# Patient Record
Sex: Male | Born: 1960 | Race: Black or African American | Hispanic: No | Marital: Married | Smoking: Former smoker
Health system: Southern US, Community
[De-identification: ages and names within clinical notes are randomized; demographics above are authoritative.]

## PROBLEM LIST (undated history)

## (undated) DIAGNOSIS — F411 Generalized anxiety disorder: Secondary | ICD-10-CM

## (undated) DIAGNOSIS — I10 Essential (primary) hypertension: Secondary | ICD-10-CM

## (undated) DIAGNOSIS — J449 Chronic obstructive pulmonary disease, unspecified: Secondary | ICD-10-CM

---

## 2002-03-22 ENCOUNTER — Inpatient Hospital Stay (HOSPITAL_COMMUNITY): Admission: AC | Admit: 2002-03-22 | Discharge: 2002-03-24 | Payer: Self-pay

## 2002-03-22 ENCOUNTER — Encounter: Payer: Self-pay | Admitting: General Surgery

## 2002-03-23 ENCOUNTER — Encounter: Payer: Self-pay | Admitting: General Surgery

## 2006-03-02 ENCOUNTER — Emergency Department (HOSPITAL_COMMUNITY): Admission: EM | Admit: 2006-03-02 | Discharge: 2006-03-02 | Payer: Self-pay | Admitting: Emergency Medicine

## 2007-02-19 ENCOUNTER — Emergency Department (HOSPITAL_COMMUNITY): Admission: EM | Admit: 2007-02-19 | Discharge: 2007-02-19 | Payer: Self-pay | Admitting: Family Medicine

## 2010-03-02 ENCOUNTER — Emergency Department (HOSPITAL_COMMUNITY): Admission: EM | Admit: 2010-03-02 | Discharge: 2010-03-03 | Payer: Self-pay | Admitting: Emergency Medicine
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
## 2016-12-13 ENCOUNTER — Emergency Department (HOSPITAL_COMMUNITY): Payer: Self-pay

## 2016-12-13 ENCOUNTER — Encounter (HOSPITAL_COMMUNITY): Payer: Self-pay | Admitting: Emergency Medicine

## 2016-12-13 DIAGNOSIS — R079 Chest pain, unspecified: Secondary | ICD-10-CM | POA: Insufficient documentation

## 2016-12-13 DIAGNOSIS — F17200 Nicotine dependence, unspecified, uncomplicated: Secondary | ICD-10-CM | POA: Insufficient documentation

## 2016-12-13 LAB — CBC
HCT: 42.4 % (ref 39.0–52.0)
HEMOGLOBIN: 14.6 g/dL (ref 13.0–17.0)
MCH: 28.4 pg (ref 26.0–34.0)
MCHC: 34.4 g/dL (ref 30.0–36.0)
MCV: 82.5 fL (ref 78.0–100.0)
PLATELETS: 304 10*3/uL (ref 150–400)
RBC: 5.14 MIL/uL (ref 4.22–5.81)
RDW: 13.6 % (ref 11.5–15.5)
WBC: 9.3 10*3/uL (ref 4.0–10.5)

## 2016-12-13 LAB — BASIC METABOLIC PANEL
ANION GAP: 9 (ref 5–15)
BUN: 10 mg/dL (ref 6–20)
CALCIUM: 8.6 mg/dL — AB (ref 8.9–10.3)
CO2: 25 mmol/L (ref 22–32)
CREATININE: 0.96 mg/dL (ref 0.61–1.24)
Chloride: 106 mmol/L (ref 101–111)
Glucose, Bld: 101 mg/dL — ABNORMAL HIGH (ref 65–99)
Potassium: 3.7 mmol/L (ref 3.5–5.1)
SODIUM: 140 mmol/L (ref 135–145)

## 2016-12-13 LAB — I-STAT TROPONIN, ED: TROPONIN I, POC: 0.01 ng/mL (ref 0.00–0.08)

## 2016-12-13 NOTE — ED Triage Notes (Signed)
Patient reports central chest pain with SOB and productive cough , denies emesis or diaphoresis , no fever or chills .

## 2016-12-14 ENCOUNTER — Emergency Department (HOSPITAL_COMMUNITY)
Admission: EM | Admit: 2016-12-14 | Discharge: 2016-12-14 | Disposition: A | Discharge status: Home / Self Care | Payer: Self-pay | Attending: Emergency Medicine | Admitting: Emergency Medicine

## 2016-12-14 DIAGNOSIS — R079 Chest pain, unspecified: Secondary | ICD-10-CM

## 2016-12-14 MED ORDER — HYDROCODONE-ACETAMINOPHEN 5-325 MG PO TABS
1.0000 | ORAL_TABLET | Freq: Once | ORAL | Status: AC
  Administered 2016-12-14: 1 via ORAL
  Filled 2016-12-14: qty 1

## 2016-12-14 MED ORDER — CYCLOBENZAPRINE HCL 10 MG PO TABS: 10.0000 mg | ORAL_TABLET | Freq: Three times a day (TID) | ORAL | 0 refills | Status: DC | PRN

## 2016-12-14 MED ORDER — CYCLOBENZAPRINE HCL 10 MG PO TABS
5.0000 mg | ORAL_TABLET | Freq: Once | ORAL | Status: AC
  Administered 2016-12-14: 5 mg via ORAL
  Filled 2016-12-14: qty 1

## 2016-12-14 MED ORDER — IBUPROFEN 400 MG PO TABS: 400.0000 mg | ORAL_TABLET | Freq: Three times a day (TID) | ORAL | 0 refills | Status: DC | PRN

## 2016-12-14 NOTE — ED Provider Notes (Signed)
[facility]-EMERGENCY DEPT Provider Note   CSN: [id number] Arrival date & time: 12/13/16  2206   By signing my name below, I, Clovis Pu, attest that this documentation has been prepared under the direction and in the presence of Azalia Bilis, MD  Electronically Signed: Clovis Pu, ED Scribe. 12/14/16. 3:10 AM.   History   Chief Complaint Chief Complaint  Patient presents with  . Chest Pain   The history is provided by the patient. No language interpreter was used.   HPI Comments:  Franklin Chavez is a 56 y.o. male who presents to the Emergency Department complaining of sudden onset, daily, moderate chest pain which radiates up to the left shoulder and left neck x 3 weeks. His pain is worse upon palpation and when he raises his left arm. He has taken ibuprofen with no relief. Pt denies any recent injury. No other associated symptoms noted.    History reviewed. No pertinent past medical history.  There are no active problems to display for this patient.   History reviewed. No pertinent surgical history.   Home Medications    Prior to Admission medications   Not on File    Family History No family history on file.  Social History Social History  Substance Use Topics  . Smoking status: Current Every Day Smoker  . Smokeless tobacco: Never Used  . Alcohol use No     Allergies   Patient has no known allergies.   Review of Systems Review of Systems 10 systems reviewed and all are negative for acute change except as noted in the HPI.  Physical Exam Updated Vital Signs BP 141/89 (BP Location: Left Arm)   Pulse 64   Temp 98.4 F (36.9 C) (Oral)   Resp 16   SpO2 94%   Physical Exam  Constitutional: He is oriented to person, place, and time. He appears well-developed and well-nourished.  HENT:  Head: Normocephalic and atraumatic.  Eyes: EOM are normal.  Neck: Normal range of motion.  Cardiovascular: Normal rate, regular rhythm, normal heart sounds and intact  distal pulses.   Pulmonary/Chest: Effort normal and breath sounds normal. No respiratory distress.  Abdominal: Soft. He exhibits no distension. There is no tenderness.  Musculoskeletal: Normal range of motion.  Neurological: He is alert and oriented to person, place, and time.  Skin: Skin is warm and dry.  Psychiatric: He has a normal mood and affect. Judgment normal.  Nursing note and vitals reviewed.  ED Treatments / Results  DIAGNOSTIC STUDIES:  Oxygen Saturation is 94% on RA, normal by my interpretation.    COORDINATION OF CARE:  3:09 AM Discussed treatment plan with pt at bedside and pt agreed to plan.  Labs (all labs ordered are listed, but only abnormal results are displayed) Labs Reviewed  BASIC METABOLIC PANEL - Abnormal; Notable for the following:       Result Value   Glucose, Bld 101 (*)    Calcium 8.6 (*)    All other components within normal limits  CBC  I-STAT TROPOININ, ED    EKG  EKG Interpretation None       Radiology Dg Chest 2 View  Result Date: 12/13/2016 CLINICAL DATA:  Cough and shortness of breath EXAM: CHEST  2 VIEW COMPARISON:  None. FINDINGS: The lungs are well inflated. Cardiomediastinal contours are normal. No pneumothorax or pleural effusion. No focal airspace consolidation or pulmonary edema. IMPRESSION: Clear lungs. Electronically Signed   By: Deatra Robinson M.D.   On: 12/13/2016 22:31  Procedures Procedures (including critical care time)  Medications Ordered in ED Medications - No data to display   Initial Impression / Assessment and Plan / ED Course  I have reviewed the triage vital signs and the nursing notes.  Pertinent labs & imaging results that were available during my care of the patient were reviewed by me and considered in my medical decision making (see chart for details).  Clinical Course     Patient's pain seems more muscular and that is worse with movement of his left arm.  Normal left radial pulse.  Doubt ACS.   Doubt PE.  Well-appearing.  Home with muscle relaxants and anti-inflammatories.  Primary care follow-up.  He understands return to the ER for new or worsening symptoms.  Final Clinical Impressions(s) / ED Diagnoses   Final diagnoses:  Chest pain, unspecified type    New Prescriptions New Prescriptions   CYCLOBENZAPRINE (FLEXERIL) 10 MG TABLET    Take 1 tablet (10 mg total) by mouth 3 (three) times daily as needed for muscle spasms.   IBUPROFEN (ADVIL,MOTRIN) 400 MG TABLET    Take 1 tablet (400 mg total) by mouth every 8 (eight) hours as needed.   I personally performed the services described in this documentation, which was scribed in my presence. The recorded information has been reviewed and is accurate.        Azalia BilisKevin Emillie Chasen, MD 12/14/16 [phone]

## 2018-02-20 IMAGING — DX DG CHEST 2V
2 series · 2 of 2 positions shown · non-contrast
Comparison: None.

CLINICAL DATA: Cough and shortness of breath

EXAM:
CHEST  2 VIEW

[chest pa]
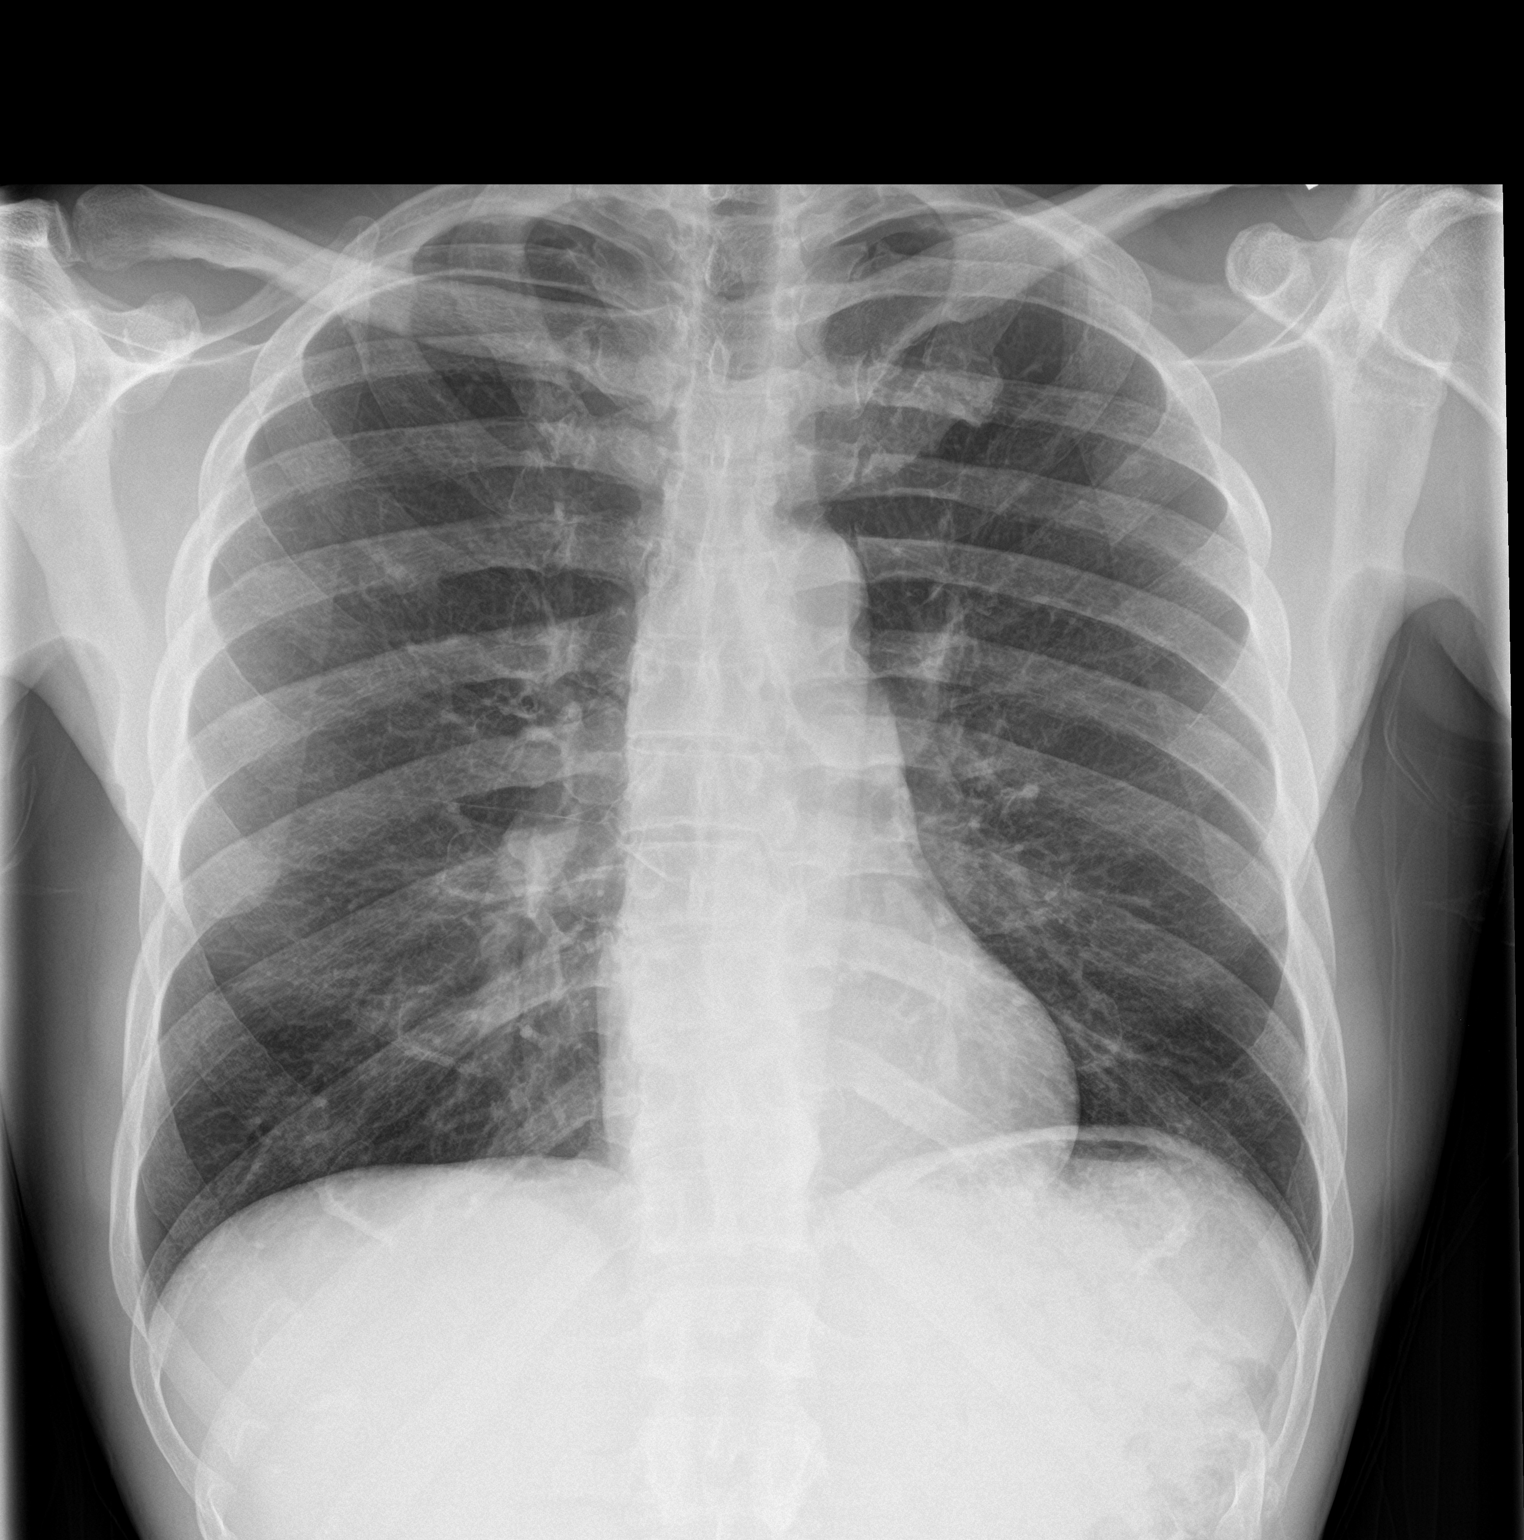

[chest lat]
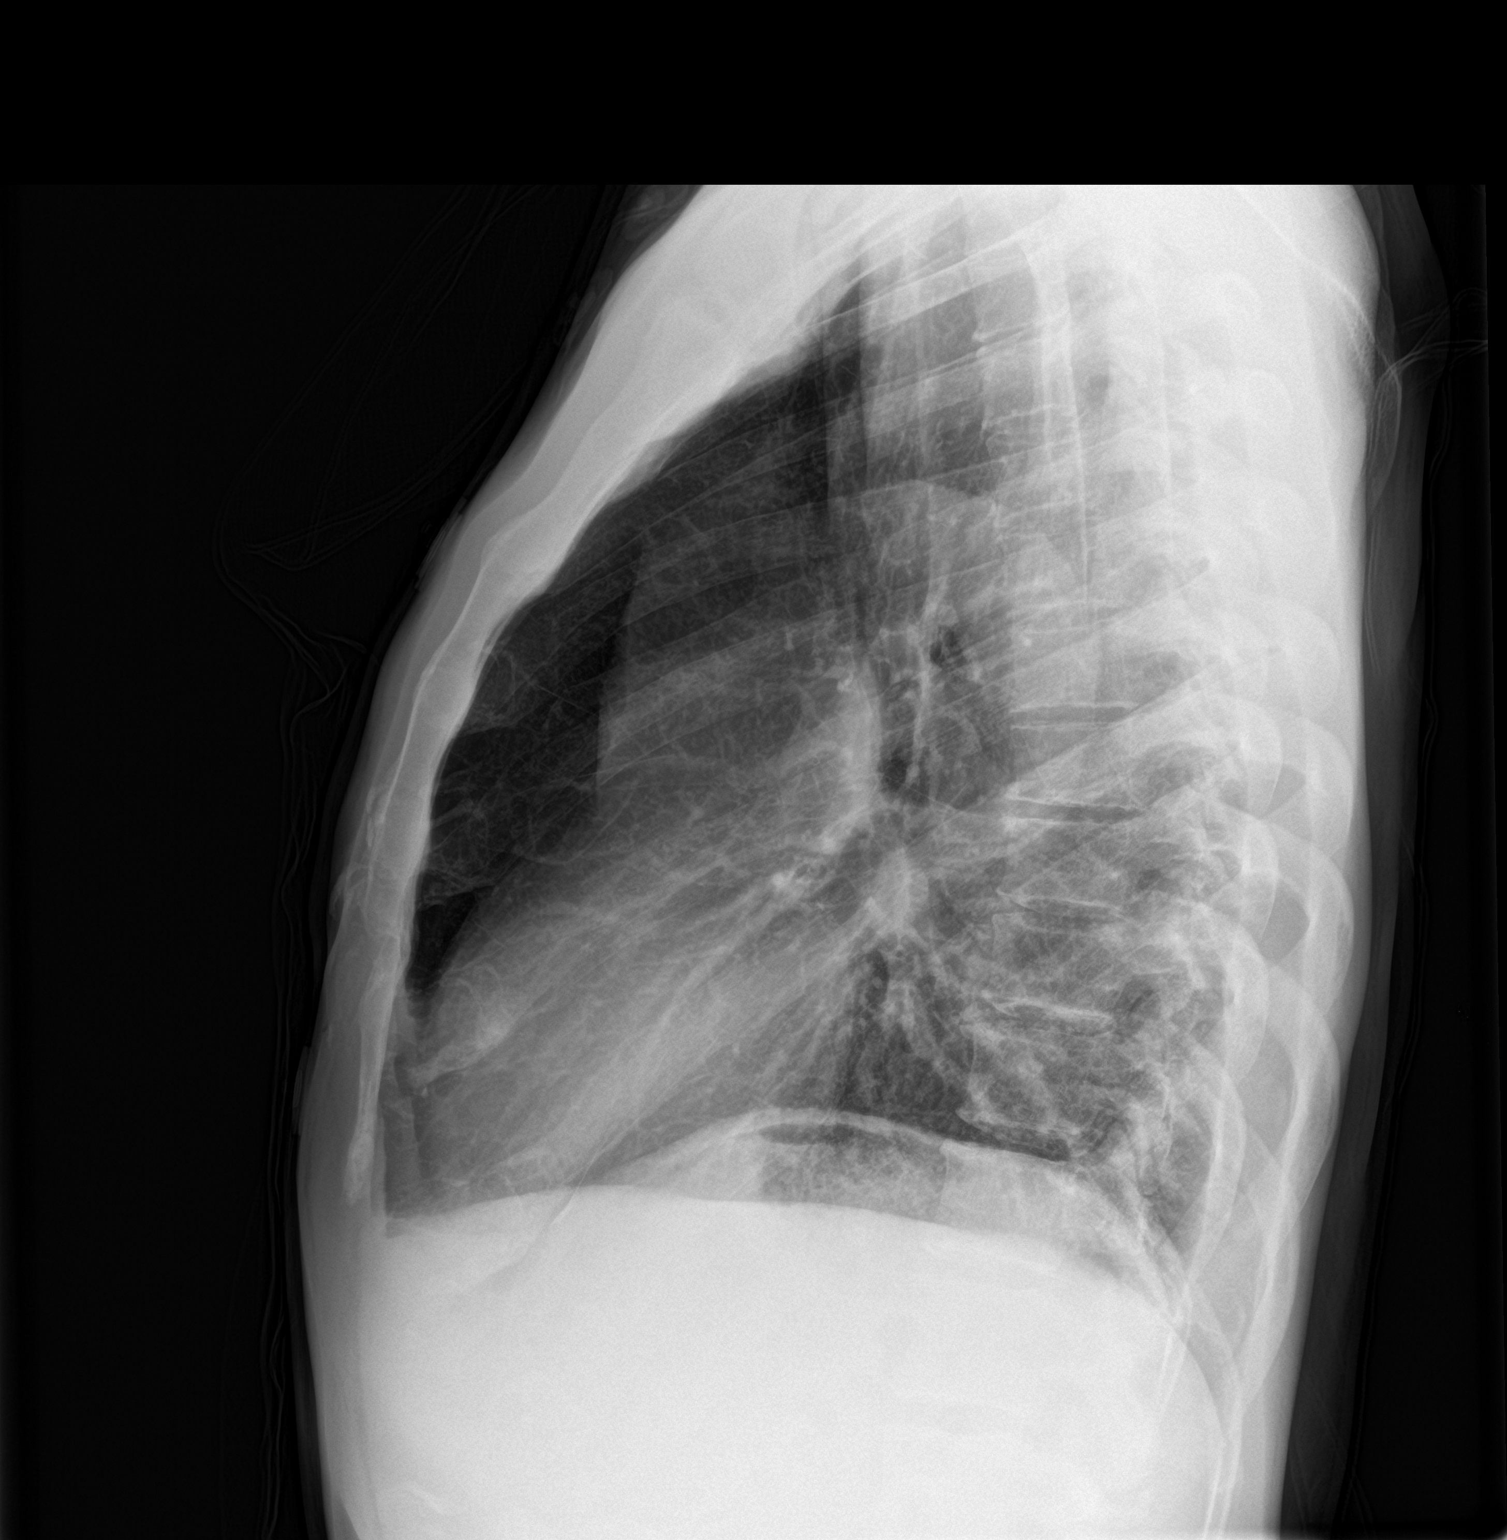

[2 of 2 positions shown; findings below may reference images not displayed]

FINDINGS: The lungs are well inflated. Cardiomediastinal contours are normal.
No pneumothorax or pleural effusion. No focal airspace consolidation
or pulmonary edema.
IMPRESSION: Clear lungs.

## 2018-12-10 ENCOUNTER — Other Ambulatory Visit: Payer: Self-pay

## 2018-12-10 ENCOUNTER — Inpatient Hospital Stay (HOSPITAL_COMMUNITY)
Admission: EM | Admit: 2018-12-10 | Discharge: 2018-12-14 | DRG: 479 | Disposition: A | Discharge status: Home / Self Care | Payer: Self-pay | Attending: Orthopedic Surgery | Admitting: Orthopedic Surgery

## 2018-12-10 ENCOUNTER — Emergency Department (HOSPITAL_COMMUNITY): Payer: Self-pay

## 2018-12-10 DIAGNOSIS — S60453A Superficial foreign body of left middle finger, initial encounter: Secondary | ICD-10-CM

## 2018-12-10 DIAGNOSIS — Z23 Encounter for immunization: Secondary | ICD-10-CM

## 2018-12-10 DIAGNOSIS — K089 Disorder of teeth and supporting structures, unspecified: Secondary | ICD-10-CM

## 2018-12-10 DIAGNOSIS — F17200 Nicotine dependence, unspecified, uncomplicated: Secondary | ICD-10-CM | POA: Diagnosis present

## 2018-12-10 DIAGNOSIS — Z720 Tobacco use: Secondary | ICD-10-CM

## 2018-12-10 DIAGNOSIS — L089 Local infection of the skin and subcutaneous tissue, unspecified: Secondary | ICD-10-CM

## 2018-12-10 DIAGNOSIS — Z716 Tobacco abuse counseling: Secondary | ICD-10-CM

## 2018-12-10 DIAGNOSIS — B9562 Methicillin resistant Staphylococcus aureus infection as the cause of diseases classified elsewhere: Secondary | ICD-10-CM | POA: Diagnosis present

## 2018-12-10 DIAGNOSIS — M868X4 Other osteomyelitis, hand: Principal | ICD-10-CM | POA: Diagnosis present

## 2018-12-10 LAB — CBC WITH DIFFERENTIAL/PLATELET
Abs Immature Granulocytes: 0.04 10*3/uL (ref 0.00–0.07)
Basophils Absolute: 0.1 10*3/uL (ref 0.0–0.1)
Basophils Relative: 0 %
Eosinophils Absolute: 0.6 10*3/uL — ABNORMAL HIGH (ref 0.0–0.5)
Eosinophils Relative: 5 %
HCT: 43.9 % (ref 39.0–52.0)
Hemoglobin: 14.9 g/dL (ref 13.0–17.0)
Immature Granulocytes: 0 %
Lymphocytes Relative: 17 %
Lymphs Abs: 2 10*3/uL (ref 0.7–4.0)
MCH: 28.1 pg (ref 26.0–34.0)
MCHC: 33.9 g/dL (ref 30.0–36.0)
MCV: 82.7 fL (ref 80.0–100.0)
Monocytes Absolute: 0.8 10*3/uL (ref 0.1–1.0)
Monocytes Relative: 7 %
Neutro Abs: 8.5 10*3/uL — ABNORMAL HIGH (ref 1.7–7.7)
Neutrophils Relative %: 71 %
Platelets: 313 10*3/uL (ref 150–400)
RBC: 5.31 MIL/uL (ref 4.22–5.81)
RDW: 14.4 % (ref 11.5–15.5)
WBC: 11.9 10*3/uL — ABNORMAL HIGH (ref 4.0–10.5)
nRBC: 0 % (ref 0.0–0.2)

## 2018-12-10 LAB — C-REACTIVE PROTEIN: CRP: 1.9 mg/dL — ABNORMAL HIGH (ref <1.0)

## 2018-12-10 LAB — BASIC METABOLIC PANEL
Anion gap: 4 — ABNORMAL LOW (ref 5–15)
BUN: 8 mg/dL (ref 6–20)
CO2: 28 mmol/L (ref 22–32)
Calcium: 8.3 mg/dL — ABNORMAL LOW (ref 8.9–10.3)
Chloride: 106 mmol/L (ref 98–111)
Creatinine, Ser: 0.88 mg/dL (ref 0.61–1.24)
GFR calc Af Amer: >60 mL/min (ref >60)
GFR calc non Af Amer: >60 mL/min (ref >60)
Glucose, Bld: 97 mg/dL (ref 70–99)
Potassium: 4.3 mmol/L (ref 3.5–5.1)
Sodium: 138 mmol/L (ref 135–145)

## 2018-12-10 LAB — SEDIMENTATION RATE: Sed Rate: 3 mm/hr (ref 0–16)

## 2018-12-10 MED ORDER — PIPERACILLIN-TAZOBACTAM 3.375 G IVPB
3.3750 g | Freq: Three times a day (TID) | INTRAVENOUS | Status: DC
  Administered 2018-12-11 (×2): 3.375 g via INTRAVENOUS
  Filled 2018-12-10 (×3): qty 50

## 2018-12-10 MED ORDER — VANCOMYCIN HCL 10 G IV SOLR
1500.0000 mg | Freq: Once | INTRAVENOUS | Status: AC
  Administered 2018-12-10: 1500 mg via INTRAVENOUS
  Filled 2018-12-10: qty 1500

## 2018-12-10 MED ORDER — OXYCODONE HCL 5 MG PO TABS
5.0000 mg | ORAL_TABLET | ORAL | Status: DC | PRN
  Administered 2018-12-10 – 2018-12-14 (×22): 10 mg via ORAL
  Filled 2018-12-10 (×22): qty 2

## 2018-12-10 MED ORDER — MORPHINE SULFATE (PF) 2 MG/ML IV SOLN: 1.0000 mg | INTRAVENOUS | Status: DC | PRN

## 2018-12-10 MED ORDER — TETANUS-DIPHTH-ACELL PERTUSSIS 5-2.5-18.5 LF-MCG/0.5 IM SUSP
0.5000 mL | Freq: Once | INTRAMUSCULAR | Status: AC
  Administered 2018-12-10: 0.5 mL via INTRAMUSCULAR
  Filled 2018-12-10: qty 0.5

## 2018-12-10 MED ORDER — METHOCARBAMOL 500 MG PO TABS
500.0000 mg | ORAL_TABLET | Freq: Four times a day (QID) | ORAL | Status: DC | PRN
  Administered 2018-12-11 – 2018-12-13 (×4): 500 mg via ORAL
  Filled 2018-12-10 (×4): qty 1

## 2018-12-10 MED ORDER — METHOCARBAMOL 1000 MG/10ML IJ SOLN
500.0000 mg | Freq: Four times a day (QID) | INTRAVENOUS | Status: DC | PRN
  Filled 2018-12-10: qty 5

## 2018-12-10 MED ORDER — PIPERACILLIN-TAZOBACTAM 3.375 G IVPB 30 MIN
3.3750 g | Freq: Once | INTRAVENOUS | Status: AC
  Administered 2018-12-10: 3.375 g via INTRAVENOUS
  Filled 2018-12-10: qty 50

## 2018-12-10 MED ORDER — VANCOMYCIN HCL 10 G IV SOLR
1500.0000 mg | Freq: Two times a day (BID) | INTRAVENOUS | Status: DC
  Administered 2018-12-11 – 2018-12-14 (×7): 1500 mg via INTRAVENOUS
  Filled 2018-12-10 (×9): qty 1500

## 2018-12-10 MED ORDER — ALPRAZOLAM 0.5 MG PO TABS: 0.5000 mg | ORAL_TABLET | Freq: Four times a day (QID) | ORAL | Status: DC | PRN

## 2018-12-10 MED ORDER — LACTATED RINGERS IV SOLN
INTRAVENOUS | Status: DC
  Administered 2018-12-10 – 2018-12-13 (×2): via INTRAVENOUS

## 2018-12-10 MED ORDER — DOCUSATE SODIUM 100 MG PO CAPS
100.0000 mg | ORAL_CAPSULE | Freq: Two times a day (BID) | ORAL | Status: DC
  Administered 2018-12-10 – 2018-12-11 (×3): 100 mg via ORAL
  Filled 2018-12-10 (×6): qty 1

## 2018-12-10 MED ORDER — HYDROCODONE-ACETAMINOPHEN 5-325 MG PO TABS
1.0000 | ORAL_TABLET | Freq: Once | ORAL | Status: AC
  Administered 2018-12-10: 1 via ORAL
  Filled 2018-12-10: qty 1

## 2018-12-10 MED ORDER — FAMOTIDINE 20 MG PO TABS
20.0000 mg | ORAL_TABLET | Freq: Two times a day (BID) | ORAL | Status: DC | PRN
Start: 2018-12-10 — End: 2018-12-14

## 2018-12-10 MED ORDER — VITAMIN C 500 MG PO TABS
1000.0000 mg | ORAL_TABLET | Freq: Every day | ORAL | Status: DC
  Administered 2018-12-11 – 2018-12-14 (×4): 1000 mg via ORAL
  Filled 2018-12-10 (×4): qty 2

## 2018-12-10 NOTE — ED Notes (Signed)
This RN attempted to call report, will try again in a few.

## 2018-12-10 NOTE — H&P (Signed)
SHAHRIAR BRETTSCHNEIDER is an 58 y.o. male.   Chief Complaint: Left middle finger deep infection HPI: Patient is a 58 year old male who has a greater than 1 week history of a deep infection about the left middle finger.  The patient states that he has tremendous pain and had some type of inoculation or insult occurred to the ulnar border of the DIP region approximately 1 week or greater ago  He denies other injury.  He denies neck back chest or abdominal pain.  X-rays do not show an obvious fracture or bone insult.  The patient has no evidence of erosive change or fracture.  He does have a swollen and very infected looking left middle finger.    No past medical history on file.  No past surgical history on file.  No family history on file. Social History:  reports that he has been smoking. He has never used smokeless tobacco. He reports that he does not drink alcohol or use drugs.  Allergies: No Known Allergies  (Not in a hospital admission)   No results found for this or any previous visit (from the past 48 hour(s)). Dg Finger Middle Left  Result Date: 12/10/2018 CLINICAL DATA:  Left third digit pain and swelling, possible foreign body EXAM: LEFT MIDDLE FINGER 2+V COMPARISON:  None. FINDINGS: Generalized soft tissue swelling in the third digit is noted. No bony erosive changes are seen. No acute fracture or dislocation is noted. By history there is suggestion of a foreign body although no radiopaque density is seen. IMPRESSION: Soft tissue swelling without acute bony abnormality or definitive radiopaque foreign body. Electronically Signed   By: Alcide Clever M.D.   On: 12/10/2018 16:14    Review of Systems  Respiratory: Negative.   Cardiovascular: Negative.   Gastrointestinal: Negative.   Genitourinary: Negative.     Blood pressure (!) 141/94, pulse 71, temperature 98.2 F (36.8 C), temperature source Oral, resp. rate 16, SpO2 94 %. Physical Exam left middle finger deep abscess  swelling necrotic tissue primarily ulnarly.  He has no evidence of instability x-rays are negative.  The patient is alert and oriented in no acute distress. The patient complains of pain in the affected upper extremity.  The patient is noted to have a normal HEENT exam. Lung fields show equal chest expansion and no shortness of breath. Abdomen exam is nontender without distention. Lower extremity examination does not show any fracture dislocation or blood clot symptoms. Pelvis is stable and the neck and back are stable and nontender.  Assessment/Plan Left middle finger deep infection.  Procedure patient was taken to the procedural suite.  He underwent a 15 cc lidocaine without epinephrine block followed by 2 separate scrubs of Hibiclens followed by Betadine scrub and paint.  Once this was complete outlined marks were made and I performed a incision about the left middle finger this was a incision off of the ulnar border about the pulp tissue where there is devitalized and necrotic skin I immediately encountered deep infectious material which was cultured for aerobic and anaerobic culture.  This was irrigation and debridement of a deep abscess.  Following this I then performed evacuation of nonviable tissue.  I then performed partial nail plate removal.  I then debrided the nail bed.  There is a small area of bone encroachment where the periosteum was rather worn.  Although the x-rays did not show an obvious erosive issue the infection was right against the bone and thus I performed a bone biopsy and  cultured this separately as well.  The patient tolerated this nicely.  There were no complications.  We irrigated with greater than a liter of saline and following this packed this open.  He will be treated presumptively on IV antibiotics in the form of vancomycin and Zosyn while we await final cultures.  I will plan for PT hydrotherapy and instruct him on wet-to-dry dressing changes.  I would try to  treat him presumptively for a 6-week course of care on oral antibiotics if appropriate.  All questions have been addressed.  Oletta Cohn III, MD 12/10/2018, 5:17 PM

## 2018-12-10 NOTE — ED Provider Notes (Signed)
[place] EMERGENCY DEPARTMENT Provider Note   CSN: [id number] Arrival date & time: 12/10/18  1403     History   Chief Complaint Chief Complaint  Patient presents with  . Hand Pain    HPI HURBERT DURAN is a 58 y.o. male with no significant past medical history presenting for evaluation of acute onset, progressively worsening left third digit pain and swelling for 1 week.  He states he thinks he may have a splinter in there but is unsure how he may have acquired this and denies any known trauma.  He states that he has tried to "dig it out "with a metal pin unsuccessfully.  Pain is constant, throbbing, worsens with movement and palpation.  Also endorses intermittent numbness and tingling of the digit secondary to swelling.  Denies fevers, vomiting, diaphoresis.  He is unsure if his tetanus is up-to-date.  The history is provided by the patient.    No past medical history on file.  Patient Active Problem List   Diagnosis Date Noted  . Foreign body of left middle finger with infection 12/10/2018    No past surgical history on file.      Home Medications    Prior to Admission medications   Medication Sig Start Date End Date Taking? Authorizing Provider  cyclobenzaprine (FLEXERIL) 10 MG tablet Take 1 tablet (10 mg total) by mouth 3 (three) times daily as needed for muscle spasms. 12/14/16   Jola Schmidt, MD  ibuprofen (ADVIL,MOTRIN) 400 MG tablet Take 1 tablet (400 mg total) by mouth every 8 (eight) hours as needed. 12/14/16   Jola Schmidt, MD    Family History No family history on file.  Social History Social History   Tobacco Use  . Smoking status: Current Every Day Smoker  . Smokeless tobacco: Never Used  Substance Use Topics  . Alcohol use: No  . Drug use: No     Allergies   Patient has no known allergies.   Review of Systems Review of Systems  Constitutional: Negative for chills and fever.  Gastrointestinal: Negative for nausea and  vomiting.  Musculoskeletal: Positive for arthralgias.  Neurological: Positive for numbness. Negative for weakness.     Physical Exam Updated Vital Signs BP (!) 141/94 (BP Location: Right Arm)   Pulse 71   Temp 98.2 F (36.8 C) (Oral)   Resp 16   Ht _0  (1.905 m)   Wt 79.4 kg   SpO2 94%   BMI 21.87 kg/m   Physical Exam Vitals signs and nursing note reviewed.  Constitutional:      General: He is not in acute distress.    Appearance: He is well-developed.  HENT:     Head: Normocephalic and atraumatic.  Eyes:     General:        Right eye: No discharge.        Left eye: No discharge.     Conjunctiva/sclera: Conjunctivae normal.  Neck:     Vascular: No JVD.     Trachea: No tracheal deviation.  Cardiovascular:     Rate and Rhythm: Normal rate.     Pulses: Normal pulses.     Comments: 2+ radial pulses bilaterally Pulmonary:     Effort: Pulmonary effort is normal.  Abdominal:     General: There is no distension.  Musculoskeletal:        General: Swelling and tenderness present.     Comments: See below images.  Diffuse tenderness to palpation of the left third  digit, worse distally.  He has a wound to the distal fingertip.  No involvement of the nail.  He has pain with passive extension of the digit.  No crepitus noted.  5/5 strength of bilateral wrist and digits with flexion and extension against resistance however somewhat limited on examination of the left third digit due to pain.  Skin:    General: Skin is warm and dry.     Capillary Refill: Capillary refill takes less than 2 seconds.     Findings: No erythema.  Neurological:     Mental Status: He is alert.     Comments: Fluent speech, no facial droop, slightly altered sensation to soft touch of the left third digit as compared to the other digits.  Psychiatric:        Behavior: Behavior normal.          ED Treatments / Results  Labs (all labs ordered are listed, but only abnormal results are  displayed) Labs Reviewed  CBC WITH DIFFERENTIAL/PLATELET - Abnormal; Notable for the following components:      Result Value   WBC 11.9 (*)    Neutro Abs 8.5 (*)    Eosinophils Absolute 0.6 (*)    All other components within normal limits  BASIC METABOLIC PANEL - Abnormal; Notable for the following components:   Calcium 8.3 (*)    Anion gap 4 (*)    All other components within normal limits  AEROBIC/ANAEROBIC CULTURE (SURGICAL/DEEP WOUND)  AEROBIC CULTURE (SUPERFICIAL SPECIMEN)  SEDIMENTATION RATE  C-REACTIVE PROTEIN  HIV ANTIBODY (ROUTINE TESTING W REFLEX)    EKG None  Radiology Dg Finger Middle Left  Result Date: 12/10/2018 CLINICAL DATA:  Left third digit pain and swelling, possible foreign body EXAM: LEFT MIDDLE FINGER 2+V COMPARISON:  None. FINDINGS: Generalized soft tissue swelling in the third digit is noted. No bony erosive changes are seen. No acute fracture or dislocation is noted. By history there is suggestion of a foreign body although no radiopaque density is seen. IMPRESSION: Soft tissue swelling without acute bony abnormality or definitive radiopaque foreign body. Electronically Signed   By: Inez Catalina M.D.   On: 12/10/2018 16:14    Procedures Procedures (including critical care time)  Medications Ordered in ED Medications  lactated ringers infusion (has no administration in time range)  oxyCODONE (Oxy IR/ROXICODONE) immediate release tablet 5-10 mg (has no administration in time range)  morphine 2 MG/ML injection 1 mg (has no administration in time range)  ALPRAZolam (XANAX) tablet 0.5 mg (has no administration in time range)  famotidine (PEPCID) tablet 20 mg (has no administration in time range)  vitamin C (ASCORBIC ACID) tablet 1,000 mg (has no administration in time range)  methocarbamol (ROBAXIN) tablet 500 mg (has no administration in time range)    Or  methocarbamol (ROBAXIN) 500 mg in dextrose 5 % 50 mL IVPB (has no administration in time range)   docusate sodium (COLACE) capsule 100 mg (has no administration in time range)  piperacillin-tazobactam (ZOSYN) IVPB 3.375 g (has no administration in time range)  vancomycin (VANCOCIN) 1,500 mg in sodium chloride 0.9 % 500 mL IVPB (has no administration in time range)  Tdap (BOOSTRIX) injection 0.5 mL (0.5 mLs Intramuscular Given 12/10/18 1544)  HYDROcodone-acetaminophen (NORCO/VICODIN) 5-325 MG per tablet 1 tablet (1 tablet Oral Given 12/10/18 1544)     Initial Impression / Assessment and Plan / ED Course  I have reviewed the triage vital signs and the nursing notes.  Pertinent labs & imaging results that  were available during my care of the patient were reviewed by me and considered in my medical decision making (see chart for details).     Patient presenting for evaluation of pain and swelling to the left third digit for 1 week.  He is afebrile, mildly hypertensive.  He is nontoxic in appearance.  Slightly altered sensation to soft touch of the digit.  Radiographs show soft tissue swelling without evidence of acute bony abnormality or radiopaque foreign body.  Differential includes flexor tenosynovitis, feeling, paronychia, osteomyelitis.  Spoke with Hilbert Odor with orthopedics who will follow-up and assess the patient.  Patient seen and evaluate by Dr. Amedeo Plenty who performed bedside I&D.  He has concern for osteomyelitis.  Will obtain lab work and plan for admission for further evaluation. Wound culture obtained.  Lab work significant for leukocytosis, no metabolic derangements. Awaiting ESR/CRP.   Final Clinical Impressions(s) / ED Diagnoses   Final diagnoses:  Finger infection    ED Discharge Orders    None       Debroah Baller 12/10/18 1754    Margette Fast, MD 12/10/18 2047

## 2018-12-10 NOTE — ED Notes (Signed)
Patient transported to X-ray 

## 2018-12-10 NOTE — ED Notes (Signed)
Pt given turkey sandwich and sprite.  

## 2018-12-10 NOTE — ED Triage Notes (Signed)
Pt has pain and swelling in left middle finger appears to have FB in finger beside nail bed.

## 2018-12-10 NOTE — Progress Notes (Signed)
Pharmacy Antibiotic Note  Franklin Chavez is a 58 y.o. male admitted on 12/10/2018 with abscess.  Pharmacy has been consulted for vancomycin/zosyn dosing.  Presenting with L middle finger deep infection. During I&D, infection appeared to be right against bone and bone biopsy/culture was performed. WBC 11.9, sed rate 3, CRP 1.9. Scr 0.88 (CrCl >100 mL/min).  Plan: Vancomycin 1500 mg IV every 12 hours.  Goal trough 15-20 mcg/mL. Zosyn 3.375g IV q8h (4 hour infusion). Monitor clinical pic, cx results, and levels as appropriate  Height: 6\' 3"  (190.5 cm) Weight: 175 lb (79.4 kg) IBW/kg (Calculated) : 84.5  Temp (24hrs), Avg:98.2 F (36.8 C), Min:98.2 F (36.8 C), Max:98.2 F (36.8 C)  Recent Labs  Lab 12/10/18 1703  WBC 11.9*    CrCl cannot be calculated (Patient's most recent lab result is older than the maximum 21 days allowed.).    No Known Allergies  Antimicrobials this admission: Vancomycin 1/7 >>  Zosyn 1/7 >>   Dose adjustments this admission: N/A  Microbiology results: 1/7 Wound Cx: sent  Thank you for allowing pharmacy to be a part of this patient's care.  Sherron Monday, PharmD, BCCCP Clinical Pharmacist  Pager: [phone] Phone: [phone] 12/10/2018 5:36 PM

## 2018-12-11 ENCOUNTER — Encounter (HOSPITAL_COMMUNITY): Payer: Self-pay | Admitting: *Deleted

## 2018-12-11 LAB — HIV ANTIBODY (ROUTINE TESTING W REFLEX): HIV Screen 4th Generation wRfx: NONREACTIVE

## 2018-12-11 NOTE — Progress Notes (Signed)
Physical Therapy Wound Treatment Patient Details  Name: SHERMAR FRIEDLAND MRN: [id number] Date of Birth: 1961/04/22  Today's Date: 12/11/2018 Time: 4970-2637 Time Calculation (min): 50 min  Subjective  Subjective: "That wasn't too bad." Patient and Family Stated Goals: get finger better Prior Treatments: I&D in ED  Pain Score: Pain Score: 7 Premedicated  Wound Assessment  Wound / Incision (Open or Dehisced) 12/11/18 Incision - Open Finger (Comment which one) Left lt middle finger (Active)  Wound Image   12/11/2018 11:00 AM  Dressing Type Gauze (Comment);Compression wrap;Moist to moist 12/11/2018 11:00 AM  Dressing Changed Changed 12/11/2018 11:00 AM  Dressing Status Clean;Dry;Intact 12/11/2018 11:00 AM  Dressing Change Frequency Daily 12/11/2018 11:00 AM  Site / Wound Assessment Red;Pink;Yellow;Painful 12/11/2018 11:00 AM  % Wound base Red or Granulating 50% 12/11/2018 11:00 AM  % Wound base Yellow/Fibrinous Exudate 50% 12/11/2018 11:00 AM  % Wound base Black/Eschar 0% 12/11/2018 11:00 AM  % Wound base Other/Granulation Tissue (Comment) 0% 12/11/2018 11:00 AM  Peri-wound Assessment Edema 12/11/2018 11:00 AM  Wound Length (cm) 1.5 cm 12/11/2018 11:00 AM  Wound Width (cm) 0.5 cm 12/11/2018 11:00 AM  Wound Depth (cm) 0.3 cm 12/11/2018 11:00 AM  Wound Volume (cm^3) 0.22 cm^3 12/11/2018 11:00 AM  Wound Surface Area (cm^2) 0.75 cm^2 12/11/2018 11:00 AM  Margins Unattached edges (unapproximated) 12/11/2018 11:00 AM  Closure None 12/11/2018 11:00 AM  Drainage Amount Moderate 12/11/2018 11:00 AM  Drainage Description Sanguineous 12/11/2018 11:00 AM  Non-staged Wound Description Full thickness 12/11/2018 11:00 AM  Treatment Hydrotherapy (Pulse lavage);Packing (Saline gauze) 12/11/2018 11:00 AM   Hydrotherapy Pulsed lavage therapy - wound location: lt middle finger Pulsed Lavage with Suction (psi): 4 psi Pulsed Lavage with Suction - Normal Saline Used: 500 mL Pulsed Lavage Tip: Tip with splash shield   Wound Assessment and Plan    Wound Therapy - Assess/Plan/Recommendations Wound Therapy - Clinical Statement: Pt presents to hydrotherapy with open wound on lt middle finger after bedside debridement by hand surgery. Hydrotherapy to decr bioburden and facilitate removal of nonviable tissue.  Wound Therapy - Functional Problem List: decr use of lt hand Factors Delaying/Impairing Wound Healing: Infection - systemic/local;Tobacco use Hydrotherapy Plan: Debridement;Dressing change;Patient/family education;Pulsatile lavage with suction Wound Therapy - Frequency: Other (comment)(daily) Wound Therapy - Follow Up Recommendations: Other (comment)(per hand surgeon recommendations) Wound Plan: See above  Wound Therapy Goals- Improve the function of patient's integumentary system by progressing the wound(s) through the phases of wound healing (inflammation - proliferation - remodeling) by: Decrease Necrotic Tissue to: 30 Decrease Necrotic Tissue - Progress: Goal set today Increase Granulation Tissue to: 70 Increase Granulation Tissue - Progress: Goal set today Goals/treatment plan/discharge plan were made with and agreed upon by patient/family: Yes Time For Goal Achievement: 7 days Wound Therapy - Potential for Goals: Fair  Goals will be updated until maximal potential achieved or discharge criteria met.  Discharge criteria: when goals achieved, discharge from hospital, MD decision/surgical intervention, no progress towards goals, refusal/missing three consecutive treatments without notification or medical reason.  GP     Shary Decamp [place] 12/11/2018, 12:19 PM [place] Pager [phone] Office [phone]

## 2018-12-11 NOTE — Progress Notes (Signed)
Pharmacy Antibiotic Note  Franklin Chavez is a 58 y.o. male admitted on 12/10/2018 with abscess.  He was started on vanc/zosyn. His deep culture has come back with staph aureus with sens pending. D/w Dr. Amanda Pea to stop zosyn and cont vanc until sens are back for oral therapy.   Plan: Continue vanc 1.5g IV q12 for now Dc zosyn  Height: 6\' 3"  (190.5 cm) Weight: 175 lb 0.7 oz (79.4 kg) IBW/kg (Calculated) : 84.5  Temp (24hrs), Avg:98.4 F (36.9 C), Min:98 F (36.7 C), Max:98.7 F (37.1 C)  Recent Labs  Lab 12/10/18 1703  WBC 11.9*  CREATININE 0.88    Estimated Creatinine Clearance: 105.3 mL/min (by C-G formula based on SCr of 0.88 mg/dL).    No Known Allergies  Antimicrobials this admission: Vancomycin 1/7 >>  Zosyn 1/7 >> 1/8  Dose adjustments this admission: N/A  Microbiology results: 1/7 Wound Cx: staph aureus  Franklin Chavez, PharmD, BCIDP, AAHIVP, CPP Infectious Disease Pharmacist 12/11/2018 2:05 PM

## 2018-12-11 NOTE — Progress Notes (Signed)
Patient ID: KADEYN TSUNG, male   DOB: [birth year], 58 y.o.   MRN: [id number] Patient is alert and oriented.  Patient had whirlpool today.  There is no complicating features today.  Overall I think the patient is quite stable.    We will tailor his antibiotics to simply vancomycin given the early culture results.  We have discussed with the patient the issues regarding their infection to the extremity. We will continue antibiotics and await culture results. Often times it will take 3-5 days for cultures to become final. During this time we will typically have the patient on intravenous antibiotics until we can find a parenteral route of antibiotic regime specific for the bacteria or organism isolated. We have discussed with the patient the need for daily irrigation and debridement as well as therapy to the area. We have discussed with the patient the necessity of range of motion to the involved joints as discussed today. We have discussed with the patient the unpredictability of infections at times. We'll continue to work towards good pain control and restoration of function. The patient understands the need for meticulous wound care and the necessity of proper followup.  The possible complications of stiffness (loss of motion), resistant infection, possible deep bone infection, possible chronic pain issues, possible need for multiple surgeries and even amputation.  With this in mind the patient understands our goal is to eradicate the infection to quiesence. We will continue to work towards these goals.   The patient is alert and oriented in no acute distress. The patient complains of pain in the affected upper extremity.  The patient is noted to have a normal HEENT exam. Lung fields show equal chest expansion and no shortness of breath. Abdomen exam is nontender without distention. Lower extremity examination does not show any fracture dislocation or blood clot symptoms. Pelvis is stable and the  neck and back are stable and nontender.   Our plan will be whirlpool Thursday and Friday.  Will await the cultures and sensitivities and hopefully home Friday afternoon.  Patient is aware of all issues.  Shimshon Narula MD

## 2018-12-11 NOTE — Care Management Note (Signed)
Case Management Note  Patient Details  Name: Franklin Chavez MRN: [id number] Date of Birth: 1961-01-09  Subjective/Objective:    Pt admitted with lt middle finger infection. He is from home with a roommate.  DME: none Insurance: none PCP: none                Action/Plan: CM met with the patient and he is interested in getting into one of the [employer]. CM was able to get an appt at the [facility]. Information on the AVS. Pt provided information on using [facility] pharmacy for assist with his medications. CM following for further d/c needs.   Expected Discharge Date:                  Expected Discharge Plan:     In-House Referral:     Discharge planning Services  CM Consult, [facility], Medication Assistance  Post Acute Care Choice:    Choice offered to:     DME Arranged:    DME Agency:     HH Arranged:    HH Agency:     Status of Service:  In process, will continue to follow  If discussed at Long Length of Stay Meetings, dates discussed:    Additional Comments:  Pollie Friar, RN 12/11/2018, 12:34 PM

## 2018-12-12 NOTE — Progress Notes (Signed)
Physical Therapy Wound Treatment Patient Details  Name: Franklin Chavez MRN: [id number] Date of Birth: 04/07/1961  Today's Date: 12/12/2018 Time: 4098-1191 Time Calculation (min): 17 min  Subjective  Subjective: Pt reports he thinks swelling is getting better Patient and Family Stated Goals: get finger better Prior Treatments: I&D in ED  Pain Score: Pain Score: 7   Wound Assessment  Wound / Incision (Open or Dehisced) 12/11/18 Incision - Open Finger (Comment which one) Left lt middle finger (Active)  Dressing Type Gauze (Comment);Compression wrap;Moist to moist 12/12/2018 10:57 AM  Dressing Changed Changed 12/12/2018 10:57 AM  Dressing Status Clean;Dry;Intact 12/12/2018 10:57 AM  Dressing Change Frequency Daily 12/12/2018 10:57 AM  Site / Wound Assessment Red;Pink;Yellow;Painful 12/12/2018 10:57 AM  % Wound base Red or Granulating 70% 12/12/2018 10:57 AM  % Wound base Yellow/Fibrinous Exudate 30% 12/12/2018 10:57 AM  % Wound base Black/Eschar 0% 12/12/2018 10:57 AM  % Wound base Other/Granulation Tissue (Comment) 0% 12/12/2018 10:57 AM  Peri-wound Assessment Edema 12/12/2018 10:57 AM  Wound Length (cm) 1.5 cm 12/11/2018 11:00 AM  Wound Width (cm) 0.5 cm 12/11/2018 11:00 AM  Wound Depth (cm) 0.3 cm 12/11/2018 11:00 AM  Wound Volume (cm^3) 0.22 cm^3 12/11/2018 11:00 AM  Wound Surface Area (cm^2) 0.75 cm^2 12/11/2018 11:00 AM  Margins Unattached edges (unapproximated) 12/12/2018 10:57 AM  Closure None 12/12/2018 10:57 AM  Drainage Amount Minimal 12/12/2018 10:57 AM  Drainage Description Sanguineous 12/12/2018 10:57 AM  Non-staged Wound Description Full thickness 12/12/2018 10:57 AM  Treatment Hydrotherapy (Pulse lavage);Packing (Saline gauze) 12/12/2018 10:57 AM   Hydrotherapy Pulsed lavage therapy - wound location: lt middle finger Pulsed Lavage with Suction (psi): 4 psi Pulsed Lavage with Suction - Normal Saline Used: 500 mL Pulsed Lavage Tip: Tip with splash shield   Wound Assessment and Plan  Wound Therapy -  Assess/Plan/Recommendations Wound Therapy - Clinical Statement: Pt continues to tolerate hydrotherapy well. Continue with hydrotherapy to decr bioburden.  Wound Therapy - Functional Problem List: decr use of lt hand Factors Delaying/Impairing Wound Healing: Infection - systemic/local;Tobacco use Hydrotherapy Plan: Debridement;Dressing change;Patient/family education;Pulsatile lavage with suction Wound Therapy - Frequency: Other (comment)(daily) Wound Therapy - Follow Up Recommendations: Other (comment)(per hand surgeon recommendations) Wound Plan: See above  Wound Therapy Goals- Improve the function of patient's integumentary system by progressing the wound(s) through the phases of wound healing (inflammation - proliferation - remodeling) by: Decrease Necrotic Tissue to: 30 Decrease Necrotic Tissue - Progress: Progressing toward goal Increase Granulation Tissue to: 70 Increase Granulation Tissue - Progress: Progressing toward goal  Goals will be updated until maximal potential achieved or discharge criteria met.  Discharge criteria: when goals achieved, discharge from hospital, MD decision/surgical intervention, no progress towards goals, refusal/missing three consecutive treatments without notification or medical reason.  GP     Shary Decamp Maycok 12/12/2018, 10:59 AM [place] Pager [phone] Office [phone]

## 2018-12-12 NOTE — Progress Notes (Signed)
Patient ID: Franklin Chavez, male   DOB: [birth year], 58 y.o.   MRN: [id number] Stable Await final Cx and sensitivities VSS AF Continue hydrotherapy and teach wet to dry dressing changes Patient understands Greer Ee MD

## 2018-12-13 ENCOUNTER — Encounter (HOSPITAL_COMMUNITY): Payer: Self-pay | Admitting: Internal Medicine

## 2018-12-13 DIAGNOSIS — Z720 Tobacco use: Secondary | ICD-10-CM

## 2018-12-13 DIAGNOSIS — F17200 Nicotine dependence, unspecified, uncomplicated: Secondary | ICD-10-CM

## 2018-12-13 DIAGNOSIS — K089 Disorder of teeth and supporting structures, unspecified: Secondary | ICD-10-CM

## 2018-12-13 HISTORY — DX: Nicotine dependence, unspecified, uncomplicated: F17.200

## 2018-12-13 HISTORY — DX: Disorder of teeth and supporting structures, unspecified: K08.9

## 2018-12-13 MED ORDER — MUPIROCIN 2 % EX OINT
1.0000 "application " | TOPICAL_OINTMENT | Freq: Two times a day (BID) | CUTANEOUS | Status: DC
  Administered 2018-12-13: 1 via NASAL
  Filled 2018-12-13: qty 22

## 2018-12-13 MED ORDER — CHLORHEXIDINE GLUCONATE CLOTH 2 % EX PADS: 6.0000 | MEDICATED_PAD | Freq: Every day | CUTANEOUS | Status: DC

## 2018-12-13 NOTE — Progress Notes (Signed)
Physical Therapy Wound Treatment Patient Details  Name: Franklin Chavez MRN: [id number] Date of Birth: 1961/07/06  Today's Date: 12/13/2018 Time: 0900-0930 Time Calculation (min): 30 min  Subjective  Subjective: Pt states, "I think it's getting better." Patient and Family Stated Goals: get finger better Prior Treatments: I&D in ED  Pain Score: Pain Score: 7  Wound Assessment  Wound / Incision (Open or Dehisced) 12/11/18 Incision - Open Finger (Comment which one) Left lt middle finger (Active)  Wound Image   12/11/2018 11:00 AM  Dressing Type Gauze (Comment);Compression wrap;Moist to moist 12/13/2018 11:39 AM  Dressing Changed New 12/13/2018 11:39 AM  Dressing Status Clean;Dry;Intact 12/13/2018 11:39 AM  Dressing Change Frequency Daily 12/13/2018 11:39 AM  Site / Wound Assessment Red;Pink;Yellow;Painful 12/13/2018 11:39 AM  % Wound base Red or Granulating 70% 12/13/2018 11:39 AM  % Wound base Yellow/Fibrinous Exudate 30% 12/13/2018 11:39 AM  % Wound base Black/Eschar 0% 12/13/2018 11:39 AM  % Wound base Other/Granulation Tissue (Comment) 0% 12/13/2018 11:39 AM  Peri-wound Assessment Edema 12/13/2018 11:39 AM  Wound Length (cm) 1.5 cm 12/13/2018 11:39 AM  Wound Width (cm) 0.5 cm 12/13/2018 11:39 AM  Wound Depth (cm) 0.3 cm 12/13/2018 11:39 AM  Wound Volume (cm^3) 0.22 cm^3 12/13/2018 11:39 AM  Wound Surface Area (cm^2) 0.75 cm^2 12/13/2018 11:39 AM  Margins Unattached edges (unapproximated) 12/13/2018 11:39 AM  Closure None 12/13/2018 11:39 AM  Drainage Amount Minimal 12/13/2018 11:39 AM  Drainage Description Sanguineous 12/13/2018 11:39 AM  Non-staged Wound Description Full thickness 12/13/2018 11:39 AM  Treatment Hydrotherapy (Pulse lavage);Packing (Saline gauze) 12/13/2018 11:39 AM   Hydrotherapy Pulsed lavage therapy - wound location: lt middle finger Pulsed Lavage with Suction (psi): 4 psi Pulsed Lavage with Suction - Normal Saline Used: 500 mL Pulsed Lavage Tip: Tip with splash shield    Wound Assessment and Plan  Wound Therapy - Assess/Plan/Recommendations Wound Therapy - Clinical Statement: Pt continues to tolerate hydrotherapy well. Continue with hydrotherapy to decr bioburden.  Wound Therapy - Functional Problem List: decr use of lt hand Factors Delaying/Impairing Wound Healing: Infection - systemic/local;Tobacco use Hydrotherapy Plan: Debridement;Dressing change;Patient/family education;Pulsatile lavage with suction Wound Therapy - Frequency: Other (comment)(daily) Wound Therapy - Follow Up Recommendations: Other (comment)(per hand surgeon recommendations) Wound Plan: See above  Wound Therapy Goals- Improve the function of patient's integumentary system by progressing the wound(s) through the phases of wound healing (inflammation - proliferation - remodeling) by: Decrease Necrotic Tissue to: 30 Decrease Necrotic Tissue - Progress: Progressing toward goal Increase Granulation Tissue to: 70 Increase Granulation Tissue - Progress: Progressing toward goal Goals/treatment plan/discharge plan were made with and agreed upon by patient/family: Yes Time For Goal Achievement: 7 days Wound Therapy - Potential for Goals: Fair  Goals will be updated until maximal potential achieved or discharge criteria met.  Discharge criteria: when goals achieved, discharge from hospital, MD decision/surgical intervention, no progress towards goals, refusal/missing three consecutive treatments without notification or medical reason.  GP     Ellamae Sia, PT, DPT Acute Rehabilitation Services Pager [phone] Office [phone]   Willy Eddy 12/13/2018, 11:49 AM

## 2018-12-13 NOTE — Consult Note (Signed)
[facility]    Date of Admission:  12/10/2018   Total days of antibiotics 4        Day 4 Vancomycin               Reason for Consult: Left Third Digit MRSA Soft tissue infection with periosteum involvement    Referring Provider: Dominica SeverinWilliam Gramig, MD Primary Care Provider: None Listed  Assessment: Franklin Chavez presented with soft tissue infection of left third digit, surgically explored and drained by orthopedic with cultures growing MRSA. Periosteum of his phalanx was noted to be eroded, this was sent for culture and is growing MRSA as well. Patient has undergone daily irrigation and debridement. Organism is sensitive to doxycycline.  Plan:  1. 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 Tobacco Use - Smoking cessation discussed   Principal Problem:   Foreign body of left middle finger with infection Active Problems:   Tobacco use disorder   Poor dentition  Scheduled Meds: . Chlorhexidine Gluconate Cloth  6 each Topical Q0600  . docusate sodium  100 mg Oral BID  . mupirocin ointment  1 application Nasal BID  . vitamin C  1,000 mg Oral Daily   Continuous Infusions: . lactated ringers 75 mL/hr at 12/13/18 0225  . methocarbamol (ROBAXIN) IV    . vancomycin 1,500 mg (12/13/18 0905)   PRN Meds:.ALPRAZolam, famotidine, methocarbamol **OR** methocarbamol (ROBAXIN) IV, morphine injection, oxyCODONE  HPI: Franklin Chavez is a 58 y.o. male with no significant past medical history who presented on 1/7 with 1 week of worsening pain, swelling, and warmth of his left third finger with a wound distally, possibly due to a splinter. Based on initial evaluation concern was for infection and orthopedics was consulted who explored the wound and encountered purulent material as well as eroded periosteum abutting the infection. Both the wound  material and periosteum were sent for culture and both grew MRSA. Patient has been on Vancomycin since that time.  Review of Systems: Review of Systems  Constitutional: Negative for chills and fever.    History reviewed. No pertinent past medical history.  Social History   Tobacco Use  . Smoking status: Current Every Day Smoker  . Smokeless tobacco: Never Used  Substance Use Topics  . Alcohol use: No  . Drug use: No    History reviewed. No pertinent family history. No Known Allergies  OBJECTIVE: Blood pressure (!) 153/97, pulse (!) 48, temperature 98.8 F (37.1 C), temperature source Oral, resp. rate 17, height 6\' 3"  (1.905 m), weight 79.4 kg, SpO2 95 %.  Physical Exam Constitutional:      General: He is not in acute distress.    Appearance: Normal appearance.  Cardiovascular:     Rate and Rhythm: Normal rate and regular rhythm.     Pulses: Normal pulses.     Heart sounds: Normal heart sounds.  Pulmonary:     Effort: Pulmonary effort is normal. No respiratory distress.     Breath sounds: Normal breath sounds.  Abdominal:     General: Bowel sounds are normal. There is no distension.     Palpations: Abdomen is soft.     Tenderness: There is no abdominal tenderness.  Musculoskeletal:        General: No swelling.     Right lower leg: No edema.  Left lower leg: No edema.     Comments: Clean and dry bandage to left third digit  Skin:    General: Skin is warm and dry.  Neurological:     Mental Status: He is alert.    Lab Results Lab Results  Component Value Date   WBC 11.9 (H) 12/10/2018   HGB 14.9 12/10/2018   HCT 43.9 12/10/2018   MCV 82.7 12/10/2018   PLT 313 12/10/2018    Lab Results  Component Value Date   CREATININE 0.88 12/10/2018   BUN 8 12/10/2018   NA 138 12/10/2018   K 4.3 12/10/2018   CL 106 12/10/2018   CO2 28 12/10/2018   No results found for: ALT, AST, GGT, ALKPHOS, BILITOT   Microbiology: Recent Results (from the past 240 hour(s))    Aerobic/Anaerobic Culture (surgical/deep wound)     Status: None (Preliminary result)   Collection Time: 12/10/18  4:57 PM  Result Value Ref Range Status   Specimen Description WOUND LEFT FINGER  Final   Special Requests SWABS  Final   Gram Stain   Final    RARE WBC PRESENT, PREDOMINANTLY PMN ABUNDANT GRAM POSITIVE COCCI Performed at [facility], [address], [place], [place] [address]    Culture   Final    MODERATE METHICILLIN RESISTANT STAPHYLOCOCCUS AUREUS NO ANAEROBES ISOLATED; CULTURE IN PROGRESS FOR 5 DAYS    Report Status PENDING  Incomplete   Organism ID, Bacteria METHICILLIN RESISTANT STAPHYLOCOCCUS AUREUS  Final      Susceptibility   Methicillin resistant staphylococcus aureus - MIC*    CIPROFLOXACIN <=0.5 SENSITIVE Sensitive     ERYTHROMYCIN >=8 RESISTANT Resistant     GENTAMICIN <=0.5 SENSITIVE Sensitive     OXACILLIN >=4 RESISTANT Resistant     TETRACYCLINE <=1 SENSITIVE Sensitive     VANCOMYCIN 1 SENSITIVE Sensitive     TRIMETH/SULFA <=10 SENSITIVE Sensitive     CLINDAMYCIN <=0.25 SENSITIVE Sensitive     RIFAMPIN <=0.5 SENSITIVE Sensitive     Inducible Clindamycin NEGATIVE Sensitive     * MODERATE METHICILLIN RESISTANT STAPHYLOCOCCUS AUREUS  Aerobic/Anaerobic Culture (surgical/deep wound)     Status: None (Preliminary result)   Collection Time: 12/10/18  4:57 PM  Result Value Ref Range Status   Specimen Description TISSUE LEFT FINGER BONE  Final   Special Requests NONE  Final   Gram Stain   Final    NO WBC SEEN RARE GRAM POSITIVE COCCI Performed at [facility], [address], [place], [place] [address]    Culture   Final    MODERATE METHICILLIN RESISTANT STAPHYLOCOCCUS AUREUS NO ANAEROBES ISOLATED; CULTURE IN PROGRESS FOR 5 DAYS    Report Status PENDING  Incomplete   Organism ID, Bacteria METHICILLIN RESISTANT STAPHYLOCOCCUS AUREUS  Final      Susceptibility   Methicillin resistant staphylococcus aureus - MIC*    CIPROFLOXACIN <=0.5  SENSITIVE Sensitive     ERYTHROMYCIN >=8 RESISTANT Resistant     GENTAMICIN <=0.5 SENSITIVE Sensitive     OXACILLIN >=4 RESISTANT Resistant     TETRACYCLINE <=1 SENSITIVE Sensitive     VANCOMYCIN <=0.5 SENSITIVE Sensitive     TRIMETH/SULFA <=10 SENSITIVE Sensitive     CLINDAMYCIN <=0.25 SENSITIVE Sensitive     RIFAMPIN <=0.5 SENSITIVE Sensitive     Inducible Clindamycin NEGATIVE Sensitive     * MODERATE METHICILLIN RESISTANT STAPHYLOCOCCUS AUREUS    Beola Cord, MD IM PGY-2 [phone] Attending: [phone] pager   [phone] cell 12/13/2018,  4:59 PM

## 2018-12-13 NOTE — Plan of Care (Signed)
Pt is progressing toward desired goal 

## 2018-12-13 NOTE — Progress Notes (Signed)
Patient ID: Franklin Chavez, male   DOB: 12-Nov-1961, 58 y.o.   MRN: [id number]  Patient is seen and evaluated.  Patient has had WaterPik and wet-to-dry dressing changes.  I went ahead and perform my own wet-to-dry dressing change today and discussed with him that tomorrow we can hopefully transition him to home but will need twice daily dressing changes.  Culture show staph aureus in both sets of cultures including fluid and bone.  I discussed this with Dr. Orvan Falconer who was nice enough to see the patient.  Hopefully we can try a p.o. antibiotic regime.  We will get Dr. Blair Dolphin thoughts and move forward.  Likely home tomorrow if things work out.  Certainly the wound is better.  It is still sensitive as expected.  It is going to be some days before the patient feels well and he will need very close observation in the weeks ahead.  The patient is alert and oriented in no acute distress. The patient complains of pain in the affected upper extremity.  The patient is noted to have a normal HEENT exam. Lung fields show equal chest expansion and no shortness of breath. Abdomen exam is nontender without distention. Lower extremity examination does not show any fracture dislocation or blood clot symptoms. Pelvis is stable and the neck and back are stable and nontender.  Evania Lyne MD

## 2018-12-14 MED ORDER — DOXYCYCLINE HYCLATE 50 MG PO CAPS: 50.0000 mg | ORAL_CAPSULE | Freq: Two times a day (BID) | ORAL | 1 refills | Status: DC

## 2018-12-14 MED ORDER — HYDROCODONE-ACETAMINOPHEN 5-325 MG PO TABS: 1.0000 | ORAL_TABLET | Freq: Four times a day (QID) | ORAL | 0 refills | Status: DC | PRN

## 2018-12-14 NOTE — Discharge Summary (Signed)
Physician Discharge Summary  Patient ID: Franklin Chavez MRN: [id number] DOB/AGE: [birth year] 58 y.o.  Admit date: 12/10/2018 Discharge date:   Admission Diagnoses: * No surgery found * History reviewed. No pertinent past medical history.  Discharge Diagnoses:  Principal Problem:   Foreign body of left middle finger with infection Active Problems:   Tobacco use disorder   Poor dentition   Surgeries:  on * No surgery found *    Consultants: Treatment Team:  Dominica Severin, MD  Discharged Condition: Improved  Hospital Course: Franklin Chavez is an 58 y.o. male who was admitted 12/10/2018 with a chief complaint of  Chief Complaint  Patient presents with  . Hand Pain  , and found to have a diagnosis of * No surgery found *.  They were brought to the operating room on * No surgery found * and underwent .    They were given perioperative antibiotics:  Anti-infectives (From admission, onward)   Start     Dose/Rate Route Frequency Ordered Stop   12/14/18 0000  doxycycline (VIBRAMYCIN) 50 MG capsule     50 mg Oral 2 times daily 12/14/18 1321     12/11/18 0800  vancomycin (VANCOCIN) 1,500 mg in sodium chloride 0.9 % 500 mL IVPB     1,500 mg 250 mL/hr over 120 Minutes Intravenous Every 12 hours 12/10/18 1912     12/11/18 0300  piperacillin-tazobactam (ZOSYN) IVPB 3.375 g  Status:  Discontinued     3.375 g 12.5 mL/hr over 240 Minutes Intravenous Every 8 hours 12/10/18 1912 12/11/18 1401   12/10/18 1745  piperacillin-tazobactam (ZOSYN) IVPB 3.375 g     3.375 g 100 mL/hr over 30 Minutes Intravenous  Once 12/10/18 1738 12/10/18 2004   12/10/18 1745  vancomycin (VANCOCIN) 1,500 mg in sodium chloride 0.9 % 500 mL IVPB     1,500 mg 250 mL/hr over 120 Minutes Intravenous  Once 12/10/18 1738 12/10/18 2229    .  They were given sequential compression devices, early ambulation, and Other (comment) for DVT prophylaxis.  Recent vital signs:  Patient Vitals for the past 24 hrs:  BP Temp  Temp src Pulse Resp SpO2  12/14/18 1221 (!) 155/85 - - (!) 50 18 99 %  12/14/18 0735 (!) 144/85 98.1 F (36.7 C) Oral (!) 46 20 93 %  12/14/18 0454 (!) 146/95 98.1 F (36.7 C) Oral (!) 43 18 97 %  12/14/18 0005 (!) 154/103 98.1 F (36.7 C) Oral (!) 119 18 95 %  12/13/18 2037 (!) 168/89 98.6 F (37 C) Oral (!) 48 18 96 %  .  Recent laboratory studies: No results found.  Discharge Medications:   Allergies as of 12/14/2018   No Known Allergies     Medication List    STOP taking these medications   cyclobenzaprine 10 MG tablet Commonly known as:  FLEXERIL   ibuprofen 400 MG tablet Commonly known as:  ADVIL,MOTRIN     TAKE these medications   doxycycline 50 MG capsule Commonly known as:  VIBRAMYCIN Take 1 capsule (50 mg total) by mouth 2 (two) times daily.   HYDROcodone-acetaminophen 5-325 MG tablet Commonly known as:  NORCO Take 1 tablet by mouth every 6 (six) hours as needed for moderate pain.       Diagnostic Studies: Dg Finger Middle Left  Result Date: 12/10/2018 CLINICAL DATA:  Left third digit pain and swelling, possible foreign body EXAM: LEFT MIDDLE FINGER 2+V COMPARISON:  None. FINDINGS: Generalized soft tissue swelling in the third  digit is noted. No bony erosive changes are seen. No acute fracture or dislocation is noted. By history there is suggestion of a foreign body although no radiopaque density is seen. IMPRESSION: Soft tissue swelling without acute bony abnormality or definitive radiopaque foreign body. Electronically Signed   By: Alcide CleverMark  Lukens M.D.   On: 12/10/2018 16:14    They benefited maximally from their hospital stay and there were no complications.     Disposition: Discharge disposition: 01-Home or Self Care      Discharge Instructions    Call MD / Call 911   Complete by:  As directed    If you experience chest pain or shortness of breath, CALL 911 and be transported to the hospital emergency room.  If you develope a fever above 101 F, pus  (white drainage) or increased drainage or redness at the wound, or calf pain, call your surgeon's office.   Constipation Prevention   Complete by:  As directed    Drink plenty of fluids.  Prune juice may be helpful.  You may use a stool softener, such as Colace (over the counter) 100 mg twice a day.  Use MiraLax (over the counter) for constipation as needed.   Diet - low sodium heart healthy   Complete by:  As directed    Increase activity slowly as tolerated   Complete by:  As directed      Follow-up Information    [facility] Follow up.   Why:  Please use this location for your pharmacy needs. Contact information: [address] [place] [phone]       Dominica SeverinGramig, Kristyne Woodring, MD Follow up in 14 day(s).   Specialty:  Orthopedic Surgery Why:  Please call to see Dr. Amanda PeaGramig in 14 days Contact information: [address] [place] [id number] [phone]          Status post irrigation and debridement of the left middle finger secondary to infection.  This patient will be treated for osteomyelitis presumptively.  At the time of discharge no signs of infection dystrophy or vascular compromise.  He will begin home dressing changes twice a day and be on doxycycline for 8 weeks.  I will see him in my office in 4 weeks.  Patient has a much improved finger at the conclusion of his hospital stay.  We will need to watch him closely as we are treating this as a deep bone infection.  All wound parameters look much better  Signed: Oletta CohnWilliam M Safia Panzer III   Signed: Dionne AnoWilliam M Yuridiana Formanek III 12/14/2018, 1:24 PM

## 2018-12-14 NOTE — Care Management (Signed)
MATCH letter called in to [facility] where Rxs where e scribed to. Attempt to reach patient, no answer, no VM.

## 2018-12-14 NOTE — Progress Notes (Signed)
Nurse tech will enter room and find p.t. sleeping. When NT wakes p.t. to assess vitals. P.t. will state he needs pain medicine. P.t. unable to provide detailed description of pain.

## 2018-12-14 NOTE — Discharge Instructions (Signed)
Please change her bandage to twice a day with wet-to-dry dressing changes and take the antibiotics for 8 weeks.  Keep bandage clean and dry.  Call for any problems.  No smoking.  Criteria for driving a car: you should be off your pain medicine for 7-8 hours, able to drive one handed(confident), thinking clearly and feeling able in your judgement to drive. Continue elevation as it will decrease swelling.  If instructed by MD move your fingers within the confines of the bandage/splint.  Use ice if instructed by your MD. Call immediately for any sudden loss of feeling in your hand/arm or change in functional abilities of the extremity.We recommend that you to take vitamin C 1000 mg a day to promote healing. We also recommend that if you require  pain medicine that you take a stool softener to prevent constipation as most pain medicines will have constipation side effects. We recommend either Peri-Colace or Senokot and recommend that you also consider adding MiraLAX as well to prevent the constipation affects from pain medicine if you are required to use them. These medicines are over the counter and may be purchased at a local pharmacy. A cup of yogurt and a probiotic can also be helpful during the recovery process as the medicines can disrupt your intestinal environment.

## 2018-12-14 NOTE — Progress Notes (Signed)
Physical Therapy Wound Treatment Patient Details  Name: Franklin Chavez MRN: [id number] Date of Birth: 12/09/1960  Today's Date: 12/14/2018 Time: 0910-0917 Time Calculation (min): 7 min  Subjective  Subjective: Pt hopeful to return home Patient and Family Stated Goals: get finger better Prior Treatments: I&D in ED  Pain Score:  Pt premedicated. Significant pain.  Wound Assessment  Wound / Incision (Open or Dehisced) 12/11/18 Incision - Open Finger (Comment which one) Left lt middle finger (Active)  Dressing Type Gauze (Comment);Moist to moist 12/14/2018  8:42 AM  Dressing Changed Changed 12/14/2018  8:42 AM  Dressing Status Clean;Dry;Intact 12/14/2018  8:42 AM  Dressing Change Frequency Daily 12/14/2018  8:42 AM  Site / Wound Assessment Red;Pink;Yellow;Painful 12/14/2018  8:42 AM  % Wound base Red or Granulating 60% 12/14/2018  8:42 AM  % Wound base Yellow/Fibrinous Exudate 40% 12/14/2018  8:42 AM  % Wound base Black/Eschar 0% 12/14/2018  8:42 AM  % Wound base Other/Granulation Tissue (Comment) 0% 12/14/2018  8:42 AM  Peri-wound Assessment Edema 12/14/2018  8:42 AM  Wound Length (cm) 1.5 cm 12/13/2018 11:39 AM  Wound Width (cm) 0.5 cm 12/13/2018 11:39 AM  Wound Depth (cm) 0.3 cm 12/13/2018 11:39 AM  Wound Volume (cm^3) 0.22 cm^3 12/13/2018 11:39 AM  Wound Surface Area (cm^2) 0.75 cm^2 12/13/2018 11:39 AM  Margins Unattached edges (unapproximated) 12/14/2018  8:42 AM  Closure None 12/14/2018  8:42 AM  Drainage Amount Minimal 12/14/2018  8:42 AM  Drainage Description Serosanguineous 12/14/2018  8:42 AM  Non-staged Wound Description Full thickness 12/14/2018  8:42 AM  Treatment Hydrotherapy (Pulse lavage);Packing (Saline gauze) 12/14/2018  8:42 AM   Hydrotherapy Pulsed lavage therapy - wound location: lt middle finger Pulsed Lavage with Suction (psi): 4 psi(4-8) Pulsed Lavage with Suction - Normal Saline Used: 500 mL Pulsed Lavage Tip: Tip with splash shield   Wound Assessment and Plan  Wound  Therapy - Assess/Plan/Recommendations Wound Therapy - Clinical Statement: Pt continues to tolerate hydrotherapy well. Continue with hydrotherapy to decr bioburden and promote removal of nonviable tissue Wound Therapy - Functional Problem List: decr use of lt hand Factors Delaying/Impairing Wound Healing: Infection - systemic/local;Tobacco use Hydrotherapy Plan: Debridement;Dressing change;Patient/family education;Pulsatile lavage with suction Wound Therapy - Frequency: Other (comment)(daily) Wound Therapy - Follow Up Recommendations: Other (comment)(per hand surgeon recommendations) Wound Plan: See above  Wound Therapy Goals- Improve the function of patient's integumentary system by progressing the wound(s) through the phases of wound healing (inflammation - proliferation - remodeling) by: Decrease Necrotic Tissue to: 30 Decrease Necrotic Tissue - Progress: Progressing toward goal Increase Granulation Tissue to: 70 Increase Granulation Tissue - Progress: Progressing toward goal  Goals will be updated until maximal potential achieved or discharge criteria met.  Discharge criteria: when goals achieved, discharge from hospital, MD decision/surgical intervention, no progress towards goals, refusal/missing three consecutive treatments without notification or medical reason.  GP     Shary Decamp Maycok 12/14/2018, 8:45 AM [place] Pager [phone] Office [phone]

## 2018-12-14 NOTE — Progress Notes (Signed)
P.t. discharged from unit via wheelchair. P.t. received d/c handout and teaching. P.t verbalized understanding. P.t left with supplies (Saline, gauze, tape) to continue dressing changes at home.

## 2018-12-15 LAB — AEROBIC/ANAEROBIC CULTURE W GRAM STAIN (SURGICAL/DEEP WOUND): Gram Stain: NONE SEEN

## 2018-12-15 LAB — AEROBIC/ANAEROBIC CULTURE (SURGICAL/DEEP WOUND)

## 2018-12-19 ENCOUNTER — Ambulatory Visit: Payer: Self-pay | Admitting: Family Medicine

## 2018-12-31 ENCOUNTER — Inpatient Hospital Stay: Payer: Self-pay | Admitting: Internal Medicine

## 2020-01-18 ENCOUNTER — Emergency Department (HOSPITAL_COMMUNITY)
Admission: EM | Admit: 2020-01-18 | Discharge: 2020-01-18 | Disposition: A | Discharge status: Home / Self Care | Payer: Self-pay | Attending: Emergency Medicine | Admitting: Emergency Medicine

## 2020-01-18 ENCOUNTER — Other Ambulatory Visit: Payer: Self-pay

## 2020-01-18 DIAGNOSIS — R040 Epistaxis: Secondary | ICD-10-CM | POA: Insufficient documentation

## 2020-01-18 DIAGNOSIS — I10 Essential (primary) hypertension: Secondary | ICD-10-CM | POA: Insufficient documentation

## 2020-01-18 DIAGNOSIS — F17200 Nicotine dependence, unspecified, uncomplicated: Secondary | ICD-10-CM | POA: Diagnosis not present

## 2020-01-18 NOTE — Discharge Instructions (Signed)
Your nosebleed has resolved.  If it bleeds again, hold steady pressure for 10 minutes.  Your blood pressure is high today, have it recheck by your doctor in the next 2-4 days.

## 2020-01-18 NOTE — ED Provider Notes (Signed)
[facility] DEPT Provider Note   CSN: [id number] Arrival date & time: 01/18/20  1445     History No chief complaint on file.   Franklin Chavez is a 59 y.o. male.  The history is provided by the patient. No language interpreter was used.     59 year old male presenting complaining of nosebleed.  Patient is an inmate at a local jail.  He report throughout the day today he has been sneezing quite a bit and then he noticed a nosebleed.  He was unsure which side of the nostril that the bleeding was from but it lasted for proximately 3 to 4 minutes.  No associated pain no recent injury he did endorse a mild lightheadedness initially.  He was reportedly noted to have high blood pressure and was brought here for further evaluation.  He does not have any history of high blood pressure he is not on any blood thinner medication he has no other complaint.  He denies any blood drainage to the back of his throat.  No past medical history on file.  Patient Active Problem List   Diagnosis Date Noted  . Tobacco use disorder 12/13/2018  . Poor dentition 12/13/2018  . Foreign body of left middle finger with infection 12/10/2018    No past surgical history on file.     No family history on file.  Social History   Tobacco Use  . Smoking status: Current Every Day Smoker  . Smokeless tobacco: Never Used  Substance Use Topics  . Alcohol use: No  . Drug use: No    Home Medications Prior to Admission medications   Medication Sig Start Date End Date Taking? Authorizing Provider  doxycycline (VIBRAMYCIN) 50 MG capsule Take 1 capsule (50 mg total) by mouth 2 (two) times daily. 12/14/18   Roseanne Kaufman, MD  HYDROcodone-acetaminophen (NORCO) 5-325 MG tablet Take 1 tablet by mouth every 6 (six) hours as needed for moderate pain. 12/14/18   Roseanne Kaufman, MD    Allergies    Patient has no known allergies.  Review of Systems   Review of Systems  Constitutional:  Negative for fever.  HENT: Positive for nosebleeds.   Skin: Negative for wound.  Neurological: Positive for light-headedness. Negative for headaches.    Physical Exam Updated Vital Signs There were no vitals taken for this visit.  Physical Exam Vitals and nursing note reviewed.  Constitutional:      General: He is not in acute distress.    Appearance: He is well-developed.  HENT:     Head: Atraumatic.     Nose:     Comments: Both nares are clear no obvious source of bleeding.  Patient does have some blood noted on a rag that he brought with him.  Nose nontender. Eyes:     Conjunctiva/sclera: Conjunctivae normal.  Cardiovascular:     Rate and Rhythm: Normal rate and regular rhythm.     Pulses: Normal pulses.     Heart sounds: Normal heart sounds.  Pulmonary:     Effort: Pulmonary effort is normal.     Breath sounds: Normal breath sounds. No wheezing, rhonchi or rales.  Abdominal:     Palpations: Abdomen is soft.     Tenderness: There is no abdominal tenderness.  Musculoskeletal:     Cervical back: Neck supple.  Skin:    Findings: No rash.  Neurological:     Mental Status: He is alert.  Psychiatric:        Mood  and Affect: Mood normal.     ED Results / Procedures / Treatments   Labs (all labs ordered are listed, but only abnormal results are displayed) Labs Reviewed - No data to display  EKG None  Radiology No results found.  Procedures Procedures (including critical care time)  Medications Ordered in ED Medications - No data to display  ED Course  I have reviewed the triage vital signs and the nursing notes.  Pertinent labs & imaging results that were available during my care of the patient were reviewed by me and considered in my medical decision making (see chart for details).    MDM Rules/Calculators/A&P                      BP (!) 178/102 (BP Location: Right Arm)   Pulse (!) 57   Temp 98 F (36.7 C) (Oral)   Resp 18   Ht 6' (1.829 m)   Wt  74.8 kg   SpO2 98%   BMI 22.38 kg/m   Final Clinical Impression(s) / ED Diagnoses Final diagnoses:  Epistaxis  Hypertension, unspecified type    Rx / DC Orders ED Discharge Orders    None     3:12 PM Patient here with transient nosebleed lasting less than 5 minutes.  No active nosebleed at this time.  3:44 PM Initial BP of 178/102.  Doubt hypertensive emergency.  Pt have been monitored for 1 hr without nosebleed.  Reassurance given.  Recommend having BP recheck in the next few days.  Do not think BP medication is indicated at this time.    Fayrene Helper, PA-C 01/18/20 1554    Linwood Dibbles, MD 01/20/20 1047

## 2020-02-17 IMAGING — DX DG FINGER MIDDLE 2+V*L*
3 series · 3 of 3 positions shown · non-contrast
Comparison: None.

CLINICAL DATA: Left third digit pain and swelling, possible foreign
body

EXAM:
LEFT MIDDLE FINGER 2+V

[x finger pa left]
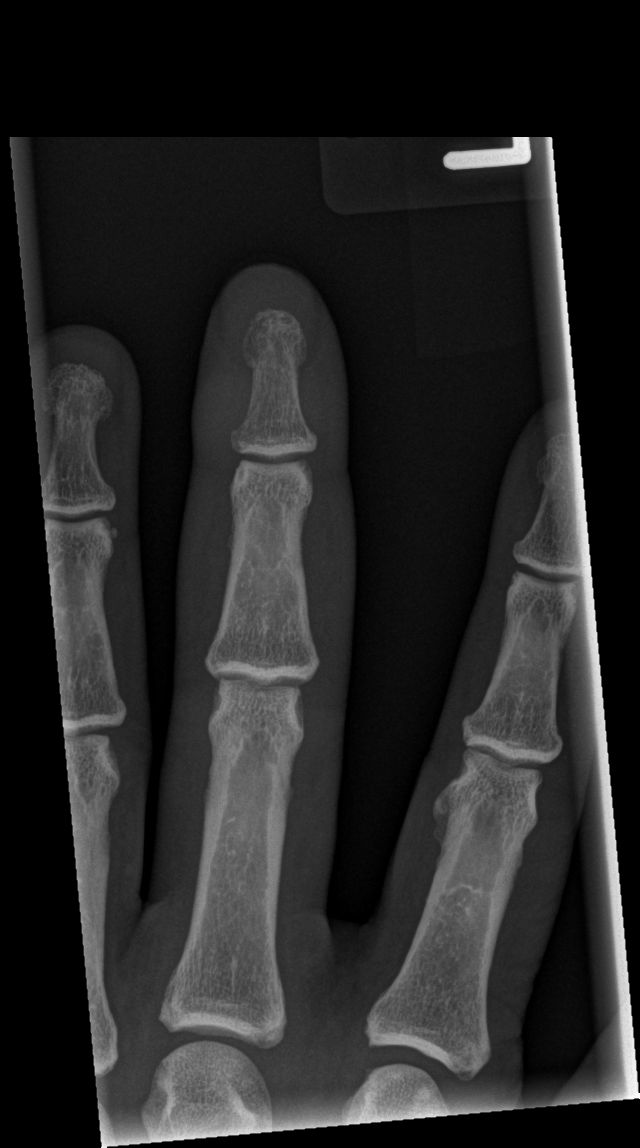

[x finger obl left]
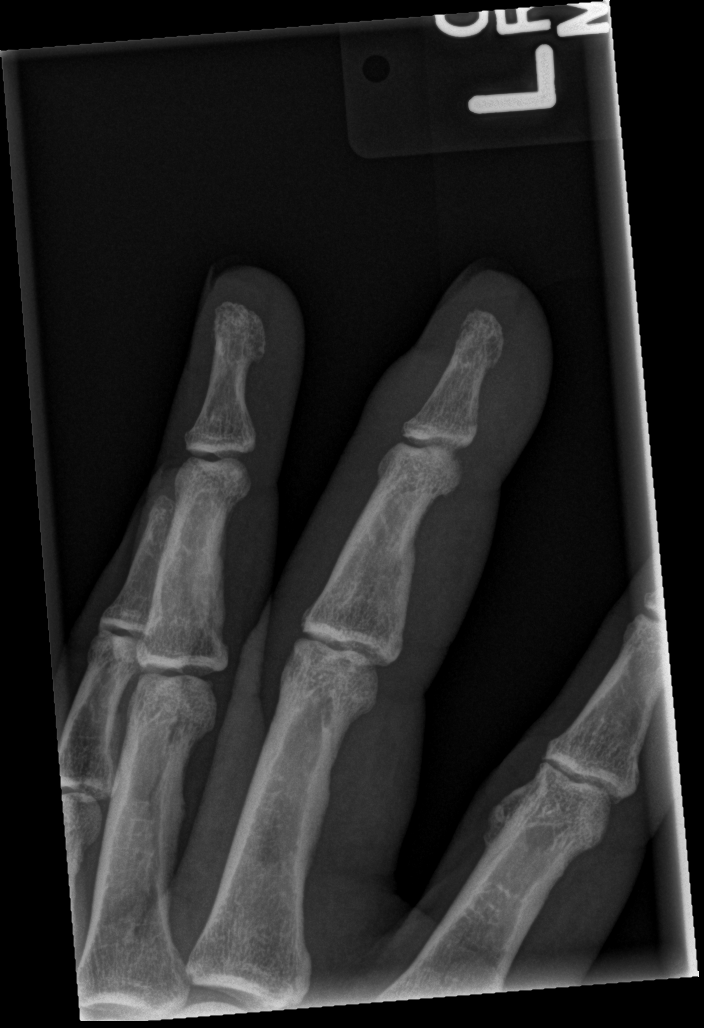

[x finger lat left]
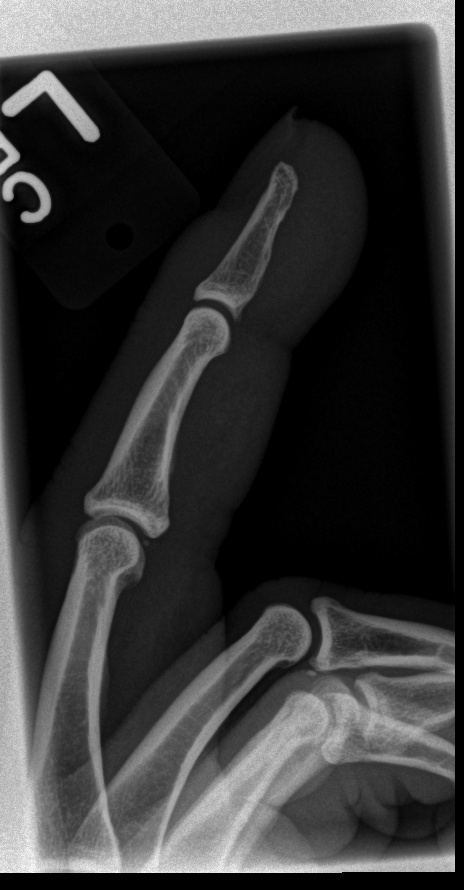

[3 of 3 positions shown; findings below may reference images not displayed]

FINDINGS: Generalized soft tissue swelling in the third digit is noted. No
bony erosive changes are seen. No acute fracture or dislocation is
noted. By history there is suggestion of a foreign body although no
radiopaque density is seen.
IMPRESSION: Soft tissue swelling without acute bony abnormality or definitive
radiopaque foreign body.

## 2022-10-05 ENCOUNTER — Emergency Department (HOSPITAL_COMMUNITY): Payer: Commercial Managed Care - HMO

## 2022-10-05 ENCOUNTER — Other Ambulatory Visit: Payer: Self-pay

## 2022-10-05 ENCOUNTER — Emergency Department (HOSPITAL_COMMUNITY)
Admission: EM | Admit: 2022-10-05 | Discharge: 2022-10-06 | Disposition: A | Discharge status: Home / Self Care | Payer: Commercial Managed Care - HMO | Attending: Emergency Medicine | Admitting: Emergency Medicine

## 2022-10-05 ENCOUNTER — Encounter (HOSPITAL_COMMUNITY): Payer: Self-pay | Admitting: Emergency Medicine

## 2022-10-05 DIAGNOSIS — I10 Essential (primary) hypertension: Secondary | ICD-10-CM | POA: Diagnosis not present

## 2022-10-05 DIAGNOSIS — F17200 Nicotine dependence, unspecified, uncomplicated: Secondary | ICD-10-CM | POA: Diagnosis not present

## 2022-10-05 DIAGNOSIS — R079 Chest pain, unspecified: Secondary | ICD-10-CM | POA: Diagnosis present

## 2022-10-05 NOTE — ED Triage Notes (Signed)
Patient arrived with EMS from home reports central chest pain with SOB this week , denies emesis or diaphoresis , he received ASA 324 mg and 1 NTG sl prior to arrival .

## 2022-10-06 LAB — CBC
HCT: 41.8 % (ref 39.0–52.0)
Hemoglobin: 14 g/dL (ref 13.0–17.0)
MCH: 28.6 pg (ref 26.0–34.0)
MCHC: 33.5 g/dL (ref 30.0–36.0)
MCV: 85.5 fL (ref 80.0–100.0)
Platelets: 276 10*3/uL (ref 150–400)
RBC: 4.89 MIL/uL (ref 4.22–5.81)
RDW: 14.3 % (ref 11.5–15.5)
WBC: 11.6 10*3/uL — ABNORMAL HIGH (ref 4.0–10.5)
nRBC: 0 % (ref 0.0–0.2)

## 2022-10-06 LAB — BASIC METABOLIC PANEL
Anion gap: 10 (ref 5–15)
BUN: 11 mg/dL (ref 6–20)
CO2: 26 mmol/L (ref 22–32)
Calcium: 8.2 mg/dL — ABNORMAL LOW (ref 8.9–10.3)
Chloride: 103 mmol/L (ref 98–111)
Creatinine, Ser: 0.98 mg/dL (ref 0.61–1.24)
GFR, Estimated: >60 mL/min (ref >60)
Glucose, Bld: 103 mg/dL — ABNORMAL HIGH (ref 70–99)
Potassium: 3.9 mmol/L (ref 3.5–5.1)
Sodium: 139 mmol/L (ref 135–145)

## 2022-10-06 LAB — TROPONIN I (HIGH SENSITIVITY)
Troponin I (High Sensitivity): 10 ng/L (ref <18)
Troponin I (High Sensitivity): 10 ng/L (ref <18)

## 2022-10-06 NOTE — ED Provider Notes (Signed)
[facility] EMERGENCY DEPARTMENT Provider Note   CSN: [id number] Arrival date & time: 10/05/22  2230     History  Chief Complaint  Patient presents with   Chest Pain    Franklin Chavez is a 61 y.o. male.  61 yo M with a chief complaint of chest pain.  This been going on for couple days.  Noted to the center of his chest.  Seems to be worse with smoking cigarettes.  Nothing seems to make it better.  Denies radiation of the pain.  Has had a mild cough that is chronic and unchanged.  Denies trauma to the chest.  Denies worsening with movement palpation and twisting.  -He also is describing some cramping to his legs.  This has been ongoing for weeks.  Has not seen a doctor in some time.  Patient denies history of MI, denies hyperlipidemia diabetes.  Denies family history of MI. Hx of HTN, every day smoker.   Patient denies history of PE or DVT denies hemoptysis denies unilateral lower extremity edema denies recent surgery immobilization hospitalization estrogen use or history of cancer.     Chest Pain      Home Medications Prior to Admission medications   Medication Sig Start Date End Date Taking? Authorizing Provider  doxycycline (VIBRAMYCIN) 50 MG capsule Take 1 capsule (50 mg total) by mouth 2 (two) times daily. 12/14/18   Roseanne Kaufman, MD  HYDROcodone-acetaminophen (NORCO) 5-325 MG tablet Take 1 tablet by mouth every 6 (six) hours as needed for moderate pain. 12/14/18   Roseanne Kaufman, MD      Allergies    Patient has no known allergies.    Review of Systems   Review of Systems  Cardiovascular:  Positive for chest pain.    Physical Exam Updated Vital Signs BP (!) 174/99 (BP Location: Right Arm)   Pulse 72   Temp 98.2 F (36.8 C)   Resp 18   SpO2 (!) 89%  Physical Exam Vitals and nursing note reviewed.  Constitutional:      Appearance: He is well-developed.  HENT:     Head: Normocephalic and atraumatic.  Eyes:     Pupils: Pupils are  equal, round, and reactive to light.  Neck:     Vascular: No JVD.  Cardiovascular:     Rate and Rhythm: Normal rate and regular rhythm.     Heart sounds: No murmur heard.    No friction rub. No gallop.  Pulmonary:     Effort: No respiratory distress.     Breath sounds: No wheezing.  Abdominal:     General: There is no distension.     Tenderness: There is no abdominal tenderness. There is no guarding or rebound.  Musculoskeletal:        General: Normal range of motion.     Cervical back: Normal range of motion and neck supple.  Skin:    Coloration: Skin is not pale.     Findings: No rash.  Neurological:     Mental Status: He is alert and oriented to person, place, and time.  Psychiatric:        Behavior: Behavior normal.     ED Results / Procedures / Treatments   Labs (all labs ordered are listed, but only abnormal results are displayed) Labs Reviewed  BASIC METABOLIC PANEL - Abnormal; Notable for the following components:      Result Value   Glucose, Bld 103 (*)    Calcium 8.2 (*)  All other components within normal limits  CBC - Abnormal; Notable for the following components:   WBC 11.6 (*)    All other components within normal limits  TROPONIN I (HIGH SENSITIVITY)  TROPONIN I (HIGH SENSITIVITY)    EKG EKG Interpretation  Date/Time:  Thursday October 05 2022 22:37:10 EDT Ventricular Rate:  73 PR Interval:  142 QRS Duration: 86 QT Interval:  426 QTC Calculation: 469 R Axis:   79 Text Interpretation: Normal sinus rhythm Minimal voltage criteria for LVH, may be normal variant ( Sokolow-Lyon ) Borderline ECG No significant change since last tracing Confirmed by Melene Plan [phone]) on 10/06/2022 3:33:54 AM  Radiology DG Chest 2 View  Result Date: 10/05/2022 CLINICAL DATA:  Coughing and chest pain. EXAM: CHEST - 2 VIEW COMPARISON:  PA Lat 12/13/2016 FINDINGS: The heart size and mediastinal contours are within normal limits. Both lungs are mildly emphysematous but  clear. The visualized skeletal structures are intact, with slight thoracic dextroscoliosis. IMPRESSION: No evidence of acute chest disease.  Emphysema. Electronically Signed   By: Almira Bar M.D.   On: 10/05/2022 23:21    Procedures Procedures  Discussed smoking cessation with patient and was they were offerred resources to help stop.  Total time was 5 min CPT code 40981.     Medications Ordered in ED Medications - No data to display  ED Course/ Medical Decision Making/ A&P                           Medical Decision Making Amount and/or Complexity of Data Reviewed Labs: ordered. Radiology: ordered.   61 yo M with a chief complaints of chest pain.  This has been going on for a few days now.  Atypical in nature.  Not exertional.  Has been able to ride a bike without significant symptoms.  He also has been complaining of some cramping to his legs.  He is a every day smoker.  His EKG is nonischemic is independently interpreted by me.  Chest x-ray independently interpreted by me without pneumothorax or focal infiltrate.  He has 2 troponins that are negative.  No anemia no significant electrolyte abnormality.  I discussed the results with the patient.  Feels symptoms are completely atypical of pulmonary embolism.  We will do a trial of H2 blockers.  PCP follow-up.  4:11 AM:  I have discussed the diagnosis/risks/treatment options with the patient.  Evaluation and diagnostic testing in the emergency department does not suggest an emergent condition requiring admission or immediate intervention beyond what has been performed at this time.  They will follow up with PCP. We also discussed returning to the ED immediately if new or worsening sx occur. We discussed the sx which are most concerning (e.g., sudden worsening pain, fever, inability to tolerate by mouth) that necessitate immediate return. Medications administered to the patient during their visit and any new prescriptions provided to the  patient are listed below.  Medications given during this visit Medications - No data to display   The patient appears reasonably screen and/or stabilized for discharge and I doubt any other medical condition or other [facility] requiring further screening, evaluation, or treatment in the ED at this time prior to discharge.          Final Clinical Impression(s) / ED Diagnoses Final diagnoses:  Nonspecific chest pain    Rx / DC Orders ED Discharge Orders     None  Melene Plan, DO 10/06/22 [phone]

## 2022-10-06 NOTE — Discharge Instructions (Signed)
Try pepcid or tagamet up to twice a day.  Try to avoid things that may make this worse, most commonly these are spicy foods tomato based products fatty foods chocolate and peppermint.  Alcohol and tobacco can also make this worse.  Return to the emergency department for sudden worsening pain fever or inability to eat or drink.  You can try and take magnesium oxide supplements for your cramping.  Please try and follow-up with the family doctor in the office.  Smoking is bad for you you should try to quit if you are able.  There is information in this paperwork to call for help quitting if you would like.

## 2022-10-23 ENCOUNTER — Encounter (HOSPITAL_COMMUNITY): Payer: Self-pay

## 2022-10-23 ENCOUNTER — Inpatient Hospital Stay (HOSPITAL_COMMUNITY)
Admission: EM | Admit: 2022-10-23 | Discharge: 2022-10-26 | DRG: 190 | Disposition: A | Discharge status: Home / Self Care | Payer: Commercial Managed Care - HMO | Attending: Internal Medicine | Admitting: Internal Medicine

## 2022-10-23 ENCOUNTER — Emergency Department (HOSPITAL_COMMUNITY): Payer: Commercial Managed Care - HMO

## 2022-10-23 DIAGNOSIS — Z1152 Encounter for screening for COVID-19: Secondary | ICD-10-CM

## 2022-10-23 DIAGNOSIS — I10 Essential (primary) hypertension: Secondary | ICD-10-CM | POA: Diagnosis present

## 2022-10-23 DIAGNOSIS — J441 Chronic obstructive pulmonary disease with (acute) exacerbation: Principal | ICD-10-CM | POA: Diagnosis present

## 2022-10-23 DIAGNOSIS — E876 Hypokalemia: Secondary | ICD-10-CM | POA: Diagnosis present

## 2022-10-23 DIAGNOSIS — Z716 Tobacco abuse counseling: Secondary | ICD-10-CM

## 2022-10-23 DIAGNOSIS — T380X5A Adverse effect of glucocorticoids and synthetic analogues, initial encounter: Secondary | ICD-10-CM | POA: Diagnosis present

## 2022-10-23 DIAGNOSIS — M1909 Primary osteoarthritis, other specified site: Secondary | ICD-10-CM | POA: Diagnosis present

## 2022-10-23 DIAGNOSIS — Z8249 Family history of ischemic heart disease and other diseases of the circulatory system: Secondary | ICD-10-CM

## 2022-10-23 DIAGNOSIS — X58XXXA Exposure to other specified factors, initial encounter: Secondary | ICD-10-CM | POA: Diagnosis present

## 2022-10-23 DIAGNOSIS — R451 Restlessness and agitation: Secondary | ICD-10-CM | POA: Diagnosis not present

## 2022-10-23 DIAGNOSIS — R739 Hyperglycemia, unspecified: Secondary | ICD-10-CM | POA: Diagnosis present

## 2022-10-23 DIAGNOSIS — Z833 Family history of diabetes mellitus: Secondary | ICD-10-CM

## 2022-10-23 DIAGNOSIS — F17200 Nicotine dependence, unspecified, uncomplicated: Secondary | ICD-10-CM | POA: Diagnosis present

## 2022-10-23 DIAGNOSIS — J9601 Acute respiratory failure with hypoxia: Secondary | ICD-10-CM | POA: Diagnosis present

## 2022-10-23 DIAGNOSIS — M1990 Unspecified osteoarthritis, unspecified site: Secondary | ICD-10-CM | POA: Diagnosis present

## 2022-10-23 DIAGNOSIS — Z79899 Other long term (current) drug therapy: Secondary | ICD-10-CM

## 2022-10-23 DIAGNOSIS — R61 Generalized hyperhidrosis: Secondary | ICD-10-CM | POA: Diagnosis present

## 2022-10-23 DIAGNOSIS — R9431 Abnormal electrocardiogram [ECG] [EKG]: Secondary | ICD-10-CM | POA: Diagnosis present

## 2022-10-23 LAB — CBC WITH DIFFERENTIAL/PLATELET
Abs Immature Granulocytes: 0.03 10*3/uL (ref 0.00–0.07)
Basophils Absolute: 0 10*3/uL (ref 0.0–0.1)
Basophils Relative: 0 %
Eosinophils Absolute: 0.8 10*3/uL — ABNORMAL HIGH (ref 0.0–0.5)
Eosinophils Relative: 7 %
HCT: 43.4 % (ref 39.0–52.0)
Hemoglobin: 14.8 g/dL (ref 13.0–17.0)
Immature Granulocytes: 0 %
Lymphocytes Relative: 12 %
Lymphs Abs: 1.4 10*3/uL (ref 0.7–4.0)
MCH: 29 pg (ref 26.0–34.0)
MCHC: 34.1 g/dL (ref 30.0–36.0)
MCV: 84.9 fL (ref 80.0–100.0)
Monocytes Absolute: 0.7 10*3/uL (ref 0.1–1.0)
Monocytes Relative: 6 %
Neutro Abs: 9.1 10*3/uL — ABNORMAL HIGH (ref 1.7–7.7)
Neutrophils Relative %: 75 %
Platelets: 337 10*3/uL (ref 150–400)
RBC: 5.11 MIL/uL (ref 4.22–5.81)
RDW: 13.8 % (ref 11.5–15.5)
WBC: 12 10*3/uL — ABNORMAL HIGH (ref 4.0–10.5)
nRBC: 0 % (ref 0.0–0.2)

## 2022-10-23 LAB — HEPATIC FUNCTION PANEL
ALT: 29 U/L (ref 0–44)
AST: 37 U/L (ref 15–41)
Albumin: 3.6 g/dL (ref 3.5–5.0)
Alkaline Phosphatase: 42 U/L (ref 38–126)
Bilirubin, Direct: <0.1 mg/dL (ref 0.0–0.2)
Total Bilirubin: 0.4 mg/dL (ref 0.3–1.2)
Total Protein: 7.1 g/dL (ref 6.5–8.1)

## 2022-10-23 LAB — BASIC METABOLIC PANEL
Anion gap: 8 (ref 5–15)
BUN: 14 mg/dL (ref 6–20)
CO2: 27 mmol/L (ref 22–32)
Calcium: 8.4 mg/dL — ABNORMAL LOW (ref 8.9–10.3)
Chloride: 105 mmol/L (ref 98–111)
Creatinine, Ser: 0.98 mg/dL (ref 0.61–1.24)
GFR, Estimated: >60 mL/min (ref >60)
Glucose, Bld: 166 mg/dL — ABNORMAL HIGH (ref 70–99)
Potassium: 3.5 mmol/L (ref 3.5–5.1)
Sodium: 140 mmol/L (ref 135–145)

## 2022-10-23 LAB — BLOOD GAS, VENOUS
Acid-Base Excess: 2.8 mmol/L — ABNORMAL HIGH (ref 0.0–2.0)
Bicarbonate: 30.1 mmol/L — ABNORMAL HIGH (ref 20.0–28.0)
O2 Saturation: 90.3 %
Patient temperature: 36.1
pCO2, Ven: 55 mmHg (ref 44–60)
pH, Ven: 7.34 (ref 7.25–7.43)
pO2, Ven: 56 mmHg — ABNORMAL HIGH (ref 32–45)

## 2022-10-23 LAB — D-DIMER, QUANTITATIVE: D-Dimer, Quant: 0.36 ug/mL-FEU (ref 0.00–0.50)

## 2022-10-23 LAB — RESP PANEL BY RT-PCR (FLU A&B, COVID) ARPGX2
Influenza A by PCR: NEGATIVE
Influenza B by PCR: NEGATIVE
SARS Coronavirus 2 by RT PCR: NEGATIVE

## 2022-10-23 LAB — LIPASE, BLOOD: Lipase: 41 U/L (ref 11–51)

## 2022-10-23 LAB — TROPONIN I (HIGH SENSITIVITY): Troponin I (High Sensitivity): 12 ng/L (ref <18)

## 2022-10-23 MED ORDER — NICOTINE 21 MG/24HR TD PT24: 21.0000 mg | MEDICATED_PATCH | Freq: Every day | TRANSDERMAL | Status: DC | PRN

## 2022-10-23 MED ORDER — LORAZEPAM 2 MG/ML IJ SOLN
0.5000 mg | Freq: Once | INTRAMUSCULAR | Status: AC
  Administered 2022-10-23: 0.5 mg via INTRAVENOUS
  Filled 2022-10-23: qty 1

## 2022-10-23 MED ORDER — ACETAMINOPHEN 650 MG RE SUPP: 650.0000 mg | Freq: Four times a day (QID) | RECTAL | Status: DC | PRN

## 2022-10-23 MED ORDER — IPRATROPIUM BROMIDE 0.02 % IN SOLN
0.5000 mg | Freq: Once | RESPIRATORY_TRACT | Status: AC
  Administered 2022-10-23: 0.5 mg via RESPIRATORY_TRACT
  Filled 2022-10-23: qty 2.5

## 2022-10-23 MED ORDER — POTASSIUM CHLORIDE CRYS ER 20 MEQ PO TBCR
40.0000 meq | EXTENDED_RELEASE_TABLET | Freq: Every day | ORAL | Status: AC
  Administered 2022-10-23: 40 meq via ORAL
  Filled 2022-10-23: qty 2

## 2022-10-23 MED ORDER — MAGNESIUM SULFATE 2 GM/50ML IV SOLN
2.0000 g | Freq: Once | INTRAVENOUS | Status: AC
  Administered 2022-10-23: 2 g via INTRAVENOUS
  Filled 2022-10-23: qty 50

## 2022-10-23 MED ORDER — ACETAMINOPHEN 325 MG PO TABS
650.0000 mg | ORAL_TABLET | Freq: Four times a day (QID) | ORAL | Status: DC | PRN
  Administered 2022-10-23: 650 mg via ORAL
  Filled 2022-10-23: qty 2

## 2022-10-23 MED ORDER — PROCHLORPERAZINE EDISYLATE 10 MG/2ML IJ SOLN: 10.0000 mg | Freq: Four times a day (QID) | INTRAMUSCULAR | Status: DC | PRN

## 2022-10-23 MED ORDER — GUAIFENESIN ER 600 MG PO TB12
600.0000 mg | ORAL_TABLET | Freq: Two times a day (BID) | ORAL | Status: DC
  Administered 2022-10-23 – 2022-10-26 (×7): 600 mg via ORAL
  Filled 2022-10-23 (×7): qty 1

## 2022-10-23 MED ORDER — AMLODIPINE BESYLATE 5 MG PO TABS
2.5000 mg | ORAL_TABLET | Freq: Every day | ORAL | Status: DC
  Administered 2022-10-23: 2.5 mg via ORAL
  Filled 2022-10-23: qty 1

## 2022-10-23 MED ORDER — ALBUTEROL SULFATE (2.5 MG/3ML) 0.083% IN NEBU
2.5000 mg | INHALATION_SOLUTION | RESPIRATORY_TRACT | Status: DC | PRN
  Administered 2022-10-24 (×3): 2.5 mg via RESPIRATORY_TRACT
  Filled 2022-10-23 (×4): qty 3

## 2022-10-23 MED ORDER — ALBUTEROL SULFATE (2.5 MG/3ML) 0.083% IN NEBU
10.0000 mg/h | INHALATION_SOLUTION | RESPIRATORY_TRACT | Status: DC
  Administered 2022-10-23: 10 mg/h via RESPIRATORY_TRACT
  Filled 2022-10-23: qty 12

## 2022-10-23 MED ORDER — IPRATROPIUM-ALBUTEROL 0.5-2.5 (3) MG/3ML IN SOLN
3.0000 mL | Freq: Four times a day (QID) | RESPIRATORY_TRACT | Status: DC
  Administered 2022-10-23 – 2022-10-24 (×4): 3 mL via RESPIRATORY_TRACT
  Filled 2022-10-23 (×3): qty 3

## 2022-10-23 MED ORDER — PREDNISONE 20 MG PO TABS
40.0000 mg | ORAL_TABLET | Freq: Every day | ORAL | Status: DC
  Administered 2022-10-23 – 2022-10-24 (×2): 40 mg via ORAL
  Filled 2022-10-23 (×2): qty 2

## 2022-10-23 MED ORDER — POTASSIUM CHLORIDE CRYS ER 20 MEQ PO TBCR
40.0000 meq | EXTENDED_RELEASE_TABLET | Freq: Once | ORAL | Status: AC
  Administered 2022-10-23: 40 meq via ORAL
  Filled 2022-10-23: qty 2

## 2022-10-23 MED ORDER — ENOXAPARIN SODIUM 40 MG/0.4ML IJ SOSY
40.0000 mg | PREFILLED_SYRINGE | INTRAMUSCULAR | Status: DC
  Administered 2022-10-23 – 2022-10-26 (×4): 40 mg via SUBCUTANEOUS
  Filled 2022-10-23 (×4): qty 0.4

## 2022-10-23 NOTE — ED Provider Notes (Signed)
[facility]-EMERGENCY DEPT Provider Note   CSN: [id number] Arrival date & time: 10/23/22  4650     History  Chief Complaint  Patient presents with   Respiratory Distress    Franklin Chavez is a 61 y.o. male.  The history is provided by the patient, the EMS personnel and medical records.  Franklin Chavez is a 61 y.o. male who presents to the Emergency Department complaining of shortness of breath.  Presents to the emergency department by EMS for evaluation of shortness of breath that started yesterday.  He reports gradual onset shortness of breath without associated cough or pain.  No fever, chest pain, abdominal pain, nausea, vomiting, leg swelling or pain.  He has no known medical problems and takes no medications.  He does not currently see a PCP.  He does use tobacco.  No alcohol or drug use.  No personal history of DVT/PE.    EMS report that he was treated with 1 DuoNeb as well as Solu-Medrol prior to ED arrival. Home Medications Prior to Admission medications   Medication Sig Start Date End Date Taking? Authorizing Provider  doxycycline (VIBRAMYCIN) 50 MG capsule Take 1 capsule (50 mg total) by mouth 2 (two) times daily. 12/14/18   Dominica Severin, MD  HYDROcodone-acetaminophen (NORCO) 5-325 MG tablet Take 1 tablet by mouth every 6 (six) hours as needed for moderate pain. 12/14/18   Dominica Severin, MD      Allergies    Patient has no known allergies.    Review of Systems   Review of Systems  All other systems reviewed and are negative.   Physical Exam Updated Vital Signs BP (!) 194/92   Pulse 81   Temp 98.1 F (36.7 C) (Oral)   Resp 16   SpO2 99%  Physical Exam Vitals and nursing note reviewed.  Constitutional:      Appearance: He is well-developed.  HENT:     Head: Normocephalic and atraumatic.  Cardiovascular:     Rate and Rhythm: Normal rate and regular rhythm.     Heart sounds: No murmur heard. Pulmonary:     Comments: Mildly  increased work of breathing.  There are expiratory wheezes in the right lung fields.  There are occasional wheezes and decreased air movement in the left lung fields Abdominal:     Palpations: Abdomen is soft.     Tenderness: There is no abdominal tenderness. There is no guarding or rebound.  Musculoskeletal:        General: No swelling or tenderness.  Skin:    General: Skin is warm and dry.  Neurological:     Mental Status: He is alert and oriented to person, place, and time.  Psychiatric:        Behavior: Behavior normal.     ED Results / Procedures / Treatments   Labs (all labs ordered are listed, but only abnormal results are displayed) Labs Reviewed  BASIC METABOLIC PANEL - Abnormal; Notable for the following components:      Result Value   Glucose, Bld 166 (*)    Calcium 8.4 (*)    All other components within normal limits  CBC WITH DIFFERENTIAL/PLATELET - Abnormal; Notable for the following components:   WBC 12.0 (*)    Neutro Abs 9.1 (*)    Eosinophils Absolute 0.8 (*)    All other components within normal limits  BLOOD GAS, VENOUS - Abnormal; Notable for the following components:   pO2, Ven 56 (*)  Bicarbonate 30.1 (*)    Acid-Base Excess 2.8 (*)    All other components within normal limits  RESP PANEL BY RT-PCR (FLU A&B, COVID) ARPGX2  D-DIMER, QUANTITATIVE  HEPATIC FUNCTION PANEL  LIPASE, BLOOD  TROPONIN I (HIGH SENSITIVITY)    EKG EKG Interpretation  Date/Time:  Monday October 23 2022 04:41:51 EST Ventricular Rate:  76 PR Interval:  154 QRS Duration: 96 QT Interval:  428 QTC Calculation: 482 R Axis:   100 Text Interpretation: Sinus rhythm Right axis deviation Consider left ventricular hypertrophy Borderline prolonged QT interval Confirmed by Tilden Fossa [phone]) on 10/23/2022 4:43:54 AM  Radiology DG Chest Port 1 View  Result Date: 10/23/2022 CLINICAL DATA:  Shortness of breath. EXAM: PORTABLE CHEST 1 VIEW COMPARISON:  10/05/2022 FINDINGS:  Heart size and mediastinal contours are unremarkable. No pleural effusion or edema identified. No airspace opacities. Visualized osseous structures are unremarkable. IMPRESSION: No active disease. Electronically Signed   By: Signa Kell M.D.   On: 10/23/2022 05:19    Procedures Procedures    Medications Ordered in ED Medications  albuterol (PROVENTIL) (2.5 MG/3ML) 0.083% nebulizer solution (10 mg/hr Nebulization New Bag/Given 10/23/22 0544)  LORazepam (ATIVAN) injection 0.5 mg (has no administration in time range)  magnesium sulfate IVPB 2 g 50 mL (2 g Intravenous New Bag/Given 10/23/22 0555)  ipratropium (ATROVENT) nebulizer solution 0.5 mg (0.5 mg Nebulization Given 10/23/22 0544)    ED Course/ Medical Decision Making/ A&P                           Medical Decision Making Amount and/or Complexity of Data Reviewed Labs: ordered. Radiology: ordered.  Risk Prescription drug management.   Patient with no significant medical history here for evaluation of shortness of breath.  Patient with wheezing and decreased air movement on initial assessment.  Patient did have worsening on reevaluation and required continuous nebulizer treatment.  During his continuous treatment he did start to feel improved but has persistent decreased air movement bilaterally.  He did develop some right upper quadrant abdominal pain as well as cramping in his arms and legs.  On repeat questioning he does report drinking alcohol occasionally and recently using crack cocaine.  We will treat with a dose of Ativan for his cramping and crack cocaine use.  We will add on hepatic panel given his new right upper quadrant pain.  Patient care transferred pending additional labs and reevaluation.    Current clinical picture is not consistent with ACS, PE, dissection, pneumonia, acute CHF.  Chest x-ray is without acute abnormality, images personally reviewed and interpreted, agree with radiologist  interpretation.        Final Clinical Impression(s) / ED Diagnoses Final diagnoses:  None    Rx / DC Orders ED Discharge Orders     None         Tilden Fossa, MD 10/23/22 [phone]

## 2022-10-23 NOTE — Progress Notes (Signed)
Pt is very weak, but removed O2 & TELE box to go to restroom by self. Discussed importance of calling staff again (2nd time since admission). Assisted back to bed, applied oxygen & TELE box.  Informed pt. Bed alarm is set & will alarm without staff assistance. Pt verbalized understanding.

## 2022-10-23 NOTE — H&P (Signed)
History and Physical    Patient: Franklin Chavez [id number]:[id number] DOB: March 20, 1961 DOA: 10/23/2022 DOS: the patient was seen and examined on 10/23/2022 PCP: Patient, No Pcp Per  Patient coming from: Home  Chief Complaint:  Chief Complaint  Patient presents with   Respiratory Distress   HPI: Franklin Chavez is a 61 y.o. male with medical history significant of presentation, tobacco use disorder, COPD who is coming to the emergency department with complaints of and productive cough for the past 2 weeks with of fever, chills or night sweats.  However, since yesterday patient is became more dyspneic and has been having wheezing.  EMS was called home.  He received a DuoNeb and Solu-Medrol 125 mg IVP.  While in the emergency department, he had a desaturation episode where his O2 sat 86%. He denied rhinorrhea, sore throat, wheezing or hemoptysis.  No chest pain, palpitations, diaphoresis, PND, orthopnea or pitting edema of the lower extremities.  No abdominal pain, nausea, emesis, diarrhea, constipation, melena or hematochezia.  No flank pain, dysuria, frequency or hematuria.  No polyuria, polydipsia, polyphagia or blurred vision.   ED course: Initial vital signs were temperature 98.1 F, pulse 72, respiration 26, BP 194/92 mmHg and O2 sat 100% on aerosol mask.  The patient received a continuous albuterol 10+ ipratropium 0.5 mg x 1, lorazepam 0.5 mg IVP and magnesium sulfate 2 g IVPB.  Lab work: Venous blood gas normal pH, PCO2 and O2 sat.  PO2 56 mmHg, bicarbonate 30.1 and acid-based excess 2.8 mmol/L.  CBC showed a white count 12.0, hemoglobin 14.8 g/dL platelets 299.  Normal D-dimer.  Normal troponin, lipase and LFTs.  Coronavirus and influenza PCR were negative.  BMP with a glucose of 166 and calcium 8.4 mg/dL.  The rest of the BMP measurements were normal.  EKG showed borderline QT prolongation.  Imaging: Portable 1 view chest radiograph with no active disease.  Normal heart size and mediastinal  contours.   Review of Systems: As mentioned in the history of present illness. All other systems reviewed and are negative.  Past Medical History:  Diagnosis Date   Poor dentition 12/13/2018   Tobacco use disorder 12/13/2018   History reviewed. No pertinent surgical history. Social History:  reports that he has been smoking. He has never used smokeless tobacco. He reports that he does not drink alcohol and does not use drugs.  No Known Allergies  Family History  Problem Relation Age of Onset   Diabetes Mellitus II Mother    Hypertension Mother     Prior to Admission medications   Medication Sig Start Date End Date Taking? Authorizing Provider  doxycycline (VIBRAMYCIN) 50 MG capsule Take 1 capsule (50 mg total) by mouth 2 (two) times daily. 12/14/18   Dominica Severin, MD  HYDROcodone-acetaminophen (NORCO) 5-325 MG tablet Take 1 tablet by mouth every 6 (six) hours as needed for moderate pain. 12/14/18   Dominica Severin, MD    Physical Exam: Vitals:   10/23/22 0718 10/23/22 0745 10/23/22 0800 10/23/22 0845  BP: (!) 170/92 (!) 141/82 (!) 156/92 (!) 143/79  Pulse: 82 72 74 68  Resp: 18 18 18 17   Temp: 98.4 F (36.9 C)     TempSrc: Oral     SpO2: 91% (!) 86% (!) 86% 92%  Weight: 77.1 kg     Height: 6' (1.829 m)      Physical Exam Vitals and nursing note reviewed.  Constitutional:      Appearance: Normal appearance. He is normal weight.  HENT:     Head: Normocephalic.     Nose: No rhinorrhea.     Mouth/Throat:     Mouth: Mucous membranes are moist.  Eyes:     General: No scleral icterus.    Pupils: Pupils are equal, round, and reactive to light.  Neck:     Vascular: No JVD.  Cardiovascular:     Rate and Rhythm: Normal rate and regular rhythm.     Heart sounds: S1 normal and S2 normal.  Pulmonary:     Effort: No tachypnea, bradypnea or accessory muscle usage.     Breath sounds: Decreased breath sounds and wheezing present. No rhonchi or rales.  Abdominal:      General: Bowel sounds are normal. There is no distension.     Palpations: Abdomen is soft.     Tenderness: There is no abdominal tenderness. There is no guarding.  Musculoskeletal:     Cervical back: Neck supple.     Right lower leg: No edema.     Left lower leg: No edema.  Skin:    General: Skin is warm and dry.  Neurological:     General: No focal deficit present.     Mental Status: He is alert and oriented to person, place, and time.  Psychiatric:        Mood and Affect: Mood normal.        Behavior: Behavior normal.   Data Reviewed:  There are no new results to review at this time.  Assessment and Plan: Principal Problem:   COPD with acute exacerbation (HCC) Observation/telemetry Continue supplemental oxygen. Methylprednisolone 125 mg IVP x1 given by EMS. Magnesium sulfate given earlier. Followed by prednisone 40 mg p.o. daily in a.m. Scheduled and as needed bronchodilators. Follow-up CBC and chemistry in the morning.   Active Problems:   Tobacco use disorder Declined nicotine replacement therapy. Nicotine replacement therapy as needed. Tobacco cessation advised.    Hyperglycemia Recheck fasting glucose.    Hypocalcemia Recheck calcium in AM. Further work-up depending on results.    Hypertension Begin amlodipine 2.5 mg p.o. daily. Monitor blood pressure and heart rate.    Borderline QT prolongation Avoid QT prolonging meds as possible. KCl 40 mEq p.o. twice daily x2. Magnesium sulfate 2 g IVPB given Keep electrolytes optimized. Check EKG in the morning.       Advance Care Planning:   Code Status: Full Code   Consults:   Family Communication:   Severity of Illness: The appropriate patient status for this patient is OBSERVATION. Observation status is judged to be reasonable and necessary in order to provide the required intensity of service to ensure the patient's safety. The patient's presenting symptoms, physical exam findings, and initial  radiographic and laboratory data in the context of their medical condition is felt to place them at decreased risk for further clinical deterioration. Furthermore, it is anticipated that the patient will be medically stable for discharge from the hospital within 2 midnights of admission.   Author: Bobette Mo, MD 10/23/2022 9:36 AM  For on call review [email or web address].   This document was prepared using Dragon voice recognition software and may contain some unintended transcription errors.

## 2022-10-23 NOTE — ED Triage Notes (Signed)
Pt comes from home via [facility] for resp distress, originally absent breath sound on the L, now has wheezing all over. PTA received one duoneb and 125 of solumedrol

## 2022-10-23 NOTE — ED Notes (Signed)
Patient O2 is remaining between 87-90% on room air. Nurse places patient on 2L O2, O2 staying ain lower to mid 90s.

## 2022-10-23 NOTE — ED Provider Notes (Signed)
Patient initially seen by Dr. Madilyn Hook.  Please see her note.  Patient noted to have respiratory difficulty wheezing.  Cardiac enzymes are unremarkable.  Lipase D-dimer and LFTs unremarkable.  Chest x-ray without acute findings.  Patient does have an oxygen requirement.  I will consult the medical service for admission.  Case discussed with Dr Gracelyn Nurse, MD 10/23/22 [phone]

## 2022-10-24 DIAGNOSIS — J441 Chronic obstructive pulmonary disease with (acute) exacerbation: Secondary | ICD-10-CM | POA: Insufficient documentation

## 2022-10-24 DIAGNOSIS — R61 Generalized hyperhidrosis: Secondary | ICD-10-CM | POA: Diagnosis present

## 2022-10-24 DIAGNOSIS — Z716 Tobacco abuse counseling: Secondary | ICD-10-CM | POA: Diagnosis not present

## 2022-10-24 DIAGNOSIS — I10 Essential (primary) hypertension: Secondary | ICD-10-CM | POA: Diagnosis present

## 2022-10-24 DIAGNOSIS — R451 Restlessness and agitation: Secondary | ICD-10-CM | POA: Diagnosis not present

## 2022-10-24 DIAGNOSIS — M1990 Unspecified osteoarthritis, unspecified site: Secondary | ICD-10-CM | POA: Diagnosis not present

## 2022-10-24 DIAGNOSIS — R739 Hyperglycemia, unspecified: Secondary | ICD-10-CM | POA: Diagnosis present

## 2022-10-24 DIAGNOSIS — X58XXXA Exposure to other specified factors, initial encounter: Secondary | ICD-10-CM | POA: Diagnosis present

## 2022-10-24 DIAGNOSIS — F17200 Nicotine dependence, unspecified, uncomplicated: Secondary | ICD-10-CM | POA: Diagnosis present

## 2022-10-24 DIAGNOSIS — R9431 Abnormal electrocardiogram [ECG] [EKG]: Secondary | ICD-10-CM | POA: Diagnosis present

## 2022-10-24 DIAGNOSIS — Z1152 Encounter for screening for COVID-19: Secondary | ICD-10-CM | POA: Diagnosis not present

## 2022-10-24 DIAGNOSIS — I159 Secondary hypertension, unspecified: Secondary | ICD-10-CM | POA: Diagnosis not present

## 2022-10-24 DIAGNOSIS — E876 Hypokalemia: Secondary | ICD-10-CM | POA: Diagnosis present

## 2022-10-24 DIAGNOSIS — Z8249 Family history of ischemic heart disease and other diseases of the circulatory system: Secondary | ICD-10-CM | POA: Diagnosis not present

## 2022-10-24 DIAGNOSIS — M1909 Primary osteoarthritis, other specified site: Secondary | ICD-10-CM | POA: Diagnosis present

## 2022-10-24 DIAGNOSIS — Z79899 Other long term (current) drug therapy: Secondary | ICD-10-CM | POA: Diagnosis not present

## 2022-10-24 DIAGNOSIS — J9601 Acute respiratory failure with hypoxia: Secondary | ICD-10-CM | POA: Diagnosis present

## 2022-10-24 DIAGNOSIS — Z833 Family history of diabetes mellitus: Secondary | ICD-10-CM | POA: Diagnosis not present

## 2022-10-24 DIAGNOSIS — T380X5A Adverse effect of glucocorticoids and synthetic analogues, initial encounter: Secondary | ICD-10-CM | POA: Diagnosis present

## 2022-10-24 LAB — HIV ANTIBODY (ROUTINE TESTING W REFLEX): HIV Screen 4th Generation wRfx: NONREACTIVE

## 2022-10-24 LAB — MRSA NEXT GEN BY PCR, NASAL: MRSA by PCR Next Gen: NOT DETECTED

## 2022-10-24 MED ORDER — DEXTROMETHORPHAN POLISTIREX ER 30 MG/5ML PO SUER
30.0000 mg | Freq: Two times a day (BID) | ORAL | Status: DC
  Administered 2022-10-24 – 2022-10-26 (×5): 30 mg via ORAL
  Filled 2022-10-24 (×5): qty 5

## 2022-10-24 MED ORDER — HYDRALAZINE HCL 25 MG PO TABS
25.0000 mg | ORAL_TABLET | Freq: Three times a day (TID) | ORAL | Status: DC
  Administered 2022-10-24 – 2022-10-25 (×4): 25 mg via ORAL
  Filled 2022-10-24 (×4): qty 1

## 2022-10-24 MED ORDER — LORAZEPAM 0.5 MG PO TABS
0.5000 mg | ORAL_TABLET | Freq: Four times a day (QID) | ORAL | Status: DC | PRN
  Administered 2022-10-24: 0.5 mg via ORAL
  Filled 2022-10-24: qty 1

## 2022-10-24 MED ORDER — DEXTROMETHORPHAN POLISTIREX ER 30 MG/5ML PO SUER: 30.0000 mg | Freq: Two times a day (BID) | ORAL | Status: DC | PRN

## 2022-10-24 MED ORDER — AMLODIPINE BESYLATE 5 MG PO TABS
5.0000 mg | ORAL_TABLET | Freq: Every day | ORAL | Status: DC
  Administered 2022-10-24 – 2022-10-25 (×2): 5 mg via ORAL
  Filled 2022-10-24 (×2): qty 1

## 2022-10-24 MED ORDER — METHYLPREDNISOLONE SODIUM SUCC 125 MG IJ SOLR
80.0000 mg | Freq: Every day | INTRAMUSCULAR | Status: DC
  Administered 2022-10-24 – 2022-10-25 (×2): 80 mg via INTRAVENOUS
  Filled 2022-10-24 (×2): qty 2

## 2022-10-24 MED ORDER — IPRATROPIUM-ALBUTEROL 0.5-2.5 (3) MG/3ML IN SOLN
3.0000 mL | Freq: Four times a day (QID) | RESPIRATORY_TRACT | Status: DC
  Administered 2022-10-24 – 2022-10-25 (×4): 3 mL via RESPIRATORY_TRACT
  Filled 2022-10-24 (×4): qty 3

## 2022-10-24 MED ORDER — ONDANSETRON HCL 4 MG/2ML IJ SOLN: 4.0000 mg | Freq: Four times a day (QID) | INTRAMUSCULAR | Status: DC | PRN

## 2022-10-24 MED ORDER — BENZONATATE 100 MG PO CAPS
100.0000 mg | ORAL_CAPSULE | Freq: Three times a day (TID) | ORAL | Status: DC
  Administered 2022-10-24 – 2022-10-26 (×7): 100 mg via ORAL
  Filled 2022-10-24 (×7): qty 1

## 2022-10-24 NOTE — TOC Initial Note (Signed)
Transition of Care [facility]) - Initial/Assessment Note    Patient Details  Name: Franklin Chavez MRN: [id number] Date of Birth: Oct 23, 1961  Transition of Care [facility]) CM/SW Contact:    Golda Acre, RN Phone Number: 10/24/2022, 9:19 AM  Clinical Narrative:                   Transition of Care [facility]) Screening Note   Patient Details  Name: Franklin Chavez Date of Birth: July 04, 1961   Transition of Care [facility]) CM/SW Contact:    Golda Acre, RN Phone Number: 10/24/2022, 9:19 AM    Transition of Care Department [facility]) has reviewed patient and no TOC needs have been identified at this time. We will continue to monitor patient advancement through interdisciplinary progression rounds. If new patient transition needs arise, please place a TOC consult.   Expected Discharge Plan: Home/Self Care Barriers to Discharge: Continued Medical Work up   Patient Goals and CMS Choice Patient states their goals for this hospitalization and ongoing recovery are:: to return to my home CMS Medicare.gov Compare Post Acute Care list provided to:: Patient    Expected Discharge Plan and Services Expected Discharge Plan: Home/Self Care   Discharge Planning Services: CM Consult   Living arrangements for the past 2 months: Apartment                                      Prior Living Arrangements/Services Living arrangements for the past 2 months: Apartment Lives with:: Spouse Patient language and need for interpreter reviewed:: Yes Do you feel safe going back to the place where you live?: Yes            Criminal Activity/Legal Involvement Pertinent to Current Situation/Hospitalization: No - Comment as needed  Activities of Daily Living Home Assistive Devices/Equipment: None ADL Screening (condition at time of admission) Patient's cognitive ability adequate to safely complete daily activities?: Yes Is the patient deaf or have difficulty hearing?: No Does the patient have  difficulty seeing, even when wearing glasses/contacts?: No Does the patient have difficulty concentrating, remembering, or making decisions?: Yes Patient able to express need for assistance with ADLs?: No Does the patient have difficulty dressing or bathing?: No Independently performs ADLs?: Yes (appropriate for developmental age) Does the patient have difficulty walking or climbing stairs?: Yes Weakness of Legs: None Weakness of Arms/Hands: None  Permission Sought/Granted                  Emotional Assessment   Attitude/Demeanor/Rapport: Engaged Affect (typically observed): Calm Orientation: : Oriented to Self, Oriented to Place, Oriented to  Time, Oriented to Situation Alcohol / Substance Use: Never Used Psych Involvement: No (comment)  Admission diagnosis:  COPD with acute exacerbation (HCC) [J44.1] Patient Active Problem List   Diagnosis Date Noted   COPD with acute exacerbation (HCC) 10/23/2022   Hyperglycemia 10/23/2022   Hypocalcemia 10/23/2022   Hypertension 10/23/2022   Borderline QT prolongation 10/23/2022   Tobacco use disorder 12/13/2018   Poor dentition 12/13/2018   Foreign body of left middle finger with infection 12/10/2018   PCP:  Patient, No Pcp Per Pharmacy:   [id number] [place], [place] - [address] [place] [address] Phone: [phone] Fax: [phone]     Social Determinants of Health (SDOH) Interventions    Readmission Risk Interventions   No data to display

## 2022-10-24 NOTE — Progress Notes (Signed)
[facility] Progress Note Patient: Franklin Chavez JTT:[id number] DOB: 1961/10/05 DOA: 10/23/2022  DOS: the patient was seen and examined on 10/24/2022  Brief hospital course: PMH of HTN, COPD, active smoker presented to hospital with complaints of cough and shortness of breath ongoing for last 2 weeks.  Also had fever and night sweats. Found to have COPD exacerbation. Assessment and Plan: Acute COPD exacerbation. Likely viral infection. We will check RVP. COVID-negative. Continue Solu-Medrol. Continue DuoNebs. Monitor.  Active smoker.  Nicotine replacement. Monitor.  HTN. Blood pressure mildly elevated. Continue Norvasc.  Prolonged QT initially. Currently QT level normalized. Monitor.  Hypokalemia. Replaced.  Steroid-induced hyperglycemia. Monitor for now.  Agitation. RN reports intermittent agitation Likely the setting of respite distress. As needed Ativan.  Subjective: Continues to have cough.  No nausea no vomiting no fever no chills.  No chest pain.  Continues to have shortness of breath.  Physical Exam: General: in moderate distress;  Cardiovascular: S1 and S2 Present, no Murmur Respiratory: Increased respiratory effort, Bilateral Air entry present, no Crackles, bilateral expiratory  wheezes Abdomen: Bowel Sound present, Non tender  Extremities: no edema Neurology: alert and oriented to time, place, and person   Data Reviewed: I have Reviewed nursing notes, Vitals, and Lab results. Since last encounter, pertinent lab results CBC and BMP   . I have ordered test including CBC, BMP, RVP  .   Disposition: Status is: Inpatient Remains inpatient appropriate because: Need IV steroids and close monitoring  enoxaparin (LOVENOX) injection 40 mg Start: 10/23/22 1000   Family Communication: No one at bedside Level of care: Telemetry Continue telemetry for QT interval monitoring Vitals:   10/24/22 0553 10/24/22 0841 10/24/22 1244 10/24/22 1409  BP: (!)  162/102 (!) 168/96 (!) 166/103   Pulse: 64 70 80   Resp: 17  (!) 22   Temp: 98.5 F (36.9 C)  98 F (36.7 C)   TempSrc: Oral  Oral   SpO2: 100% 96% 98% 94%  Weight:      Height:         Author: Lynden Oxford, MD 10/24/2022 5:23 PM  Please look on [email or web address] to find out who is on call.

## 2022-10-25 DIAGNOSIS — E876 Hypokalemia: Secondary | ICD-10-CM

## 2022-10-25 DIAGNOSIS — M1990 Unspecified osteoarthritis, unspecified site: Secondary | ICD-10-CM | POA: Diagnosis not present

## 2022-10-25 DIAGNOSIS — I159 Secondary hypertension, unspecified: Secondary | ICD-10-CM

## 2022-10-25 DIAGNOSIS — J441 Chronic obstructive pulmonary disease with (acute) exacerbation: Secondary | ICD-10-CM | POA: Diagnosis not present

## 2022-10-25 LAB — CBC
HCT: 41.1 % (ref 39.0–52.0)
Hemoglobin: 13.8 g/dL (ref 13.0–17.0)
MCH: 28.6 pg (ref 26.0–34.0)
MCHC: 33.6 g/dL (ref 30.0–36.0)
MCV: 85.1 fL (ref 80.0–100.0)
Platelets: 351 10*3/uL (ref 150–400)
RBC: 4.83 MIL/uL (ref 4.22–5.81)
RDW: 13.7 % (ref 11.5–15.5)
WBC: 15.2 10*3/uL — ABNORMAL HIGH (ref 4.0–10.5)
nRBC: 0 % (ref 0.0–0.2)

## 2022-10-25 LAB — BASIC METABOLIC PANEL
Anion gap: 8 (ref 5–15)
BUN: 16 mg/dL (ref 6–20)
CO2: 29 mmol/L (ref 22–32)
Calcium: 8.9 mg/dL (ref 8.9–10.3)
Chloride: 101 mmol/L (ref 98–111)
Creatinine, Ser: 0.85 mg/dL (ref 0.61–1.24)
GFR, Estimated: >60 mL/min (ref >60)
Glucose, Bld: 115 mg/dL — ABNORMAL HIGH (ref 70–99)
Potassium: 4.4 mmol/L (ref 3.5–5.1)
Sodium: 138 mmol/L (ref 135–145)

## 2022-10-25 MED ORDER — CELECOXIB 200 MG PO CAPS
200.0000 mg | ORAL_CAPSULE | Freq: Every day | ORAL | Status: DC
  Administered 2022-10-25 – 2022-10-26 (×2): 200 mg via ORAL
  Filled 2022-10-25 (×2): qty 1

## 2022-10-25 MED ORDER — MOMETASONE FURO-FORMOTEROL FUM 100-5 MCG/ACT IN AERO
2.0000 | INHALATION_SPRAY | Freq: Two times a day (BID) | RESPIRATORY_TRACT | Status: DC
  Administered 2022-10-25 – 2022-10-26 (×2): 2 via RESPIRATORY_TRACT
  Filled 2022-10-25: qty 8.8

## 2022-10-25 MED ORDER — AMLODIPINE BESYLATE 10 MG PO TABS
10.0000 mg | ORAL_TABLET | Freq: Every day | ORAL | Status: DC
  Administered 2022-10-26: 10 mg via ORAL
  Filled 2022-10-25: qty 1

## 2022-10-25 MED ORDER — PANTOPRAZOLE SODIUM 40 MG PO TBEC
40.0000 mg | DELAYED_RELEASE_TABLET | Freq: Every day | ORAL | Status: DC
  Administered 2022-10-25 – 2022-10-26 (×2): 40 mg via ORAL
  Filled 2022-10-25 (×2): qty 1

## 2022-10-25 MED ORDER — IPRATROPIUM-ALBUTEROL 0.5-2.5 (3) MG/3ML IN SOLN
3.0000 mL | Freq: Four times a day (QID) | RESPIRATORY_TRACT | Status: DC
  Administered 2022-10-25 – 2022-10-26 (×4): 3 mL via RESPIRATORY_TRACT
  Filled 2022-10-25 (×4): qty 3

## 2022-10-25 MED ORDER — PREDNISONE 20 MG PO TABS
40.0000 mg | ORAL_TABLET | Freq: Every day | ORAL | Status: DC
  Administered 2022-10-26: 40 mg via ORAL
  Filled 2022-10-25: qty 2

## 2022-10-25 NOTE — Assessment & Plan Note (Signed)
Electrolytes have been corrected, with serum K today at 4,4, bicarbonate at 29 and serum cr at 0,85 Ca 8,9

## 2022-10-25 NOTE — Assessment & Plan Note (Signed)
Steroid induced hyperglycemia.  His fasting glucose today is 115 mg/dl.

## 2022-10-25 NOTE — Hospital Course (Signed)
Franklin Chavez was admitted to the hospital with the working diagnosis of COPD exacerbation.   61 yo male with the past medical history of COPD, tobacco abuse, who presented with productive cough, fever, chills, and night sweats for 2 weeks, with worsening dyspnea for 24 hrs, EMS was called and patient was transported to the ED, in the emergency room his 02 saturation was 86% on room air, blood pressure 170/92, HR 82, RR 18, lungs with decreased breath sounds bilaterally with positive wheezing, heart with S1 and S2 present and rhythmic, abdomen with no distention, no lower extremity edema.   VBG pH 7,34/ Pc02 55/ P02 56/ bicarbonate 30/  sat 90.3   Na 140, K 3,5 Cl 105, bicarbonate 27 glucose 106 bun 14 cr 0.98 Wbc 12.0 hgb 14,8 plt 337  Sars covid 19 negative Influenza A and B negative  Chest radiograph with significant hyperinflation with no infiltrates  EKG 76 bpm, right axis deviation, normal intervals (qtc 448), sinus rhythm with poor R R wave progression, no significant ST segment changes, negative T wave V1 to V2. Positive LVH.   Patient was placed on bronchodilator therapy and systemic corticosteroids. Supplemental 02 per [place].   11/23 patient with improvement in his symptoms, plan to discharge home with as needed albuterol and follow up as outpatient.

## 2022-10-25 NOTE — Progress Notes (Signed)
  Progress Note   Patient: Franklin Chavez WUX:[id number] DOB: [birth year] DOA: 10/23/2022     1 DOS: the patient was seen and examined on 10/25/2022   Brief hospital course: Mr. Carmer was admitted to the hospital with the working diagnosis of COPD exacerbation.   61 yo male with the past medical history of COPD, tobacco abuse, who presented with productive cough, fever, chills, and night sweats for 2 weeks, with worsening dyspnea for 24 hrs, EMS was called and patient was transported to the ED, in the emergency room his 02 saturation was 86% on room air, blood pressure 170/92, HR 82, RR 18, lungs with decreased breath sounds bilaterally with positive wheezing, heart with S1 and S2 present and rhythmic, abdomen with no distention, no lower extremity edema.   VBG pH 7,34/ Pc02 55/ P02 56/ bicarbonate 30/  sat 90.3   Na 140, K 3,5 Cl 105, bicarbonate 27 glucose 106 bun 14 cr 0.98 Wbc 12.0 hgb 14,8 plt 337  Sars covid 19 negative Influenza A and B negative  Chest radiograph with significant hyperinflation with no infiltrates  EKG 76 bpm, right axis deviation, normal intervals (qtc 448), sinus rhythm with poor R R wave progression, no significant ST segment changes, negative T wave V1 to V2. Positive LVH.   Patient was placed on bronchodilator therapy and systemic corticosteroids. Supplemental 02 per [place].   Assessment and Plan: * COPD with acute exacerbation (HCC) Patient continue to have dyspnea and not back to his baseline  Plan to continue aggressive bronchodilator therapy with douneb. Systemic and inhaled corticosteroids Anti tussive agents and airway clearing techniques with flutter valve and incentive spirometer.  Continue oxymetry monitoring and supplemental 02 per [place] to keep 02 saturation 92% or greater.   Smoking cessation counseling   Hypertension Continue blood pressure control with amlodipine.  Systolic blood pressure has been 140 to 150 mmHg.   Hyperglycemia Steroid  induced hyperglycemia.  His fasting glucose today is 115 mg/dl.   Hypokalemia Electrolytes have been corrected, with serum K today at 4,4, bicarbonate at 29 and serum cr at 0,85 Ca 8,9   Arthritis Left leg and right arm arthritis  Will add celebrex and GI prophylaxis.  Out of bed to chair tid, pt and ot.         Subjective: Patient with improvement in dyspnea but not back to baseline, continue to have cough and arthritis upper and lower extremities   Physical Exam: Vitals:   10/24/22 2100 10/25/22 0221 10/25/22 0825 10/25/22 1229  BP: (!) 153/91   (!) 147/99  Pulse: 78   73  Resp: 18   20  Temp: 99.1 F (37.3 C)   98.5 F (36.9 C)  TempSrc: Oral   Oral  SpO2: 100% 97% 98% 93%  Weight:      Height:       Neurology awake and alert ENT with mild pallor Cardiovascular with S1 and S2 present and rhythmic with no gallops or murmurs Respiratory with prolonged expiratory phase with no wheezing, rales or rhonchi Abdomen with no distention  No lower extremity edema No joint edema or apparent inflammation  Data Reviewed:    Family Communication: no family at the bedside   Disposition: Status is: Inpatient Remains inpatient appropriate because: COPD   Planned Discharge Destination: Home     Author: Coralie Keens, MD 10/25/2022 1:49 PM  For on call review [email or web address].

## 2022-10-25 NOTE — Assessment & Plan Note (Signed)
Left leg and right arm arthritis  Will add celebrex and GI prophylaxis.  Out of bed to chair tid, pt and ot.

## 2022-10-25 NOTE — Progress Notes (Signed)
Mobility Specialist - Progress Note  During mobility: 96% SpO2 Post-mobility: 161/79(103) mmHg BP, 98% SPO2   10/25/22 1325  Oxygen Therapy  O2 Device Nasal Cannula  O2 Flow Rate (L/min) 2 L/min  Mobility  Activity Ambulated independently in hallway  Level of Assistance Independent after set-up  Assistive Device None  Distance Ambulated (ft) 50 ft  Activity Response Tolerated fair  Mobility Referral Yes  $Mobility charge 1 Mobility   Pt was found sitting on bench in room and agreeable to ambulate. After a couple of steps in hallway became unsteady and was swaying from side to side. Pt was asked if they felt dizzy or lightheaded and stated feeling lightheaded. Pt was advised to sit and after sitting for about 3 min was able to return to bed but remained unsteady. When returning to bed stated feeling better and at EOS was left with necessities in reach.   Billey Chang Mobility Specialist

## 2022-10-25 NOTE — Assessment & Plan Note (Addendum)
Patient will continue blood pressure control with amlodipine, will need close follow up as outpatient.

## 2022-10-25 NOTE — Assessment & Plan Note (Addendum)
Patient has placed on systemic and inhaled corticosteroids Aggressive bronchodilator therapy and supplemental 02 per [place].  Anti tussive agents and airway clearing techniques with flutter valve and incentive spirometer.   Acute hypoxemic respiratory failure  His symptoms have improved, he has been ambulating with no dyspnea. His 02 saturation is 99% on room air.   Smoking cessation counseling

## 2022-10-26 ENCOUNTER — Other Ambulatory Visit: Payer: Self-pay

## 2022-10-26 DIAGNOSIS — E876 Hypokalemia: Secondary | ICD-10-CM | POA: Diagnosis not present

## 2022-10-26 DIAGNOSIS — M1990 Unspecified osteoarthritis, unspecified site: Secondary | ICD-10-CM | POA: Diagnosis not present

## 2022-10-26 DIAGNOSIS — J441 Chronic obstructive pulmonary disease with (acute) exacerbation: Secondary | ICD-10-CM | POA: Diagnosis not present

## 2022-10-26 DIAGNOSIS — I159 Secondary hypertension, unspecified: Secondary | ICD-10-CM | POA: Diagnosis not present

## 2022-10-26 MED ORDER — SIMETHICONE 80 MG PO CHEW
80.0000 mg | CHEWABLE_TABLET | Freq: Four times a day (QID) | ORAL | Status: AC | PRN
  Administered 2022-10-26: 80 mg via ORAL

## 2022-10-26 MED ORDER — IPRATROPIUM-ALBUTEROL 0.5-2.5 (3) MG/3ML IN SOLN
3.0000 mL | Freq: Three times a day (TID) | RESPIRATORY_TRACT | Status: DC
  Administered 2022-10-26: 3 mL via RESPIRATORY_TRACT
  Filled 2022-10-26: qty 3

## 2022-10-26 MED ORDER — ALBUTEROL SULFATE HFA 108 (90 BASE) MCG/ACT IN AERS: 1.0000 | INHALATION_SPRAY | Freq: Four times a day (QID) | RESPIRATORY_TRACT | Status: DC | PRN

## 2022-10-26 MED ORDER — AMLODIPINE BESYLATE 10 MG PO TABS: 10.0000 mg | ORAL_TABLET | Freq: Every day | ORAL | 0 refills | Status: DC

## 2022-10-26 MED ORDER — SIMETHICONE 80 MG PO CHEW
80.0000 mg | CHEWABLE_TABLET | Freq: Once | ORAL | Status: DC
  Filled 2022-10-26: qty 1

## 2022-10-26 MED ORDER — ALBUTEROL SULFATE HFA 108 (90 BASE) MCG/ACT IN AERS: 1.0000 | INHALATION_SPRAY | Freq: Four times a day (QID) | RESPIRATORY_TRACT | 0 refills | Status: DC | PRN

## 2022-10-26 MED ORDER — ALBUTEROL SULFATE (2.5 MG/3ML) 0.083% IN NEBU: 2.5000 mg | INHALATION_SOLUTION | Freq: Four times a day (QID) | RESPIRATORY_TRACT | Status: DC | PRN

## 2022-10-26 NOTE — Evaluation (Signed)
Occupational Therapy Evaluation/Discharge Patient Details Name: Franklin Chavez MRN: [id number] DOB: 05-21-1961 Today's Date: 10/26/2022   History of Present Illness Pt is a 61 y/o male admitted for acute COPD exacerbation. PMH: tobacco use, COPD.   Clinical Impression   PTA, pt currently with housing insecurity and independent in all daily tasks without need for AD. Pt presents now feeling much better today than initial presentation. Pt able to demo long distance hallway mobility x 2 with and without O2. Pt maintained SpO2 at 95% on RA with activity. Pt also independent with ADLs. Provided energy conservation education and handout with pt verbalizing understanding. No further skilled therapy needs at this time. Pt would benefit from mobility specialist's service while admitted to maximize endurance and activity.       Recommendations for follow up therapy are one component of a multi-disciplinary discharge planning process, led by the attending physician.  Recommendations may be updated based on patient status, additional functional criteria and insurance authorization.   Follow Up Recommendations  No OT follow up     Assistance Recommended at Discharge None  Patient can return home with the following      Functional Status Assessment  Patient has not had a recent decline in their functional status  Equipment Recommendations  None recommended by OT    Recommendations for Other Services       Precautions / Restrictions Precautions Precautions: None Restrictions Weight Bearing Restrictions: No      Mobility Bed Mobility Overal bed mobility: Independent                  Transfers Overall transfer level: Independent Equipment used: None                      Balance Overall balance assessment: No apparent balance deficits (not formally assessed)                                         ADL either performed or assessed with clinical  judgement   ADL Overall ADL's : Independent                                       General ADL Comments: able to manage LB dressing, has been going to bathroom without assist. able to mobilize in hallway Independently with O2 and then on RA without desats. Provided energy conservation handout and education for symptom self monitoring. Pt requesting assist for housing and disability resources - SW aware.     Vision Baseline Vision/History: 0 No visual deficits Ability to See in Adequate Light: 0 Adequate Patient Visual Report: No change from baseline Vision Assessment?: No apparent visual deficits     Perception     Praxis      Pertinent Vitals/Pain Pain Assessment Pain Assessment: No/denies pain     Hand Dominance Right   Extremity/Trunk Assessment Upper Extremity Assessment Upper Extremity Assessment: Overall WFL for tasks assessed   Lower Extremity Assessment Lower Extremity Assessment: Overall WFL for tasks assessed   Cervical / Trunk Assessment Cervical / Trunk Assessment: Normal   Communication Communication Communication: No difficulties   Cognition Arousal/Alertness: Awake/alert Behavior During Therapy: WFL for tasks assessed/performed Overall Cognitive Status: Within Functional Limits for tasks assessed  General Comments       Exercises     Shoulder Instructions      Home Living Family/patient expects to be discharged to:: Shelter/Homeless                                 Additional Comments: reports being homeless for past 3 months, previously was staying with sister but reports he cannot go back there.      Prior Functioning/Environment Prior Level of Function : Independent/Modified Independent             Mobility Comments: used to work in home improvement, still works a little per pt          OT Problem List:        OT Treatment/Interventions:       OT Goals(Current goals can be found in the care plan section) Acute Rehab OT Goals Patient Stated Goal: find somewhere to stay OT Goal Formulation: All assessment and education complete, DC therapy  OT Frequency:      Co-evaluation              AM-PAC OT "6 Clicks" Daily Activity     Outcome Measure Help from another person eating meals?: None Help from another person taking care of personal grooming?: None Help from another person toileting, which includes using toliet, bedpan, or urinal?: None Help from another person bathing (including washing, rinsing, drying)?: None Help from another person to put on and taking off regular upper body clothing?: None Help from another person to put on and taking off regular lower body clothing?: None 6 Click Score: 24   End of Session Equipment Utilized During Treatment: Oxygen Nurse Communication: Mobility status  Activity Tolerance: Patient tolerated treatment well Patient left: in bed;with call bell/phone within reach  OT Visit Diagnosis: Other (comment) (decreased cardiopulmonary tolerance)                Time: 9937-1696 OT Time Calculation (min): 18 min Charges:  OT General Charges $OT Visit: 1 Visit OT Evaluation $OT Eval Low Complexity: 1 Low  Bradd Canary, OTR/L Acute Rehab Services Office: [phone]   Lorre Munroe 10/26/2022, 10:13 AM

## 2022-10-26 NOTE — Discharge Summary (Addendum)
Physician Discharge Summary   Patient: Franklin Chavez MRN: [id number] DOB: 09/25/1961  Admit date:     10/23/2022  Discharge date: 10/26/22  Discharge Physician: York Ram Jonne Rote   PCP: Patient, No Pcp Per   Recommendations at discharge:    Patient will continue bronchodilator therapy with as needed albuterol. Smoking cessation counseling Started on amlodipine for blood pressure control.   Discharge Diagnoses: Principal Problem:   COPD with acute exacerbation (HCC) Active Problems:   Hypertension   Hyperglycemia   Hypokalemia   Arthritis  Resolved Problems:   Borderline QT prolongation  Hospital Course: Franklin Chavez was admitted to the hospital with the working diagnosis of COPD exacerbation.   61 yo male with the past medical history of COPD, tobacco abuse, who presented with productive cough, fever, chills, and night sweats for 2 weeks, with worsening dyspnea for 24 hrs, EMS was called and patient was transported to the ED, in the emergency room his 02 saturation was 86% on room air, blood pressure 170/92, HR 82, RR 18, lungs with decreased breath sounds bilaterally with positive wheezing, heart with S1 and S2 present and rhythmic, abdomen with no distention, no lower extremity edema.   VBG pH 7,34/ Pc02 55/ P02 56/ bicarbonate 30/  sat 90.3   Na 140, K 3,5 Cl 105, bicarbonate 27 glucose 106 bun 14 cr 0.98 Wbc 12.0 hgb 14,8 plt 337  Sars covid 19 negative Influenza A and B negative  Chest radiograph with significant hyperinflation with no infiltrates  EKG 76 bpm, right axis deviation, normal intervals (qtc 448), sinus rhythm with poor R R wave progression, no significant ST segment changes, negative T wave V1 to V2. Positive LVH.   Patient was placed on bronchodilator therapy and systemic corticosteroids. Supplemental 02 per [place].   Assessment and Plan: * COPD with acute exacerbation (HCC) Patient has placed on systemic and inhaled corticosteroids Aggressive  bronchodilator therapy and supplemental 02 per [place].  Anti tussive agents and airway clearing techniques with flutter valve and incentive spirometer.   Acute hypoxemic respiratory failure  His symptoms have improved, he has been ambulating with no dyspnea. His 02 saturation is 99% on room air.   Smoking cessation counseling   Hypertension Patient will continue blood pressure control with amlodipine, will need close follow up as outpatient.   Hyperglycemia Steroid induced hyperglycemia.  His fasting glucose today is 115 mg/dl.   Hypokalemia Electrolytes have been corrected, with serum K today at 4,4, bicarbonate at 29 and serum cr at 0,85 Ca 8,9   Arthritis Left leg and right arm arthritis  Symptoms improved with short course of celebrex.          Consultants: none  Procedures performed: none   Disposition: Home Diet recommendation:  Cardiac diet DISCHARGE MEDICATION: Allergies as of 10/26/2022   No Known Allergies      Medication List     TAKE these medications    albuterol 108 (90 Base) MCG/ACT inhaler Commonly known as: VENTOLIN HFA Inhale 1-2 puffs into the lungs every 6 (six) hours as needed for wheezing or shortness of breath.   amLODipine 10 MG tablet Commonly known as: NORVASC Take 1 tablet (10 mg total) by mouth daily. Start taking on: October 27, 2022        Discharge Exam: Ceasar Mons Weights   10/23/22 0718  Weight: 77.1 kg   BP 133/77 (BP Location: Right Arm)   Pulse 63   Temp 98 F (36.7 C) (Oral)   Resp 18  Ht 6' (1.829 m)   Wt 77.1 kg   SpO2 99%   BMI 23.06 kg/m   Patient with improvement in his dyspnea, this am he walked out of the hospital and after a few hours he returned.   Neurology awake and alert ENT with mild pallor Cardiovascular with S1 and S2 present and rhythmic Respiratory with no rales of wheezing Abdomen with no distention  No lower extremity edema   Condition at discharge: stable  The results of  significant diagnostics from this hospitalization (including imaging, microbiology, ancillary and laboratory) are listed below for reference.   Imaging Studies: DG Chest Port 1 View  Result Date: 10/23/2022 CLINICAL DATA:  Shortness of breath. EXAM: PORTABLE CHEST 1 VIEW COMPARISON:  10/05/2022 FINDINGS: Heart size and mediastinal contours are unremarkable. No pleural effusion or edema identified. No airspace opacities. Visualized osseous structures are unremarkable. IMPRESSION: No active disease. Electronically Signed   By: Signa Kell M.D.   On: 10/23/2022 05:19   DG Chest 2 View  Result Date: 10/05/2022 CLINICAL DATA:  Coughing and chest pain. EXAM: CHEST - 2 VIEW COMPARISON:  PA Lat 12/13/2016 FINDINGS: The heart size and mediastinal contours are within normal limits. Both lungs are mildly emphysematous but clear. The visualized skeletal structures are intact, with slight thoracic dextroscoliosis. IMPRESSION: No evidence of acute chest disease.  Emphysema. Electronically Signed   By: Almira Bar M.D.   On: 10/05/2022 23:21    Microbiology: Results for orders placed or performed during the hospital encounter of 10/23/22  Resp Panel by RT-PCR (Flu A&B, Covid) Anterior Nasal Swab     Status: None   Collection Time: 10/23/22  4:42 AM   Specimen: Anterior Nasal Swab  Result Value Ref Range Status   SARS Coronavirus 2 by RT PCR NEGATIVE NEGATIVE Final    Comment: (NOTE) SARS-CoV-2 target nucleic acids are NOT DETECTED.  The SARS-CoV-2 RNA is generally detectable in upper respiratory specimens during the acute phase of infection. The lowest concentration of SARS-CoV-2 viral copies this assay can detect is 138 copies/mL. A negative result does not preclude SARS-Cov-2 infection and should not be used as the sole basis for treatment or other patient management decisions. A negative result may occur with  improper specimen collection/handling, submission of specimen other than  nasopharyngeal swab, presence of viral mutation(s) within the areas targeted by this assay, and inadequate number of viral copies(<138 copies/mL). A negative result must be combined with clinical observations, patient history, and epidemiological information. The expected result is Negative.  Fact Sheet for Patients:  BloggerCourse.com  Fact Sheet for Healthcare Providers:  SeriousBroker.it  This test is no t yet approved or cleared by the [place] FDA and  has been authorized for detection and/or diagnosis of SARS-CoV-2 by FDA under an Emergency Use Authorization (EUA). This EUA will remain  in effect (meaning this test can be used) for the duration of the COVID-19 declaration under Section 564(b)(1) of the Act, 21 U.S.C.section 360bbb-3(b)(1), unless the authorization is terminated  or revoked sooner.       Influenza A by PCR NEGATIVE NEGATIVE Final   Influenza B by PCR NEGATIVE NEGATIVE Final    Comment: (NOTE) The Xpert Xpress SARS-CoV-2/FLU/RSV plus assay is intended as an aid in the diagnosis of influenza from Nasopharyngeal swab specimens and should not be used as a sole basis for treatment. Nasal washings and aspirates are unacceptable for Xpert Xpress SARS-CoV-2/FLU/RSV testing.  Fact Sheet for Patients: BloggerCourse.com  Fact Sheet for Healthcare Providers:  SeriousBroker.it  This test is not yet approved or cleared by the [place] and has been authorized for detection and/or diagnosis of SARS-CoV-2 by FDA under an Emergency Use Authorization (EUA). This EUA will remain in effect (meaning this test can be used) for the duration of the COVID-19 declaration under Section 564(b)(1) of the Act, 21 U.S.C. section 360bbb-3(b)(1), unless the authorization is terminated or revoked.  Performed at [facility], [address], [place], [place] [address]   MRSA Next Gen by PCR, Nasal     Status: None   Collection Time: 10/24/22  3:15 AM   Specimen: Nasal Mucosa; Nasal Swab  Result Value Ref Range Status   MRSA by PCR Next Gen NOT DETECTED NOT DETECTED Final    Comment: (NOTE) The GeneXpert MRSA Assay (FDA approved for NASAL specimens only), is one component of a comprehensive MRSA colonization surveillance program. It is not intended to diagnose MRSA infection nor to guide or monitor treatment for MRSA infections. Test performance is not FDA approved in patients less than 61 years old. Performed at [facility], [address] [place], [place] [address]     Labs: CBC: Recent Labs  Lab 10/23/22 0442 10/25/22 0401  WBC 12.0* 15.2*  NEUTROABS 9.1*  --   HGB 14.8 13.8  HCT 43.4 41.1  MCV 84.9 85.1  PLT 337 351   Basic Metabolic Panel: Recent Labs  Lab 10/23/22 0442 10/25/22 0401  NA 140 138  K 3.5 4.4  CL 105 101  CO2 27 29  GLUCOSE 166* 115*  BUN 14 16  CREATININE 0.98 0.85  CALCIUM 8.4* 8.9   Liver Function Tests: Recent Labs  Lab 10/23/22 0442  AST 37  ALT 29  ALKPHOS 42  BILITOT 0.4  PROT 7.1  ALBUMIN 3.6   CBG: No results for input(s): "GLUCAP" in the last 168 hours.  Discharge time spent: greater than 30 minutes.  Signed: Coralie Keens, MD [facility] 10/26/2022

## 2022-10-26 NOTE — Progress Notes (Signed)
CSW went to speak to the patient however he was sleep. CSW has also added resources to this patient chart. CSW gave his nurse tech a part pass and bus pass. TOC will continue to follow DC needs.

## 2022-10-28 ENCOUNTER — Other Ambulatory Visit: Payer: Self-pay

## 2022-10-28 ENCOUNTER — Emergency Department (HOSPITAL_COMMUNITY)
Admission: EM | Admit: 2022-10-28 | Discharge: 2022-10-29 | Disposition: A | Discharge status: Home / Self Care | Payer: Commercial Managed Care - HMO | Source: Home / Self Care | Attending: Emergency Medicine | Admitting: Emergency Medicine

## 2022-10-28 ENCOUNTER — Encounter (HOSPITAL_COMMUNITY): Payer: Self-pay

## 2022-10-28 ENCOUNTER — Emergency Department (HOSPITAL_COMMUNITY): Payer: Commercial Managed Care - HMO

## 2022-10-28 DIAGNOSIS — Z79899 Other long term (current) drug therapy: Secondary | ICD-10-CM | POA: Insufficient documentation

## 2022-10-28 DIAGNOSIS — J441 Chronic obstructive pulmonary disease with (acute) exacerbation: Secondary | ICD-10-CM | POA: Insufficient documentation

## 2022-10-28 DIAGNOSIS — F17200 Nicotine dependence, unspecified, uncomplicated: Secondary | ICD-10-CM | POA: Insufficient documentation

## 2022-10-28 DIAGNOSIS — Z20822 Contact with and (suspected) exposure to covid-19: Secondary | ICD-10-CM | POA: Insufficient documentation

## 2022-10-28 DIAGNOSIS — J439 Emphysema, unspecified: Secondary | ICD-10-CM | POA: Diagnosis not present

## 2022-10-28 DIAGNOSIS — I10 Essential (primary) hypertension: Secondary | ICD-10-CM | POA: Insufficient documentation

## 2022-10-28 LAB — CBC WITH DIFFERENTIAL/PLATELET
Abs Immature Granulocytes: 0.07 10*3/uL (ref 0.00–0.07)
Basophils Absolute: 0.1 10*3/uL (ref 0.0–0.1)
Basophils Relative: 0 %
Eosinophils Absolute: 1.6 10*3/uL — ABNORMAL HIGH (ref 0.0–0.5)
Eosinophils Relative: 10 %
HCT: 42.3 % (ref 39.0–52.0)
Hemoglobin: 14.4 g/dL (ref 13.0–17.0)
Immature Granulocytes: 1 %
Lymphocytes Relative: 11 %
Lymphs Abs: 1.7 10*3/uL (ref 0.7–4.0)
MCH: 28.6 pg (ref 26.0–34.0)
MCHC: 34 g/dL (ref 30.0–36.0)
MCV: 83.9 fL (ref 80.0–100.0)
Monocytes Absolute: 0.9 10*3/uL (ref 0.1–1.0)
Monocytes Relative: 6 %
Neutro Abs: 11.1 10*3/uL — ABNORMAL HIGH (ref 1.7–7.7)
Neutrophils Relative %: 72 %
Platelets: 311 10*3/uL (ref 150–400)
RBC: 5.04 MIL/uL (ref 4.22–5.81)
RDW: 13.6 % (ref 11.5–15.5)
WBC: 15.4 10*3/uL — ABNORMAL HIGH (ref 4.0–10.5)
nRBC: 0 % (ref 0.0–0.2)

## 2022-10-28 LAB — BLOOD GAS, VENOUS
Acid-Base Excess: 9 mmol/L — ABNORMAL HIGH (ref 0.0–2.0)
Bicarbonate: 35.2 mmol/L — ABNORMAL HIGH (ref 20.0–28.0)
O2 Saturation: 89.1 %
Patient temperature: 37
pCO2, Ven: 53 mmHg (ref 44–60)
pH, Ven: 7.43 (ref 7.25–7.43)
pO2, Ven: 61 mmHg — ABNORMAL HIGH (ref 32–45)

## 2022-10-28 LAB — COMPREHENSIVE METABOLIC PANEL
ALT: 40 U/L (ref 0–44)
AST: 25 U/L (ref 15–41)
Albumin: 3.4 g/dL — ABNORMAL LOW (ref 3.5–5.0)
Alkaline Phosphatase: 40 U/L (ref 38–126)
Anion gap: 7 (ref 5–15)
BUN: 18 mg/dL (ref 6–20)
CO2: 30 mmol/L (ref 22–32)
Calcium: 8.4 mg/dL — ABNORMAL LOW (ref 8.9–10.3)
Chloride: 101 mmol/L (ref 98–111)
Creatinine, Ser: 0.88 mg/dL (ref 0.61–1.24)
GFR, Estimated: >60 mL/min (ref >60)
Glucose, Bld: 112 mg/dL — ABNORMAL HIGH (ref 70–99)
Potassium: 3.8 mmol/L (ref 3.5–5.1)
Sodium: 138 mmol/L (ref 135–145)
Total Bilirubin: 0.5 mg/dL (ref 0.3–1.2)
Total Protein: 6.3 g/dL — ABNORMAL LOW (ref 6.5–8.1)

## 2022-10-28 LAB — RESP PANEL BY RT-PCR (FLU A&B, COVID) ARPGX2
Influenza A by PCR: NEGATIVE
Influenza B by PCR: NEGATIVE
SARS Coronavirus 2 by RT PCR: NEGATIVE

## 2022-10-28 MED ORDER — SODIUM CHLORIDE 0.9 % IV BOLUS
1000.0000 mL | Freq: Once | INTRAVENOUS | Status: AC
  Administered 2022-10-28: 1000 mL via INTRAVENOUS

## 2022-10-28 MED ORDER — SODIUM CHLORIDE 0.9 % IV SOLN
500.0000 mg | Freq: Once | INTRAVENOUS | Status: AC
  Administered 2022-10-29: 500 mg via INTRAVENOUS
  Filled 2022-10-28: qty 5

## 2022-10-28 MED ORDER — IPRATROPIUM BROMIDE 0.02 % IN SOLN
0.5000 mg | Freq: Once | RESPIRATORY_TRACT | Status: AC
  Administered 2022-10-28: 0.5 mg via RESPIRATORY_TRACT
  Filled 2022-10-28: qty 2.5

## 2022-10-28 MED ORDER — IOHEXOL 350 MG/ML SOLN
75.0000 mL | Freq: Once | INTRAVENOUS | Status: AC | PRN
  Administered 2022-10-28: 75 mL via INTRAVENOUS

## 2022-10-28 MED ORDER — MAGNESIUM SULFATE 2 GM/50ML IV SOLN
2.0000 g | Freq: Once | INTRAVENOUS | Status: AC
  Administered 2022-10-28: 2 g via INTRAVENOUS
  Filled 2022-10-28: qty 50

## 2022-10-28 MED ORDER — ALBUTEROL SULFATE (2.5 MG/3ML) 0.083% IN NEBU
10.0000 mg/h | INHALATION_SOLUTION | Freq: Once | RESPIRATORY_TRACT | Status: AC
  Administered 2022-10-28: 10 mg/h via RESPIRATORY_TRACT
  Filled 2022-10-28: qty 12

## 2022-10-28 MED ORDER — SODIUM CHLORIDE 0.9 % IV SOLN
1.0000 g | Freq: Once | INTRAVENOUS | Status: AC
  Administered 2022-10-28: 1 g via INTRAVENOUS
  Filled 2022-10-28: qty 10

## 2022-10-28 NOTE — ED Notes (Signed)
Resp therapist attempted to give patient is breathing treatment he states he wants to eat his food and use the bathroom before she starts it.

## 2022-10-28 NOTE — ED Provider Notes (Signed)
[facility] Provider Note  CSN: [id number] Arrival date & time: 10/28/22 2115  Chief Complaint(s) Shortness of Breath  HPI Franklin Chavez is a 61 y.o. male recently diagnosed with COPD, recently admitted for COPD exacerbation presenting to the emergency department for shortness of breath.  Reports he was discharged 2 days ago, since then has had progressive shortness of breath.  He reports that he took the inhaler he was given on discharge but was not able to afford the inhaler that was prescribed for him.  He reports that he has been using that without improvement.  Denies any fevers or chills.  Reports cough with green sputum.  Reports chest pain with coughing.  Symptoms are moderate.  His symptoms were worsening so he called EMS.  EMS gave patient a DuoNeb, 125 mg Solu-Medrol.  Per EMS he was borderline hypoxic on arrival and improved but patient reports he feels no different.  Past Medical History Past Medical History:  Diagnosis Date   Poor dentition 12/13/2018   Tobacco use disorder 12/13/2018   Patient Active Problem List   Diagnosis Date Noted   Arthritis 10/25/2022   COPD exacerbation (HCC) 10/24/2022   COPD with acute exacerbation (HCC) 10/23/2022   Hyperglycemia 10/23/2022   Hypokalemia 10/23/2022   Hypertension 10/23/2022   Tobacco use disorder 12/13/2018   Poor dentition 12/13/2018   Foreign body of left middle finger with infection 12/10/2018   Home Medication(s) Prior to Admission medications   Medication Sig Start Date End Date Taking? Authorizing Provider  albuterol (VENTOLIN HFA) 108 (90 Base) MCG/ACT inhaler Inhale 1-2 puffs into the lungs every 6 (six) hours as needed for wheezing or shortness of breath. 10/26/22   Arrien, York RamMauricio Daniel, MD  amLODipine (NORVASC) 10 MG tablet Take 1 tablet (10 mg total) by mouth daily. 10/27/22 11/26/22  Arrien, York RamMauricio Daniel, MD                                                                                                                                     Past Surgical History History reviewed. No pertinent surgical history. Family History Family History  Problem Relation Age of Onset   Diabetes Mellitus II Mother    Hypertension Mother     Social History Social History   Tobacco Use   Smoking status: Every Day   Smokeless tobacco: Never  Substance Use Topics   Alcohol use: No   Drug use: No   Allergies Patient has no known allergies.  Review of Systems Review of Systems  All other systems reviewed and are negative.   Physical Exam Vital Signs  I have reviewed the triage vital signs BP (!) 152/76   Pulse 75   Temp 98 F (36.7 C) (Oral)   Resp 14   Ht 6' (1.829 m)   Wt 77 kg   SpO2 99%   BMI 23.02 kg/m  Physical Exam Vitals and  nursing note reviewed.  Constitutional:      General: He is not in acute distress.    Appearance: Normal appearance.  HENT:     Mouth/Throat:     Mouth: Mucous membranes are moist.  Eyes:     Conjunctiva/sclera: Conjunctivae normal.  Cardiovascular:     Rate and Rhythm: Normal rate and regular rhythm.  Pulmonary:     Comments: Mild respiratory distress with tachypnea, increased work of breathing, diffuse wheezing, accessory muscle use Abdominal:     General: Abdomen is flat.     Palpations: Abdomen is soft.     Tenderness: There is no abdominal tenderness.  Musculoskeletal:     Right lower leg: No edema.     Left lower leg: No edema.  Skin:    General: Skin is warm and dry.     Capillary Refill: Capillary refill takes less than 2 seconds.  Neurological:     Mental Status: He is alert and oriented to person, place, and time. Mental status is at baseline.  Psychiatric:        Mood and Affect: Mood normal.        Behavior: Behavior normal.     ED Results and Treatments Labs (all labs ordered are listed, but only abnormal results are displayed) Labs Reviewed  COMPREHENSIVE METABOLIC PANEL - Abnormal;  Notable for the following components:      Result Value   Glucose, Bld 112 (*)    Calcium 8.4 (*)    Total Protein 6.3 (*)    Albumin 3.4 (*)    All other components within normal limits  CBC WITH DIFFERENTIAL/PLATELET - Abnormal; Notable for the following components:   WBC 15.4 (*)    Neutro Abs 11.1 (*)    Eosinophils Absolute 1.6 (*)    All other components within normal limits  BLOOD GAS, VENOUS - Abnormal; Notable for the following components:   pO2, Ven 61 (*)    Bicarbonate 35.2 (*)    Acid-Base Excess 9.0 (*)    All other components within normal limits  RESP PANEL BY RT-PCR (FLU A&B, COVID) ARPGX2                                                                                                                          Radiology DG Chest Port 1 View  Result Date: 10/28/2022 CLINICAL DATA:  Shortness of breath for 2 days, initial encounter EXAM: PORTABLE CHEST 1 VIEW COMPARISON:  10/23/2022 FINDINGS: Cardiac shadow is within normal limits. The lungs are hyperinflated. No focal infiltrate or effusion is seen. Extrinsic artifact is noted over the upper chest. No bony abnormality is noted. IMPRESSION: No active disease. Electronically Signed   By: Alcide Clever M.D.   On: 10/28/2022 22:18    Pertinent labs & imaging results that were available during my care of the patient were reviewed by me and considered in my medical decision making (see MDM for details).  Medications Ordered in ED Medications  magnesium sulfate IVPB 2 g 50 mL (2 g Intravenous New Bag/Given 10/28/22 2219)  albuterol (PROVENTIL) (2.5 MG/3ML) 0.083% nebulizer solution (has no administration in time range)  cefTRIAXone (ROCEPHIN) 1 g in sodium chloride 0.9 % 100 mL IVPB (has no administration in time range)  azithromycin (ZITHROMAX) 500 mg in sodium chloride 0.9 % 250 mL IVPB (has no administration in time range)  sodium chloride 0.9 % bolus 1,000 mL (1,000 mLs Intravenous New Bag/Given 10/28/22 2218)   ipratropium (ATROVENT) nebulizer solution 0.5 mg (0.5 mg Nebulization Given 10/28/22 2218)                                                                                                                                     Procedures .1-3 Lead EKG Interpretation  Performed by: Lonell Grandchild, MD Authorized by: Lonell Grandchild, MD     Interpretation: normal     ECG rate:  80   ECG rate assessment: normal     Rhythm: sinus rhythm     Ectopy: none     Conduction: normal     (including critical care time)  Medical Decision Making / ED Course   MDM:  61 yo Male presenting to the emergency department shortness of breath.  Suspect ongoing COPD exacerbation.  Chest x-ray without focal infiltrate.  Will treat with antibiotics given COPD exacerbation with increased sputum production.  Will give 1 hour continuous albuterol neb.  Will treat with magnesium.  Patient already received steroids.  Patient was recently hospitalized, endorses having some chest pain, and is tachypneic, lower concern for PE but will obtain CTA chest which would also evaluate for occult pneumonia.  Patient does not appear volume overloaded or have signs of CHF.  Chest x-ray without evidence of pneumothorax.  Will reassess.  Patient may require rehospitalization.  Clinical Course as of 10/28/22 2300  Sat Oct 28, 2022  2257 Signed out to oncoming provider pending CTA chest and re-evaluation, decision regarding hospitalization. [WS]    Clinical Course User Index [WS] Lonell Grandchild, MD     Additional history obtained: -Additional history obtained from ems -External records from outside source obtained and reviewed including: Chart review including previous notes, labs, imaging, consultation notes including D/C summary from hospitalization 2 days ago   Lab Tests: -I ordered, reviewed, and interpreted labs.   The pertinent results include:   Labs Reviewed  COMPREHENSIVE METABOLIC PANEL - Abnormal;  Notable for the following components:      Result Value   Glucose, Bld 112 (*)    Calcium 8.4 (*)    Total Protein 6.3 (*)    Albumin 3.4 (*)    All other components within normal limits  CBC WITH DIFFERENTIAL/PLATELET - Abnormal; Notable for the following components:   WBC 15.4 (*)    Neutro Abs 11.1 (*)    Eosinophils Absolute 1.6 (*)    All other components within normal  limits  BLOOD GAS, VENOUS - Abnormal; Notable for the following components:   pO2, Ven 61 (*)    Bicarbonate 35.2 (*)    Acid-Base Excess 9.0 (*)    All other components within normal limits  RESP PANEL BY RT-PCR (FLU A&B, COVID) ARPGX2    Notable for leukocytosis with neutrophilic predominance, elevated HCO2  EKG   EKG Interpretation  Date/Time:  Saturday October 28 2022 21:25:09 EST Ventricular Rate:  78 PR Interval:    QRS Duration: 106 QT Interval:  415 QTC Calculation: 473 R Axis:   87 Text Interpretation: Normal sinus rhythm Borderline right axis deviation Left ventricular hypertrophy No significant change since last tracing Reconfirmed by Alvino Blood ([id number]) on 10/28/2022 10:59:27 PM         Imaging Studies ordered: I ordered imaging studies including CXR On my interpretation imaging demonstrates no focal infiltrate I independently visualized and interpreted imaging. I agree with the radiologist interpretation   Medicines ordered and prescription drug management: Meds ordered this encounter  Medications   sodium chloride 0.9 % bolus 1,000 mL   magnesium sulfate IVPB 2 g 50 mL   ipratropium (ATROVENT) nebulizer solution 0.5 mg   albuterol (PROVENTIL) (2.5 MG/3ML) 0.083% nebulizer solution   cefTRIAXone (ROCEPHIN) 1 g in sodium chloride 0.9 % 100 mL IVPB    Order Specific Question:   Antibiotic Indication:    Answer:   Other Indication (list below)    Order Specific Question:   Other Indication:    Answer:   COPD exacerbation   azithromycin (ZITHROMAX) 500 mg in sodium chloride  0.9 % 250 mL IVPB    -I have reviewed the patients home medicines and have made adjustments as needed   Cardiac Monitoring: The patient was maintained on a cardiac monitor.  I personally viewed and interpreted the cardiac monitored which showed an underlying rhythm of: NSR  Social Determinants of Health:  Diagnosis or treatment significantly limited by social determinants of health: current smoker   Reevaluation: After the interventions noted above, I reevaluated the patient and found that they have improved  Co morbidities that complicate the patient evaluation  Past Medical History:  Diagnosis Date   Poor dentition 12/13/2018   Tobacco use disorder 12/13/2018      Dispostion: Disposition decision including need for hospitalization was considered, and patient disposition pending at time of sign out.     Final Clinical Impression(s) / ED Diagnoses Final diagnoses:  COPD exacerbation (HCC)     This chart was dictated using voice recognition software.  Despite best efforts to proofread,  errors can occur which can change the documentation meaning.    Lonell Grandchild, MD 10/28/22 2300

## 2022-10-28 NOTE — ED Triage Notes (Signed)
Patient has been having SOB for two days, has been using at home inhaler he was given from the hospital a couple of days ago without any relief. Patient was prescribe albuterol and prednisone at discharge but he was unable to fill the medications due to financial strain. Per EMS the patient has mild wheezing in the upper lobes, was also given duoneb, with 5 of albuterol, and 1.5 of solumedrol.

## 2022-10-29 LAB — TROPONIN I (HIGH SENSITIVITY): Troponin I (High Sensitivity): 10 ng/L (ref <18)

## 2022-10-29 MED ORDER — ALBUTEROL SULFATE HFA 108 (90 BASE) MCG/ACT IN AERS: 1.0000 | INHALATION_SPRAY | Freq: Four times a day (QID) | RESPIRATORY_TRACT | 0 refills | Status: DC | PRN

## 2022-10-29 MED ORDER — PREDNISONE 20 MG PO TABS: ORAL_TABLET | ORAL | 0 refills | Status: DC

## 2022-10-30 ENCOUNTER — Other Ambulatory Visit: Payer: Self-pay

## 2022-10-30 ENCOUNTER — Inpatient Hospital Stay (HOSPITAL_COMMUNITY)
Admission: EM | Admit: 2022-10-30 | Discharge: 2022-11-07 | DRG: 190 | Disposition: A | Discharge status: Home / Self Care | Payer: Commercial Managed Care - HMO | Attending: Internal Medicine | Admitting: Internal Medicine

## 2022-10-30 ENCOUNTER — Encounter (HOSPITAL_COMMUNITY): Payer: Self-pay

## 2022-10-30 ENCOUNTER — Emergency Department (HOSPITAL_COMMUNITY): Payer: Commercial Managed Care - HMO

## 2022-10-30 DIAGNOSIS — F17200 Nicotine dependence, unspecified, uncomplicated: Secondary | ICD-10-CM | POA: Diagnosis not present

## 2022-10-30 DIAGNOSIS — F1290 Cannabis use, unspecified, uncomplicated: Secondary | ICD-10-CM | POA: Diagnosis present

## 2022-10-30 DIAGNOSIS — Z560 Unemployment, unspecified: Secondary | ICD-10-CM

## 2022-10-30 DIAGNOSIS — F064 Anxiety disorder due to known physiological condition: Secondary | ICD-10-CM | POA: Diagnosis present

## 2022-10-30 DIAGNOSIS — J441 Chronic obstructive pulmonary disease with (acute) exacerbation: Principal | ICD-10-CM

## 2022-10-30 DIAGNOSIS — Z833 Family history of diabetes mellitus: Secondary | ICD-10-CM

## 2022-10-30 DIAGNOSIS — I10 Essential (primary) hypertension: Secondary | ICD-10-CM | POA: Diagnosis not present

## 2022-10-30 DIAGNOSIS — Z5900 Homelessness unspecified: Secondary | ICD-10-CM

## 2022-10-30 DIAGNOSIS — Z91148 Patient's other noncompliance with medication regimen for other reason: Secondary | ICD-10-CM

## 2022-10-30 DIAGNOSIS — Z6823 Body mass index (BMI) 23.0-23.9, adult: Secondary | ICD-10-CM

## 2022-10-30 DIAGNOSIS — J101 Influenza due to other identified influenza virus with other respiratory manifestations: Secondary | ICD-10-CM | POA: Diagnosis present

## 2022-10-30 DIAGNOSIS — F17210 Nicotine dependence, cigarettes, uncomplicated: Secondary | ICD-10-CM | POA: Diagnosis present

## 2022-10-30 DIAGNOSIS — Z720 Tobacco use: Secondary | ICD-10-CM | POA: Diagnosis present

## 2022-10-30 DIAGNOSIS — Z8249 Family history of ischemic heart disease and other diseases of the circulatory system: Secondary | ICD-10-CM

## 2022-10-30 DIAGNOSIS — E440 Moderate protein-calorie malnutrition: Secondary | ICD-10-CM | POA: Diagnosis present

## 2022-10-30 DIAGNOSIS — J439 Emphysema, unspecified: Principal | ICD-10-CM | POA: Diagnosis present

## 2022-10-30 DIAGNOSIS — Z79899 Other long term (current) drug therapy: Secondary | ICD-10-CM

## 2022-10-30 DIAGNOSIS — Z1152 Encounter for screening for COVID-19: Secondary | ICD-10-CM

## 2022-10-30 DIAGNOSIS — J9601 Acute respiratory failure with hypoxia: Secondary | ICD-10-CM | POA: Diagnosis present

## 2022-10-30 LAB — CBC
HCT: 46 % (ref 39.0–52.0)
Hemoglobin: 15.1 g/dL (ref 13.0–17.0)
MCH: 28.3 pg (ref 26.0–34.0)
MCHC: 32.8 g/dL (ref 30.0–36.0)
MCV: 86.1 fL (ref 80.0–100.0)
Platelets: 304 10*3/uL (ref 150–400)
RBC: 5.34 MIL/uL (ref 4.22–5.81)
RDW: 13.7 % (ref 11.5–15.5)
WBC: 14.5 10*3/uL — ABNORMAL HIGH (ref 4.0–10.5)
nRBC: 0 % (ref 0.0–0.2)

## 2022-10-30 LAB — BASIC METABOLIC PANEL
Anion gap: 6 (ref 5–15)
BUN: 19 mg/dL (ref 6–20)
CO2: 32 mmol/L (ref 22–32)
Calcium: 8.5 mg/dL — ABNORMAL LOW (ref 8.9–10.3)
Chloride: 102 mmol/L (ref 98–111)
Creatinine, Ser: 0.69 mg/dL (ref 0.61–1.24)
GFR, Estimated: >60 mL/min (ref >60)
Glucose, Bld: 107 mg/dL — ABNORMAL HIGH (ref 70–99)
Potassium: 4.1 mmol/L (ref 3.5–5.1)
Sodium: 140 mmol/L (ref 135–145)

## 2022-10-30 LAB — RESP PANEL BY RT-PCR (FLU A&B, COVID) ARPGX2
Influenza A by PCR: NEGATIVE
Influenza B by PCR: NEGATIVE
SARS Coronavirus 2 by RT PCR: NEGATIVE

## 2022-10-30 LAB — TROPONIN I (HIGH SENSITIVITY): Troponin I (High Sensitivity): 14 ng/L (ref <18)

## 2022-10-30 MED ORDER — ALBUTEROL SULFATE HFA 108 (90 BASE) MCG/ACT IN AERS
4.0000 | INHALATION_SPRAY | Freq: Once | RESPIRATORY_TRACT | Status: AC
  Administered 2022-10-30: 4 via RESPIRATORY_TRACT
  Filled 2022-10-30: qty 6.7

## 2022-10-30 MED ORDER — MAGNESIUM SULFATE 2 GM/50ML IV SOLN
2.0000 g | Freq: Once | INTRAVENOUS | Status: AC
  Administered 2022-10-30: 2 g via INTRAVENOUS
  Filled 2022-10-30: qty 50

## 2022-10-30 MED ORDER — UMECLIDINIUM-VILANTEROL 62.5-25 MCG/ACT IN AEPB
1.0000 | INHALATION_SPRAY | Freq: Every day | RESPIRATORY_TRACT | Status: DC
  Administered 2022-10-31: 1 via RESPIRATORY_TRACT
  Filled 2022-10-30: qty 14

## 2022-10-30 MED ORDER — SODIUM CHLORIDE 0.9 % IV SOLN
1.0000 g | INTRAVENOUS | Status: AC
  Administered 2022-10-30 – 2022-11-03 (×5): 1 g via INTRAVENOUS
  Filled 2022-10-30 (×5): qty 10

## 2022-10-30 MED ORDER — ALBUTEROL SULFATE (2.5 MG/3ML) 0.083% IN NEBU: 5.0000 mg | INHALATION_SOLUTION | Freq: Once | RESPIRATORY_TRACT | Status: DC

## 2022-10-30 MED ORDER — ENOXAPARIN SODIUM 40 MG/0.4ML IJ SOSY
40.0000 mg | PREFILLED_SYRINGE | INTRAMUSCULAR | Status: DC
  Administered 2022-10-30 – 2022-11-05 (×6): 40 mg via SUBCUTANEOUS
  Filled 2022-10-30 (×6): qty 0.4

## 2022-10-30 MED ORDER — HYDRALAZINE HCL 25 MG PO TABS
25.0000 mg | ORAL_TABLET | Freq: Four times a day (QID) | ORAL | Status: DC | PRN
  Administered 2022-10-30 – 2022-10-31 (×3): 25 mg via ORAL
  Filled 2022-10-30 (×3): qty 1

## 2022-10-30 MED ORDER — ALBUTEROL (5 MG/ML) CONTINUOUS INHALATION SOLN: 10.0000 mg/h | INHALATION_SOLUTION | RESPIRATORY_TRACT | Status: DC

## 2022-10-30 MED ORDER — METHYLPREDNISOLONE SODIUM SUCC 125 MG IJ SOLR
125.0000 mg | INTRAMUSCULAR | Status: AC
  Administered 2022-10-30: 125 mg via INTRAVENOUS
  Filled 2022-10-30: qty 2

## 2022-10-30 MED ORDER — AEROCHAMBER PLUS FLO-VU MEDIUM MISC: 1.0000 | Freq: Once | Status: DC

## 2022-10-30 MED ORDER — AMLODIPINE BESYLATE 10 MG PO TABS
10.0000 mg | ORAL_TABLET | Freq: Every day | ORAL | Status: DC
  Administered 2022-10-30 – 2022-11-07 (×9): 10 mg via ORAL
  Filled 2022-10-30 (×2): qty 1
  Filled 2022-10-30 (×2): qty 2
  Filled 2022-10-30 (×5): qty 1

## 2022-10-30 MED ORDER — IPRATROPIUM-ALBUTEROL 0.5-2.5 (3) MG/3ML IN SOLN
3.0000 mL | RESPIRATORY_TRACT | Status: AC
  Administered 2022-10-30 (×3): 3 mL via RESPIRATORY_TRACT
  Filled 2022-10-30: qty 9

## 2022-10-30 MED ORDER — IPRATROPIUM-ALBUTEROL 0.5-2.5 (3) MG/3ML IN SOLN
3.0000 mL | Freq: Once | RESPIRATORY_TRACT | Status: DC
  Filled 2022-10-30: qty 3

## 2022-10-30 MED ORDER — IPRATROPIUM-ALBUTEROL 0.5-2.5 (3) MG/3ML IN SOLN
3.0000 mL | RESPIRATORY_TRACT | Status: DC | PRN
  Administered 2022-10-30 – 2022-11-05 (×9): 3 mL via RESPIRATORY_TRACT
  Filled 2022-10-30 (×7): qty 3
  Filled 2022-10-30: qty 12
  Filled 2022-10-30 (×2): qty 3

## 2022-10-30 MED ORDER — AEROCHAMBER Z-STAT PLUS/MEDIUM MISC
1.0000 | Freq: Once | Status: AC
  Administered 2022-10-30: 1
  Filled 2022-10-30: qty 1

## 2022-10-30 MED ORDER — PREDNISONE 20 MG PO TABS: 40.0000 mg | ORAL_TABLET | Freq: Every day | ORAL | Status: DC

## 2022-10-30 MED ORDER — METHYLPREDNISOLONE SODIUM SUCC 40 MG IJ SOLR
40.0000 mg | Freq: Two times a day (BID) | INTRAMUSCULAR | Status: DC
  Administered 2022-10-31: 40 mg via INTRAVENOUS
  Filled 2022-10-30: qty 1

## 2022-10-30 NOTE — ED Provider Notes (Signed)
[facility]-EMERGENCY DEPT Provider Note   CSN: [id number] Arrival date & time: 10/30/22  1115     History  Chief Complaint  Patient presents with   Asthma    Franklin Chavez is a 61 y.o. male.  61 yo M with chief complaints of difficulty breathing.  This has been has been going on since Thanksgiving.  Has had increased cough difficulty breathing chest tightness is having chills off and on.   Asthma       Home Medications Prior to Admission medications   Medication Sig Start Date End Date Taking? Authorizing Provider  albuterol (VENTOLIN HFA) 108 (90 Base) MCG/ACT inhaler Inhale 1-2 puffs into the lungs every 6 (six) hours as needed for wheezing or shortness of breath. 10/26/22   Arrien, York Ram, MD  albuterol (VENTOLIN HFA) 108 (90 Base) MCG/ACT inhaler Inhale 1-2 puffs into the lungs every 6 (six) hours as needed for wheezing or shortness of breath. 10/29/22   Palumbo, April, MD  amLODipine (NORVASC) 10 MG tablet Take 1 tablet (10 mg total) by mouth daily. 10/27/22 11/26/22  Arrien, York Ram, MD  predniSONE (DELTASONE) 20 MG tablet 2 tabs po daily x 4 days 10/29/22   Nicanor Alcon, April, MD      Allergies    Patient has no known allergies.    Review of Systems   Review of Systems  Physical Exam Updated Vital Signs BP (!) 191/104 (BP Location: Left Arm)   Pulse 83   Temp 99.8 F (37.7 C) (Oral)   Resp 20   SpO2 100%  Physical Exam Vitals and nursing note reviewed.  Constitutional:      Appearance: He is well-developed.  HENT:     Head: Normocephalic and atraumatic.  Eyes:     Pupils: Pupils are equal, round, and reactive to light.  Neck:     Vascular: No JVD.  Cardiovascular:     Rate and Rhythm: Normal rate and regular rhythm.     Heart sounds: No murmur heard.    No friction rub. No gallop.  Pulmonary:     Effort: No respiratory distress.     Breath sounds: No wheezing.     Comments: Diminished breath sounds in all  fields with prolonged expiratory effort Abdominal:     General: There is no distension.     Tenderness: There is no abdominal tenderness. There is no guarding or rebound.  Musculoskeletal:        General: Normal range of motion.     Cervical back: Normal range of motion and neck supple.  Skin:    Coloration: Skin is not pale.     Findings: No rash.  Neurological:     Mental Status: He is alert and oriented to person, place, and time.  Psychiatric:        Behavior: Behavior normal.     ED Results / Procedures / Treatments   Labs (all labs ordered are listed, but only abnormal results are displayed) Labs Reviewed  BASIC METABOLIC PANEL - Abnormal; Notable for the following components:      Result Value   Glucose, Bld 107 (*)    Calcium 8.5 (*)    All other components within normal limits  CBC - Abnormal; Notable for the following components:   WBC 14.5 (*)    All other components within normal limits  RESP PANEL BY RT-PCR (FLU A&B, COVID) ARPGX2  TROPONIN I (HIGH SENSITIVITY)    EKG EKG Interpretation  Date/Time:  Monday October 30 2022 13:52:53 EST Ventricular Rate:  89 PR Interval:  118 QRS Duration: 84 QT Interval:  372 QTC Calculation: 452 R Axis:   89 Text Interpretation: Normal sinus rhythm Moderate voltage criteria for LVH, may be normal variant ( Sokolow-Lyon , Cornell product ) Borderline ECG No significant change since last tracing Confirmed by Melene Plan [phone]) on 10/30/2022 2:13:52 PM  Radiology DG Chest 2 View  Result Date: 10/30/2022 CLINICAL DATA:  Cough.  Asthma. EXAM: CHEST - 2 VIEW COMPARISON:  AP chest 10/28/2022 and 10/23/2022; CT chest 10/28/2022 FINDINGS: Cardiac silhouette and mediastinal contours are within normal limits. There is again flattening of the diaphragms and moderate hyperinflation. The lungs are clear. No pleural effusion or pneumothorax. Mild multilevel degenerative disc and endplate changes of the thoracic spine. IMPRESSION:  Chronic moderate hyperinflation compatible with the COPD seen on prior CT. No acute lung process. Electronically Signed   By: Neita Garnet M.D.   On: 10/30/2022 12:01   CT Angio Chest PE W/Cm &/Or Wo Cm  Result Date: 10/29/2022 CLINICAL DATA:  Pulmonary embolism suspected, high probability. Dyspnea. EXAM: CT ANGIOGRAPHY CHEST WITH CONTRAST TECHNIQUE: Multidetector CT imaging of the chest was performed using the standard protocol during bolus administration of intravenous contrast. Multiplanar CT image reconstructions and MIPs were obtained to evaluate the vascular anatomy. RADIATION DOSE REDUCTION: This exam was performed according to the departmental dose-optimization program which includes automated exposure control, adjustment of the mA and/or kV according to patient size and/or use of iterative reconstruction technique. CONTRAST:  12mL OMNIPAQUE IOHEXOL 350 MG/ML SOLN COMPARISON:  None Available. FINDINGS: Cardiovascular: Heart is normal in size and there is no pericardial effusion. Scattered coronary artery calcifications are noted. There is mild atherosclerotic calcification of the aorta without evidence of aneurysm. The pulmonary trunk is mildly distended which may be associated with underlying pulmonary artery hypertension. No definite evidence of pulmonary embolism. Examination is limited due to respiratory motion artifact. Mediastinum/Nodes: No enlarged mediastinal, hilar, or axillary lymph nodes. Thyroid gland, trachea, and esophagus demonstrate no significant findings. Lungs/Pleura: Advanced paraseptal and centrilobular emphysematous changes are present in the lungs. No consolidation, effusion, or pneumothorax. Upper Abdomen: No acute abnormality. Musculoskeletal: Degenerative changes are present in the thoracic spine. No acute or suspicious osseous abnormality. Review of the MIP images confirms the above findings. IMPRESSION: 1. No evidence of pulmonary embolism or other acute thoracic process. 2.  Advanced emphysema. 3. Aortic atherosclerosis and coronary artery calcifications. Electronically Signed   By: Thornell Sartorius M.D.   On: 10/29/2022 00:33   DG Chest Port 1 View  Result Date: 10/28/2022 CLINICAL DATA:  Shortness of breath for 2 days, initial encounter EXAM: PORTABLE CHEST 1 VIEW COMPARISON:  10/23/2022 FINDINGS: Cardiac shadow is within normal limits. The lungs are hyperinflated. No focal infiltrate or effusion is seen. Extrinsic artifact is noted over the upper chest. No bony abnormality is noted. IMPRESSION: No active disease. Electronically Signed   By: Alcide Clever M.D.   On: 10/28/2022 22:18    Procedures .Critical Care  Performed by: Melene Plan, DO Authorized by: Melene Plan, DO   Critical care provider statement:    Critical care time (minutes):  35   Critical care time was exclusive of:  Separately billable procedures and treating other patients   Critical care was time spent personally by me on the following activities:  Development of treatment plan with patient or surrogate, discussions with consultants, evaluation of patient's response to treatment, examination of patient, ordering and  review of laboratory studies, ordering and review of radiographic studies, ordering and performing treatments and interventions, pulse oximetry, re-evaluation of patient's condition and review of old charts   Care discussed with: admitting provider       Medications Ordered in ED Medications  albuterol (PROVENTIL) (2.5 MG/3ML) 0.083% nebulizer solution 5 mg (has no administration in time range)  hydrALAZINE (APRESOLINE) tablet 25 mg (has no administration in time range)  methylPREDNISolone sodium succinate (SOLU-MEDROL) 125 mg/2 mL injection 125 mg (125 mg Intravenous Given 10/30/22 1421)  albuterol (VENTOLIN HFA) 108 (90 Base) MCG/ACT inhaler 4 puff (4 puffs Inhalation Given 10/30/22 1251)  aerochamber Z-Stat Plus/medium 1 each (1 each Other Given 10/30/22 1421)   ipratropium-albuterol (DUONEB) 0.5-2.5 (3) MG/3ML nebulizer solution 3 mL (3 mLs Nebulization Given 10/30/22 1424)  magnesium sulfate IVPB 2 g 50 mL (2 g Intravenous New Bag/Given 10/30/22 1507)    ED Course/ Medical Decision Making/ A&P                           Medical Decision Making Amount and/or Complexity of Data Reviewed Labs: ordered. Radiology: ordered.  Risk Prescription drug management. Decision regarding hospitalization.   61 yo M with a chief complaints of difficulty breathing.  This has been going on for about 3 to 4 days now.  He has a history of asthma and thinks this feels similar.  Very tight on my lung exam.  Will give 3 DuoNebs back-to-back steroids and Solu-Medrol, give magnesium.  Chest x-ray independently interpreted by me without focal infiltrate.  Mild leukocytosis.  Reassess.  The patients results and plan were reviewed and discussed.   Any x-rays performed were independently reviewed by myself.   Differential diagnosis were considered with the presenting HPI.  Medications  albuterol (PROVENTIL) (2.5 MG/3ML) 0.083% nebulizer solution 5 mg (has no administration in time range)  hydrALAZINE (APRESOLINE) tablet 25 mg (has no administration in time range)  methylPREDNISolone sodium succinate (SOLU-MEDROL) 125 mg/2 mL injection 125 mg (125 mg Intravenous Given 10/30/22 1421)  albuterol (VENTOLIN HFA) 108 (90 Base) MCG/ACT inhaler 4 puff (4 puffs Inhalation Given 10/30/22 1251)  aerochamber Z-Stat Plus/medium 1 each (1 each Other Given 10/30/22 1421)  ipratropium-albuterol (DUONEB) 0.5-2.5 (3) MG/3ML nebulizer solution 3 mL (3 mLs Nebulization Given 10/30/22 1424)  magnesium sulfate IVPB 2 g 50 mL (2 g Intravenous New Bag/Given 10/30/22 1507)    Vitals:   10/30/22 1127 10/30/22 1346 10/30/22 1424 10/30/22 1445  BP: (!) 176/95 (!) 192/121  (!) 191/104  Pulse: 74 81  83  Resp: 18 (!) 22  20  Temp: 98.4 F (36.9 C) 99.8 F (37.7 C)    TempSrc: Oral Oral     SpO2: 96% 90% 100% 100%    Final diagnoses:  COPD with acute exacerbation (HCC)    Admission/ observation were discussed with the admitting physician, patient and/or family and they are comfortable with the plan.          Final Clinical Impression(s) / ED Diagnoses Final diagnoses:  COPD with acute exacerbation [facility])    Rx / DC Orders ED Discharge Orders     None         Melene Plan, DO 10/30/22 1558

## 2022-10-30 NOTE — ED Provider Triage Note (Signed)
Emergency Medicine Provider Triage Evaluation Note  Franklin Chavez , a 61 y.o. male  was evaluated in triage.  Pt complains of wheezing, cough SOB. States he's felt this way since just before thanksgiving (19th) Was seen recently for same w improvement after neb/steroids here for asthma/copd.  Smoker still smoking.   Endorses some chest tightness.    Review of Systems  Positive: SOB, chest tightness/cp Negative: Fever, NV  Physical Exam  BP (!) 176/95 (BP Location: Left Arm)   Pulse 74   Temp 98.4 F (36.9 C) (Oral)   Resp 18   SpO2 96%  Gen:   Awake, no distress   Resp:  Normal effort  MSK:   Moves extremities without difficulty  Other:  Faint end expiratory wheeze, IV in arm.    Medical Decision Making  Medically screening exam initiated at 12:27 PM.  Appropriate orders placed.  Franklin Chavez was informed that the remainder of the evaluation will be completed by another provider, this initial triage assessment does not replace that evaluation, and the importance of remaining in the ED until their evaluation is complete.  Speaking in full sentences -- reviewed emr seems has had severe wheezing/hypoxia in the past. Will order duoneb/albut/solumedrol, not hypoxic or ill appearing. Trop and EKG CXR added to workup.    Franklin Chavez, [place] 10/30/22 1229

## 2022-10-30 NOTE — ED Triage Notes (Signed)
Patient BIB [facility] from a store. Has asthma and said he has not gotten it under control since thanksgiving. Nonproductive cough. Wheezing in lower lobes.   EMS 5mg  albuterol 20G left AC 95% room air

## 2022-10-30 NOTE — H&P (Signed)
History and Physical    Patient: Franklin Chavez:[id number] DOB: [birth year] DOA: 10/30/2022 DOS: the patient was seen and examined on 10/30/2022 PCP: Patient, No Pcp Per  Patient coming from: Homeless  Chief Complaint:  Chief Complaint  Patient presents with   Asthma   HPI: Franklin Chavez is a 61 y.o. male with COPD, HTN, tobacco use disorder and recent hospitalization for COPD exacerbation returning with shortness of breath and chest tightness.  Patient was hospitalized 11/20-11/23 for COPD exacerbation and discharged on albuterol inhaler as needed.  He returned to ED 11/25.work-up including CTA chest was negative except for emphysema.  He was discharged on p.o. prednisone and albuterol inhaler.  He reports acute shortness of breath, chest tightness and wheezing since this morning.  He also reports runny nose and productive cough with whitish phlegm.  He reports using his albuterol inhaler without improvement.  Also reports taking his prednisone this morning.  He denies fever, GI or UTI symptoms.  He reports smoking 3 to 4 cigarettes since his last hospitalization.  Before that, he smoked about half a pack a day since he was 61 years of age.  He denies alcohol use or recreational drug use.  He prefers to remain full code.  In ED, hypertensive with diastolic as high as 120s and systolic as high as 190s.  Mild leukocytosis to 14.5.  Oxygen dropped to 90% on RA and he was donned on 4 L.  CXR with hyperinflation suggesting emphysema/COPD.  Troponin and EKG basically normal.  Patient was given IV Solu-Medrol, IV magnesium sulfate and 3 rounds of DuoNeb, and hospitalist service called for admission due to persistent chest tightness and work of breathing despite treatment.   Review of Systems: As mentioned in the history of present illness. All other systems reviewed and are negative. Past Medical History:  Diagnosis Date   Poor dentition 12/13/2018   Tobacco use disorder 12/13/2018    History reviewed. No pertinent surgical history. Social History:  reports that he has been smoking. He has never used smokeless tobacco. He reports that he does not drink alcohol and does not use drugs.  No Known Allergies  Family History  Problem Relation Age of Onset   Diabetes Mellitus II Mother    Hypertension Mother     Prior to Admission medications   Medication Sig Start Date End Date Taking? Authorizing Provider  albuterol (VENTOLIN HFA) 108 (90 Base) MCG/ACT inhaler Inhale 1-2 puffs into the lungs every 6 (six) hours as needed for wheezing or shortness of breath. 10/26/22   Arrien, York Ram, MD  albuterol (VENTOLIN HFA) 108 (90 Base) MCG/ACT inhaler Inhale 1-2 puffs into the lungs every 6 (six) hours as needed for wheezing or shortness of breath. 10/29/22   Palumbo, April, MD  amLODipine (NORVASC) 10 MG tablet Take 1 tablet (10 mg total) by mouth daily. 10/27/22 11/26/22  Arrien, York Ram, MD  predniSONE (DELTASONE) 20 MG tablet 2 tabs po daily x 4 days 10/29/22   Nicanor Alcon, April, MD    Physical Exam: Vitals:   10/30/22 1346 10/30/22 1424 10/30/22 1445 10/30/22 1604  BP: (!) 192/121  (!) 191/104 (!) 165/95  Pulse: 81  83 80  Resp: (!) 22  20 14   Temp: 99.8 F (37.7 C)     TempSrc: Oral     SpO2: 90% 100% 100% 92%   GENERAL: No apparent distress.  Nontoxic. HEENT: MMM.  Vision and hearing grossly intact.  NECK: Supple.  No apparent JVD.  RESP:  No IWOB.  Diminished aeration bilaterally.  No wheeze, crackles or rhonchi CVS:  RRR. Heart sounds normal.  ABD/GI/GU: BS+. Abd soft, NTND.  MSK/EXT:  Moves extremities. No apparent deformity. No edema.  SKIN: no apparent skin lesion or wound NEURO: Awake and alert. Oriented appropriately.  No apparent focal neuro deficit. PSYCH: Calm. Normal affect.  Data Reviewed: See HPI above  Assessment and Plan: Principal Problem:   COPD with acute exacerbation (HCC) Active Problems:   Tobacco use disorder    Uncontrolled hypertension   Homelessness   COPD without acute exacerbation: Presents with SOB, chest tightness, wheezing and productive cough with whitish phlegm.  CTA chest from yesterday without PE or pneumonia.  CXR with hyperinflation suggesting COPD/emphysema.  Troponin and EKG without significant finding.  He also have runny nose.  Exacerbation likely due to ongoing smoking and noncompliance with meds.  He is not on controller either. -Systemic steroid, antibiotics (CTX), Anoro Ellipta and as needed DuoNeb -Encouraged smoking cessation. -Patient needs control on discharge -TOC consulted for PCP and homelessness -Check COVID-19 and influenza PCR  Uncontrolled hypertension: SBP as high as 190s and DBP as high as 121 but improved. -Resume home amlodipine.  Recently discharged on this. -P.o. hydralazine as needed with parameters  Tobacco use disorder: About 20-pack-year history.  Reports cutting down to 3 to 4 cigarettes a day recent hospitalization. -Encouraged smoking cessation. -Declined nicotine patch.  Homelessness -TOC consulted  Leukocytosis: Likely demargination from steroid.   Advance Care Planning:   Code Status: Full Code   Consults: None  Family Communication: None at bedside  Severity of Illness: The appropriate patient status for this patient is OBSERVATION. Observation status is judged to be reasonable and necessary in order to provide the required intensity of service to ensure the patient's safety. The patient's presenting symptoms, physical exam findings, and initial radiographic and laboratory data in the context of their medical condition is felt to place them at decreased risk for further clinical deterioration. Furthermore, it is anticipated that the patient will be medically stable for discharge from the hospital within 2 midnights of admission.   Author: Almon Hercules, MD 10/30/2022 4:21 PM  For on call review [email or web address].

## 2022-10-30 NOTE — ED Notes (Signed)
Pt o2 sats ranging 88-90% room air in lobby. Triage RN aware and will do nasal cannula in meantime.

## 2022-10-31 ENCOUNTER — Encounter (HOSPITAL_COMMUNITY): Payer: Self-pay | Admitting: Student

## 2022-10-31 DIAGNOSIS — F17200 Nicotine dependence, unspecified, uncomplicated: Secondary | ICD-10-CM | POA: Diagnosis not present

## 2022-10-31 DIAGNOSIS — J441 Chronic obstructive pulmonary disease with (acute) exacerbation: Secondary | ICD-10-CM | POA: Diagnosis present

## 2022-10-31 DIAGNOSIS — J439 Emphysema, unspecified: Secondary | ICD-10-CM | POA: Diagnosis present

## 2022-10-31 DIAGNOSIS — Z560 Unemployment, unspecified: Secondary | ICD-10-CM | POA: Diagnosis not present

## 2022-10-31 DIAGNOSIS — E440 Moderate protein-calorie malnutrition: Secondary | ICD-10-CM | POA: Diagnosis present

## 2022-10-31 DIAGNOSIS — Z5900 Homelessness unspecified: Secondary | ICD-10-CM | POA: Diagnosis not present

## 2022-10-31 DIAGNOSIS — F17210 Nicotine dependence, cigarettes, uncomplicated: Secondary | ICD-10-CM | POA: Diagnosis present

## 2022-10-31 DIAGNOSIS — I272 Pulmonary hypertension, unspecified: Secondary | ICD-10-CM | POA: Diagnosis not present

## 2022-10-31 DIAGNOSIS — Z91148 Patient's other noncompliance with medication regimen for other reason: Secondary | ICD-10-CM | POA: Diagnosis not present

## 2022-10-31 DIAGNOSIS — F064 Anxiety disorder due to known physiological condition: Secondary | ICD-10-CM | POA: Diagnosis present

## 2022-10-31 DIAGNOSIS — Z6823 Body mass index (BMI) 23.0-23.9, adult: Secondary | ICD-10-CM | POA: Diagnosis not present

## 2022-10-31 DIAGNOSIS — J9601 Acute respiratory failure with hypoxia: Secondary | ICD-10-CM | POA: Diagnosis present

## 2022-10-31 DIAGNOSIS — F1290 Cannabis use, unspecified, uncomplicated: Secondary | ICD-10-CM | POA: Diagnosis present

## 2022-10-31 DIAGNOSIS — Z1152 Encounter for screening for COVID-19: Secondary | ICD-10-CM | POA: Diagnosis not present

## 2022-10-31 DIAGNOSIS — I10 Essential (primary) hypertension: Secondary | ICD-10-CM | POA: Diagnosis present

## 2022-10-31 DIAGNOSIS — Z8249 Family history of ischemic heart disease and other diseases of the circulatory system: Secondary | ICD-10-CM | POA: Diagnosis not present

## 2022-10-31 DIAGNOSIS — J101 Influenza due to other identified influenza virus with other respiratory manifestations: Secondary | ICD-10-CM | POA: Diagnosis present

## 2022-10-31 DIAGNOSIS — Z79899 Other long term (current) drug therapy: Secondary | ICD-10-CM | POA: Diagnosis not present

## 2022-10-31 DIAGNOSIS — Z833 Family history of diabetes mellitus: Secondary | ICD-10-CM | POA: Diagnosis not present

## 2022-10-31 LAB — HIV ANTIBODY (ROUTINE TESTING W REFLEX): HIV Screen 4th Generation wRfx: NONREACTIVE

## 2022-10-31 MED ORDER — IPRATROPIUM-ALBUTEROL 0.5-2.5 (3) MG/3ML IN SOLN
3.0000 mL | Freq: Four times a day (QID) | RESPIRATORY_TRACT | Status: DC
  Administered 2022-10-31 – 2022-11-03 (×13): 3 mL via RESPIRATORY_TRACT
  Filled 2022-10-31 (×13): qty 3

## 2022-10-31 MED ORDER — GUAIFENESIN-DM 100-10 MG/5ML PO SYRP
5.0000 mL | ORAL_SOLUTION | ORAL | Status: DC | PRN
  Administered 2022-10-31 – 2022-11-01 (×6): 5 mL via ORAL
  Filled 2022-10-31 (×2): qty 5
  Filled 2022-10-31: qty 10
  Filled 2022-10-31: qty 5
  Filled 2022-10-31: qty 10
  Filled 2022-10-31: qty 5

## 2022-10-31 MED ORDER — GUAIFENESIN ER 600 MG PO TB12
600.0000 mg | ORAL_TABLET | Freq: Two times a day (BID) | ORAL | Status: DC
  Administered 2022-10-31 – 2022-11-07 (×14): 600 mg via ORAL
  Filled 2022-10-31 (×14): qty 1

## 2022-10-31 MED ORDER — BENZONATATE 100 MG PO CAPS
100.0000 mg | ORAL_CAPSULE | Freq: Three times a day (TID) | ORAL | Status: DC | PRN
  Administered 2022-10-31: 100 mg via ORAL
  Filled 2022-10-31: qty 1

## 2022-10-31 MED ORDER — PREDNISONE 20 MG PO TABS: 40.0000 mg | ORAL_TABLET | Freq: Every day | ORAL | Status: DC

## 2022-10-31 MED ORDER — ISOSORBIDE MONONITRATE ER 30 MG PO TB24
30.0000 mg | ORAL_TABLET | Freq: Every day | ORAL | Status: DC
  Administered 2022-10-31 – 2022-11-03 (×4): 30 mg via ORAL
  Filled 2022-10-31 (×4): qty 1

## 2022-10-31 MED ORDER — METHYLPREDNISOLONE SODIUM SUCC 125 MG IJ SOLR: 80.0000 mg | Freq: Two times a day (BID) | INTRAMUSCULAR | Status: DC

## 2022-10-31 MED ORDER — METHYLPREDNISOLONE SODIUM SUCC 125 MG IJ SOLR
80.0000 mg | Freq: Two times a day (BID) | INTRAMUSCULAR | Status: AC
  Administered 2022-10-31 – 2022-11-01 (×3): 80 mg via INTRAVENOUS
  Filled 2022-10-31 (×3): qty 2

## 2022-10-31 NOTE — ED Notes (Signed)
SpO2 dropped to 91 when ambulating

## 2022-10-31 NOTE — Progress Notes (Signed)
PT Cancellation Note  Patient Details Name: Franklin Chavez MRN: [id number] DOB: Dec 10, 1960   Cancelled Treatment:    Reason Eval/Treat Not Completed: PT screened, no needs identified, will sign off, up ad lib.  Blanchard Kelch PT Acute Rehabilitation Services Office [phone] Weekend pager-[phone]. Rada Hay 10/31/2022, 12:24 PM

## 2022-10-31 NOTE — ED Notes (Signed)
Pt has been repeatedly removing vital sign monitoring devices, after instructed to stay in room and keep devices on, pt continues to walk outside room, monitoring devices removed.

## 2022-10-31 NOTE — Progress Notes (Signed)
OT Cancellation Note  Patient Details Name: Franklin Chavez MRN: [id number] DOB: [birth year]   Cancelled Treatment:    Reason Eval/Treat Not Completed: OT screened, no needs identified, will sign off Patient is up walking around in room in ED with nursing reporting patient has been taking himself to the bathroom. No skilled OT needs identified. OT signing off at this time Rosalio Loud, [place] Acute Rehabilitation Department Office# [phone]  10/31/2022, 9:58 AM

## 2022-10-31 NOTE — ED Notes (Signed)
Pt expressed he felt "a little bit better" following duo-neb application.

## 2022-10-31 NOTE — Progress Notes (Signed)
PROGRESS NOTE  Franklin Chavez YKD:[id number] DOB: [birth year]   PCP: Patient, No Pcp Per  Patient is from: Homeless  DOA: 10/30/2022 LOS: 0  Chief complaints Chief Complaint  Patient presents with   Asthma     Brief Narrative / Interim history: 61 y.o. male with COPD, HTN, tobacco use disorder and recent hospitalization for COPD exacerbation returning with shortness of breath, productive cough, wheezing and chest tightness and admitted for COPD exacerbation.   Hospitalized 11/20-11/23 for COPD exacerbation and discharged on albuterol inhaler as needed.  He returned to ED 11/25.work-up including CTA chest was negative except for emphysema.  He was discharged on p.o. prednisone and albuterol inhaler.    On IV Solu-Medrol, azithromycin, scheduled and as needed DuoNebs.  Subjective: Seen and examined earlier this morning.  No major events overnight of this morning.  He reports difficulty breathing and chest tightness.  Hardly speaks in full sentence.  BP elevated.  Objective: Vitals:   10/31/22 0908 10/31/22 1040 10/31/22 1236 10/31/22 1240  BP: (!) 171/100 (!) 183/117  (!) 170/100  Pulse:  89    Resp:  (!) 28    Temp:  98.4 F (36.9 C) 98.5 F (36.9 C)   TempSrc:  Oral Oral   SpO2:  94%      Examination:  GENERAL: Some distress due to work of breathing. HEENT: MMM.  Vision and hearing grossly intact.  NECK: Supple.  No apparent JVD.  RESP: Notable work of breathing.  Tachypneic to 30s.  Fair aeration bilaterally.  Hardly speaks in full sentence CVS:  RRR. Heart sounds normal.  ABD/GI/GU: BS+. Abd soft, NTND.  MSK/EXT:  Moves extremities.  Significant muscle mass and subcu fat loss. SKIN: no apparent skin lesion or wound NEURO: Awake, alert and oriented appropriately.  No apparent focal neuro deficit. PSYCH: Calm. Normal affect.   Procedures:  None  Microbiology summarized: COVID-19 and influenza PCR nonreactive.  Assessment and plan: Principal Problem:   COPD  with acute exacerbation (HCC) Active Problems:   Tobacco use disorder   Uncontrolled hypertension   COPD exacerbation (HCC)   Homelessness  COPD without acute exacerbation: Presents with SOB, chest tightness, wheezing and productive cough with whitish phlegm.  CTA chest from yesterday without PE or pneumonia.  CXR with hyperinflation suggesting COPD/emphysema.  COVID-19 and influenza PCR nonreactive.  Troponin and EKG without significant finding.  He also have runny nose.  Exacerbation likely due to ongoing smoking and noncompliance with meds.  He is not on controller either.  Still with significant work of breathing and tachypnea. -Increase Solu-Medrol to 80 mg twice daily -Anoro Ellipta.  DuoNeb every 4 hours and as needed -Encouraged smoking cessation. -Patient needs control on discharge.  Fill prescriptions as [facility] outpatient pharmacy -TOC consulted for PCP and homelessness   Uncontrolled hypertension: BP remain elevated partly due to anxiety from dyspnea. -Continue amlodipine 10 mg daily -Add Imdur 30 mg daily -P.o. hydralazine as needed with parameters   Tobacco use disorder: About 20-pack-year history.  Reports cutting down to 3 to 4 cigarettes a day recent hospitalization. -Encouraged smoking cessation. -Declined nicotine patch.   Homelessness -TOC consulted   Leukocytosis: Likely demargination from steroid.   There is no height or weight on file to calculate BMI.         DVT prophylaxis:  enoxaparin (LOVENOX) injection 40 mg Start: 10/30/22 2200  Code Status: Full code Family Communication: None at bedside Level of care: Med-Surg Status is: Inpatient Remains inpatient appropriate because: Due  to COPD exacerbation with respiratory distress and tachypnea   Final disposition: TBD Consultants:  None  Sch Meds:  Scheduled Meds:  amLODipine  10 mg Oral Daily   enoxaparin (LOVENOX) injection  40 mg Subcutaneous Q24H   ipratropium-albuterol  3 mL Nebulization Q6H    methylPREDNISolone (SOLU-MEDROL) injection  80 mg Intravenous Q12H   Followed by   Melene Muller ON 11/02/2022] predniSONE  40 mg Oral Q breakfast   Continuous Infusions:  cefTRIAXone (ROCEPHIN)  IV Stopped (10/30/22 1715)   PRN Meds:.guaiFENesin-dextromethorphan, hydrALAZINE, ipratropium-albuterol  Antimicrobials: Anti-infectives (From admission, onward)    Start     Dose/Rate Route Frequency Ordered Stop   10/30/22 1615  cefTRIAXone (ROCEPHIN) 1 g in sodium chloride 0.9 % 100 mL IVPB        1 g 200 mL/hr over 30 Minutes Intravenous Every 24 hours 10/30/22 1610 11/04/22 1614        I have personally reviewed the following labs and images: CBC: Recent Labs  Lab 10/25/22 0401 10/28/22 2206 10/30/22 1225  WBC 15.2* 15.4* 14.5*  NEUTROABS  --  11.1*  --   HGB 13.8 14.4 15.1  HCT 41.1 42.3 46.0  MCV 85.1 83.9 86.1  PLT 351 311 304   BMP &GFR Recent Labs  Lab 10/25/22 0401 10/28/22 2206 10/30/22 1225  NA 138 138 140  K 4.4 3.8 4.1  CL 101 101 102  CO2 29 30 32  GLUCOSE 115* 112* 107*  BUN 16 18 19   CREATININE 0.85 0.88 0.69  CALCIUM 8.9 8.4* 8.5*   Estimated Creatinine Clearance: 106.9 mL/min (by C-G formula based on SCr of 0.69 mg/dL). Liver & Pancreas: Recent Labs  Lab 10/28/22 2206  AST 25  ALT 40  ALKPHOS 40  BILITOT 0.5  PROT 6.3*  ALBUMIN 3.4*   No results for input(s): "LIPASE", "AMYLASE" in the last 168 hours. No results for input(s): "AMMONIA" in the last 168 hours. Diabetic: No results for input(s): "HGBA1C" in the last 72 hours. No results for input(s): "GLUCAP" in the last 168 hours. Cardiac Enzymes: No results for input(s): "CKTOTAL", "CKMB", "CKMBINDEX", "TROPONINI" in the last 168 hours. No results for input(s): "PROBNP" in the last 8760 hours. Coagulation Profile: No results for input(s): "INR", "PROTIME" in the last 168 hours. Thyroid Function Tests: No results for input(s): "TSH", "T4TOTAL", "FREET4", "T3FREE", "THYROIDAB" in the last 72  hours. Lipid Profile: No results for input(s): "CHOL", "HDL", "LDLCALC", "TRIG", "CHOLHDL", "LDLDIRECT" in the last 72 hours. Anemia Panel: No results for input(s): "VITAMINB12", "FOLATE", "FERRITIN", "TIBC", "IRON", "RETICCTPCT" in the last 72 hours. Urine analysis: No results found for: "COLORURINE", "APPEARANCEUR", "LABSPEC", "PHURINE", "GLUCOSEU", "HGBUR", "BILIRUBINUR", "KETONESUR", "PROTEINUR", "UROBILINOGEN", "NITRITE", "LEUKOCYTESUR" Sepsis Labs: Invalid input(s): "PROCALCITONIN", "LACTICIDVEN"  Microbiology: Recent Results (from the past 240 hour(s))  Resp Panel by RT-PCR (Flu A&B, Covid) Anterior Nasal Swab     Status: None   Collection Time: 10/23/22  4:42 AM   Specimen: Anterior Nasal Swab  Result Value Ref Range Status   SARS Coronavirus 2 by RT PCR NEGATIVE NEGATIVE Final    Comment: (NOTE) SARS-CoV-2 target nucleic acids are NOT DETECTED.  The SARS-CoV-2 RNA is generally detectable in upper respiratory specimens during the acute phase of infection. The lowest concentration of SARS-CoV-2 viral copies this assay can detect is 138 copies/mL. A negative result does not preclude SARS-Cov-2 infection and should not be used as the sole basis for treatment or other patient management decisions. A negative result may occur with  improper specimen collection/handling,  submission of specimen other than nasopharyngeal swab, presence of viral mutation(s) within the areas targeted by this assay, and inadequate number of viral copies(<138 copies/mL). A negative result must be combined with clinical observations, patient history, and epidemiological information. The expected result is Negative.  Fact Sheet for Patients:  BloggerCourse.com  Fact Sheet for Healthcare Providers:  SeriousBroker.it  This test is no t yet approved or cleared by the [place] FDA and  has been authorized for detection and/or diagnosis of SARS-CoV-2  by FDA under an Emergency Use Authorization (EUA). This EUA will remain  in effect (meaning this test can be used) for the duration of the COVID-19 declaration under Section 564(b)(1) of the Act, 21 U.S.C.section 360bbb-3(b)(1), unless the authorization is terminated  or revoked sooner.       Influenza A by PCR NEGATIVE NEGATIVE Final   Influenza B by PCR NEGATIVE NEGATIVE Final    Comment: (NOTE) The Xpert Xpress SARS-CoV-2/FLU/RSV plus assay is intended as an aid in the diagnosis of influenza from Nasopharyngeal swab specimens and should not be used as a sole basis for treatment. Nasal washings and aspirates are unacceptable for Xpert Xpress SARS-CoV-2/FLU/RSV testing.  Fact Sheet for Patients: BloggerCourse.com  Fact Sheet for Healthcare Providers: SeriousBroker.it  This test is not yet approved or cleared by the [place] FDA and has been authorized for detection and/or diagnosis of SARS-CoV-2 by FDA under an Emergency Use Authorization (EUA). This EUA will remain in effect (meaning this test can be used) for the duration of the COVID-19 declaration under Section 564(b)(1) of the Act, 21 U.S.C. section 360bbb-3(b)(1), unless the authorization is terminated or revoked.  Performed at [facility], [address] [place], [place] [address]   MRSA Next Gen by PCR, Nasal     Status: None   Collection Time: 10/24/22  3:15 AM   Specimen: Nasal Mucosa; Nasal Swab  Result Value Ref Range Status   MRSA by PCR Next Gen NOT DETECTED NOT DETECTED Final    Comment: (NOTE) The GeneXpert MRSA Assay (FDA approved for NASAL specimens only), is one component of a comprehensive MRSA colonization surveillance program. It is not intended to diagnose MRSA infection nor to guide or monitor treatment for MRSA infections. Test performance is not FDA approved in patients less than 38 years old. Performed at [facility], [address] [place], [place] [address]   Resp Panel by RT-PCR (Flu A&B, Covid) Anterior Nasal Swab     Status: None   Collection Time: 10/28/22 10:06 PM   Specimen: Anterior Nasal Swab  Result Value Ref Range Status   SARS Coronavirus 2 by RT PCR NEGATIVE NEGATIVE Final    Comment: (NOTE) SARS-CoV-2 target nucleic acids are NOT DETECTED.  The SARS-CoV-2 RNA is generally detectable in upper respiratory specimens during the acute phase of infection. The lowest concentration of SARS-CoV-2 viral copies this assay can detect is 138 copies/mL. A negative result does not preclude SARS-Cov-2 infection and should not be used as the sole basis for treatment or other patient management decisions. A negative result may occur with  improper specimen collection/handling, submission of specimen other than nasopharyngeal swab, presence of viral mutation(s) within the areas targeted by this assay, and inadequate number of viral copies(<138 copies/mL). A negative result must be combined with clinical observations, patient history, and epidemiological information. The expected result is Negative.  Fact Sheet for Patients:  BloggerCourse.com  Fact Sheet for Healthcare Providers:  SeriousBroker.it  This test is  no t yet approved or cleared by the [place] FDA and  has been authorized for detection and/or diagnosis of SARS-CoV-2 by FDA under an Emergency Use Authorization (EUA). This EUA will remain  in effect (meaning this test can be used) for the duration of the COVID-19 declaration under Section 564(b)(1) of the Act, 21 U.S.C.section 360bbb-3(b)(1), unless the authorization is terminated  or revoked sooner.       Influenza A by PCR NEGATIVE NEGATIVE Final   Influenza B by PCR NEGATIVE NEGATIVE Final    Comment: (NOTE) The Xpert Xpress SARS-CoV-2/FLU/RSV plus assay is intended as an aid in the diagnosis of  influenza from Nasopharyngeal swab specimens and should not be used as a sole basis for treatment. Nasal washings and aspirates are unacceptable for Xpert Xpress SARS-CoV-2/FLU/RSV testing.  Fact Sheet for Patients: BloggerCourse.comhttps://www.fda.gov/media/152166/download  Fact Sheet for Healthcare Providers: SeriousBroker.ithttps://www.fda.gov/media/152162/download  This test is not yet approved or cleared by the [place] FDA and has been authorized for detection and/or diagnosis of SARS-CoV-2 by FDA under an Emergency Use Authorization (EUA). This EUA will remain in effect (meaning this test can be used) for the duration of the COVID-19 declaration under Section 564(b)(1) of the Act, 21 U.S.C. section 360bbb-3(b)(1), unless the authorization is terminated or revoked.  Performed at [facility], [address] [place], [place] [address]   Resp Panel by RT-PCR (Flu A&B, Covid) Anterior Nasal Swab     Status: None   Collection Time: 10/30/22  6:45 PM   Specimen: Anterior Nasal Swab  Result Value Ref Range Status   SARS Coronavirus 2 by RT PCR NEGATIVE NEGATIVE Final    Comment: (NOTE) SARS-CoV-2 target nucleic acids are NOT DETECTED.  The SARS-CoV-2 RNA is generally detectable in upper respiratory specimens during the acute phase of infection. The lowest concentration of SARS-CoV-2 viral copies this assay can detect is 138 copies/mL. A negative result does not preclude SARS-Cov-2 infection and should not be used as the sole basis for treatment or other patient management decisions. A negative result may occur with  improper specimen collection/handling, submission of specimen other than nasopharyngeal swab, presence of viral mutation(s) within the areas targeted by this assay, and inadequate number of viral copies(<138 copies/mL). A negative result must be combined with clinical observations, patient history, and epidemiological information. The expected result is Negative.  Fact  Sheet for Patients:  BloggerCourse.comhttps://www.fda.gov/media/152166/download  Fact Sheet for Healthcare Providers:  SeriousBroker.ithttps://www.fda.gov/media/152162/download  This test is no t yet approved or cleared by the [place] FDA and  has been authorized for detection and/or diagnosis of SARS-CoV-2 by FDA under an Emergency Use Authorization (EUA). This EUA will remain  in effect (meaning this test can be used) for the duration of the COVID-19 declaration under Section 564(b)(1) of the Act, 21 U.S.C.section 360bbb-3(b)(1), unless the authorization is terminated  or revoked sooner.       Influenza A by PCR NEGATIVE NEGATIVE Final   Influenza B by PCR NEGATIVE NEGATIVE Final    Comment: (NOTE) The Xpert Xpress SARS-CoV-2/FLU/RSV plus assay is intended as an aid in the diagnosis of influenza from Nasopharyngeal swab specimens and should not be used as a sole basis for treatment. Nasal washings and aspirates are unacceptable for Xpert Xpress SARS-CoV-2/FLU/RSV testing.  Fact Sheet for Patients: BloggerCourse.comhttps://www.fda.gov/media/152166/download  Fact Sheet for Healthcare Providers: SeriousBroker.ithttps://www.fda.gov/media/152162/download  This test is not yet approved or cleared by the [place] FDA and has been authorized for detection and/or diagnosis of SARS-CoV-2 by FDA under  an Emergency Use Authorization (EUA). This EUA will remain in effect (meaning this test can be used) for the duration of the COVID-19 declaration under Section 564(b)(1) of the Act, 21 U.S.C. section 360bbb-3(b)(1), unless the authorization is terminated or revoked.  Performed at [facility], [address] [place], [place] [address]     Radiology Studies: No results found.    Jorje Vanatta T. Shamra Bradeen [facility]  If 7PM-7AM, please contact night-coverage [email or web address] 10/31/2022, 1:48 PM

## 2022-11-01 ENCOUNTER — Other Ambulatory Visit: Payer: Self-pay

## 2022-11-01 DIAGNOSIS — J441 Chronic obstructive pulmonary disease with (acute) exacerbation: Secondary | ICD-10-CM | POA: Diagnosis not present

## 2022-11-01 DIAGNOSIS — E440 Moderate protein-calorie malnutrition: Secondary | ICD-10-CM | POA: Insufficient documentation

## 2022-11-01 DIAGNOSIS — Z5900 Homelessness unspecified: Secondary | ICD-10-CM | POA: Diagnosis not present

## 2022-11-01 DIAGNOSIS — F17200 Nicotine dependence, unspecified, uncomplicated: Secondary | ICD-10-CM | POA: Diagnosis not present

## 2022-11-01 DIAGNOSIS — I10 Essential (primary) hypertension: Secondary | ICD-10-CM | POA: Diagnosis not present

## 2022-11-01 LAB — RENAL FUNCTION PANEL
Albumin: 3.2 g/dL — ABNORMAL LOW (ref 3.5–5.0)
Anion gap: 10 (ref 5–15)
BUN: 18 mg/dL (ref 6–20)
CO2: 31 mmol/L (ref 22–32)
Calcium: 9 mg/dL (ref 8.9–10.3)
Chloride: 95 mmol/L — ABNORMAL LOW (ref 98–111)
Creatinine, Ser: 0.88 mg/dL (ref 0.61–1.24)
GFR, Estimated: >60 mL/min (ref >60)
Glucose, Bld: 132 mg/dL — ABNORMAL HIGH (ref 70–99)
Phosphorus: 4.2 mg/dL (ref 2.5–4.6)
Potassium: 4.9 mmol/L (ref 3.5–5.1)
Sodium: 136 mmol/L (ref 135–145)

## 2022-11-01 LAB — CBC
HCT: 45.6 % (ref 39.0–52.0)
Hemoglobin: 15.1 g/dL (ref 13.0–17.0)
MCH: 28.3 pg (ref 26.0–34.0)
MCHC: 33.1 g/dL (ref 30.0–36.0)
MCV: 85.4 fL (ref 80.0–100.0)
Platelets: 313 10*3/uL (ref 150–400)
RBC: 5.34 MIL/uL (ref 4.22–5.81)
RDW: 13.5 % (ref 11.5–15.5)
WBC: 12.9 10*3/uL — ABNORMAL HIGH (ref 4.0–10.5)
nRBC: 0 % (ref 0.0–0.2)

## 2022-11-01 LAB — MAGNESIUM: Magnesium: 2.2 mg/dL (ref 1.7–2.4)

## 2022-11-01 MED ORDER — ARFORMOTEROL TARTRATE 15 MCG/2ML IN NEBU
15.0000 ug | INHALATION_SOLUTION | Freq: Two times a day (BID) | RESPIRATORY_TRACT | Status: DC
  Administered 2022-11-01 – 2022-11-07 (×12): 15 ug via RESPIRATORY_TRACT
  Filled 2022-11-01 (×12): qty 2

## 2022-11-01 MED ORDER — BUDESONIDE 0.25 MG/2ML IN SUSP
0.2500 mg | Freq: Two times a day (BID) | RESPIRATORY_TRACT | Status: DC
  Administered 2022-11-01 – 2022-11-07 (×12): 0.25 mg via RESPIRATORY_TRACT
  Filled 2022-11-01 (×12): qty 2

## 2022-11-01 MED ORDER — BENZONATATE 100 MG PO CAPS
100.0000 mg | ORAL_CAPSULE | Freq: Three times a day (TID) | ORAL | Status: DC
  Administered 2022-11-01 – 2022-11-07 (×18): 100 mg via ORAL
  Filled 2022-11-01 (×18): qty 1

## 2022-11-01 MED ORDER — METHYLPREDNISOLONE SODIUM SUCC 125 MG IJ SOLR
60.0000 mg | Freq: Every day | INTRAMUSCULAR | Status: DC
  Administered 2022-11-02 – 2022-11-03 (×2): 60 mg via INTRAVENOUS
  Filled 2022-11-01 (×2): qty 2

## 2022-11-01 MED ORDER — ADULT MULTIVITAMIN W/MINERALS CH
1.0000 | ORAL_TABLET | Freq: Every day | ORAL | Status: DC
  Administered 2022-11-01 – 2022-11-07 (×7): 1 via ORAL
  Filled 2022-11-01 (×7): qty 1

## 2022-11-01 MED ORDER — ENSURE ENLIVE PO LIQD
237.0000 mL | Freq: Two times a day (BID) | ORAL | Status: DC
  Administered 2022-11-01 – 2022-11-07 (×10): 237 mL via ORAL

## 2022-11-01 MED ORDER — LOSARTAN POTASSIUM 50 MG PO TABS
50.0000 mg | ORAL_TABLET | Freq: Every day | ORAL | Status: DC
  Administered 2022-11-01 – 2022-11-03 (×3): 50 mg via ORAL
  Filled 2022-11-01 (×3): qty 1

## 2022-11-01 NOTE — Progress Notes (Signed)
[facility]                                                                              Franklin Chavez, is a 61 y.o. male, DOB - 1960-12-21, [id number] Admit date - 10/30/2022    Outpatient Primary MD for the patient is Patient, No Pcp Per  LOS - 1  days  Chief Complaint  Patient presents with   Asthma       Brief summary   61 y.o. male with COPD, HTN, tobacco use disorder and recent hospitalization for COPD exacerbation returning with shortness of breath, productive cough, wheezing and chest tightness and admitted for COPD exacerbation.    Hospitalized 11/20-11/23 for COPD exacerbation and discharged on albuterol inhaler as needed.  He returned to ED 11/25.work-up including CTA chest was negative except for emphysema.  He was discharged on p.o. prednisone and albuterol inhaler.    Assessment & Plan    Principal Problem:   COPD with acute exacerbation (HCC) -Presented with shortness of breath, chest tightness, wheezing and productive cough -CTA chest negative for PE or pneumonia.  Chest x-ray with hyperinflation suggesting COPD, emphysema -COVID-19, influenza PCR negative -Troponins negative, EKG without any ischemia -Exacerbation likely due to ongoing smoking and noncompliant with meds, currently homeless, not using inhalers -Still has wheezing, willing to continue IV Solu-Medrol today, taper to 60 mg daily -Continue IV Rocephin -Placed on scheduled DuoNebs, added Pulmicort, Brovana, flutter valve -Will discharge on Ventolin inhaler, Anoro Ellipta    Active Problems:   Tobacco use disorder -Counseled on smoking cessation    Uncontrolled hypertension -BP still somewhat elevated, continue Imdur 30 mg daily, Norvasc 10 mg daily, added losartan 50 mg daily     Homelessness -TOC consulted    Malnutrition of moderate degree - Nutrition Problem: Moderate Malnutrition Etiology: chronic illness (COPD) Signs/Symptoms: moderate fat depletion, severe  muscle depletion Estimated body mass index is 23.02 kg/m as calculated from the following:   Height as of 10/28/22: 6' (1.829 m).   Weight as of 10/28/22: 77 kg.  Code Status: Full code DVT Prophylaxis:  enoxaparin (LOVENOX) injection 40 mg Start: 10/30/22 2200   Level of Care: Level of care: Med-Surg Family Communication:  Disposition Plan:      Remains inpatient appropriate: Still wheezing, hopefully DC in next 1 to 2 days   Procedures:  None  Consultants:   None  Antimicrobials:   Anti-infectives (From admission, onward)    Start     Dose/Rate Route Frequency Ordered Stop   10/30/22 1615  cefTRIAXone (ROCEPHIN) 1 g in sodium chloride 0.9 % 100 mL IVPB        1 g 200 mL/hr over 30 Minutes Intravenous Every 24 hours 10/30/22 1610 11/04/22 1614          Medications  amLODipine  10 mg Oral Daily   arformoterol  15 mcg Nebulization BID   benzonatate  100 mg Oral TID   budesonide (PULMICORT) nebulizer solution  0.25 mg Nebulization BID   enoxaparin (LOVENOX) injection  40 mg Subcutaneous Q24H   feeding supplement  237 mL Oral BID BM   guaiFENesin  600 mg  Oral BID   ipratropium-albuterol  3 mL Nebulization Q6H   isosorbide mononitrate  30 mg Oral Daily   methylPREDNISolone (SOLU-MEDROL) injection  60 mg Intravenous Daily   multivitamin with minerals  1 tablet Oral Daily      Subjective:   Franklin Chavez was seen and examined today.  Still wheezing, no fevers or chills, no chest pain. + Shortness of breath.  No acute events overnight.    Objective:   Vitals:   11/01/22 0226 11/01/22 0912 11/01/22 0914 11/01/22 0936  BP: (!) 149/96   (!) 162/97  Pulse: 88     Resp: 18     Temp: 99.6 F (37.6 C)     TempSrc: Oral     SpO2: 94% (!) 87% 91%     Intake/Output Summary (Last 24 hours) at 11/01/2022 1353 Last data filed at 11/01/2022 1245 Gross per 24 hour  Intake 1044 ml  Output 350 ml  Net 694 ml     Wt Readings from Last 3 Encounters:  10/28/22 77  kg  10/23/22 77.1 kg  01/18/20 74.8 kg     Exam General: Alert and oriented x 3, NAD Cardiovascular: S1 S2 auscultated,  RRR Respiratory: Bilateral expiratory wheezing Gastrointestinal: Soft, nontender, nondistended, + bowel sounds Ext: no pedal edema bilaterally Neuro: no new deficits Psych: Normal affect and demeanor, alert and oriented x3     Data Reviewed:  I have personally reviewed following labs    CBC Lab Results  Component Value Date   WBC 12.9 (H) 11/01/2022   RBC 5.34 11/01/2022   HGB 15.1 11/01/2022   HCT 45.6 11/01/2022   MCV 85.4 11/01/2022   MCH 28.3 11/01/2022   PLT 313 11/01/2022   MCHC 33.1 11/01/2022   RDW 13.5 11/01/2022   LYMPHSABS 1.7 10/28/2022   MONOABS 0.9 10/28/2022   EOSABS 1.6 (H) 10/28/2022   BASOSABS 0.1 10/28/2022     Last metabolic panel Lab Results  Component Value Date   NA 136 11/01/2022   K 4.9 11/01/2022   CL 95 (L) 11/01/2022   CO2 31 11/01/2022   BUN 18 11/01/2022   CREATININE 0.88 11/01/2022   GLUCOSE 132 (H) 11/01/2022   GFRNONAA >60 11/01/2022   GFRAA >60 12/10/2018   CALCIUM 9.0 11/01/2022   PHOS 4.2 11/01/2022   PROT 6.3 (L) 10/28/2022   ALBUMIN 3.2 (L) 11/01/2022   BILITOT 0.5 10/28/2022   ALKPHOS 40 10/28/2022   AST 25 10/28/2022   ALT 40 10/28/2022   ANIONGAP 10 11/01/2022    CBG (last 3)  No results for input(s): "GLUCAP" in the last 72 hours.    Coagulation Profile: No results for input(s): "INR", "PROTIME" in the last 168 hours.   Radiology Studies: I have personally reviewed the imaging studies  No results found.     Thad Ranger M.D. [facility] 11/01/2022, 1:53 PM  Available via Epic secure chat 7am-7pm After 7 pm, please refer to night coverage provider listed on amion.

## 2022-11-01 NOTE — TOC Initial Note (Signed)
Transition of Care [facility]) - Initial/Assessment Note    Patient Details  Name: Franklin Chavez MRN: [id number] Date of Birth: 25-Oct-1961  [facility] CM/SW Contact:    Vassie Moselle, LCSW Phone Number: 11/01/2022, 12:12 PM  Clinical Narrative:                 Met with pt who shares he lost his job a month ago and is now homeless due to having no income. Pt is aware of th [facility] and does stay there when he is able. Pt is agreeable to referrals for assistance being made on [facility] and is agreeable to having additional resources placed on his discharge paperwork.  Referrals have been made to community agencies on [facility] 360. Resources added to AVS. Pt has insurance and does not qualify for additional medication assistance. Information for PCP has been added to pt's follow-up.  No further TOC needs identified at this time. Please consult TOC should further needs arise.   Expected Discharge Plan: Homeless Shelter Barriers to Discharge: No Barriers Identified   Patient Goals and CMS Choice Patient states their goals for this hospitalization and ongoing recovery are:: To get help with getting a motel CMS Medicare.gov Compare Post Acute Care list provided to:: Patient Choice offered to / list presented to : Patient  Expected Discharge Plan and Services Expected Discharge Plan: Homeless Shelter In-house Referral: NA Discharge Planning Services: CM Consult Post Acute Care Choice: NA Living arrangements for the past 2 months: Homeless                 DME Arranged: N/A DME Agency: NA                  Prior Living Arrangements/Services Living arrangements for the past 2 months: Homeless   Patient language and need for interpreter reviewed:: Yes Do you feel safe going back to the place where you live?: Yes      Need for Family Participation in Patient Care: No (Comment) Care giver support system in place?: No (comment)   Criminal Activity/Legal Involvement  Pertinent to Current Situation/Hospitalization: No - Comment as needed  Activities of Daily Living      Permission Sought/Granted Permission sought to share information with : Case Manager                Emotional Assessment Appearance:: Appears stated age Attitude/Demeanor/Rapport: Engaged Affect (typically observed): Accepting Orientation: : Oriented to Self, Oriented to Place, Oriented to  Time, Oriented to Situation Alcohol / Substance Use: Not Applicable Psych Involvement: No (comment)  Admission diagnosis:  COPD exacerbation ([place]) [J44.1] COPD with acute exacerbation ([place]) [J44.1] Patient Active Problem List   Diagnosis Date Noted   Homelessness 10/30/2022   Arthritis 10/25/2022   COPD exacerbation ([place]) 10/24/2022   COPD with acute exacerbation ([place]) 10/23/2022   Hyperglycemia 10/23/2022   Hypokalemia 10/23/2022   Uncontrolled hypertension 10/23/2022   Tobacco use disorder 12/13/2018   Poor dentition 12/13/2018   Foreign body of left middle finger with infection 12/10/2018   PCP:  Patient, No Pcp Per Pharmacy:   [id number] [place], [place] [address]Phone: [phone]:: [phone]    Social Determinants of Health (SDOH) Interventions    Readmission Risk Interventions    11/01/2022   12:03 PM  Readmission Risk Prevention Plan  Transportation Screening Complete  PCP or Specialist Appt within 5-7 Days Complete  Home Care Screening Complete  Medication Review (RN CM) Complete

## 2022-11-01 NOTE — Progress Notes (Signed)
Patient is alert and oriented x 4. Patient refused Lovenox and stated he totally understand why he needs to have that medication. NP on call is notified.

## 2022-11-01 NOTE — Progress Notes (Signed)
Initial Nutrition Assessment  DOCUMENTATION CODES:   Non-severe (moderate) malnutrition in context of chronic illness  INTERVENTION:   -Ensure Plus High Protein po BID, each supplement provides 350 kcal and 20 grams of protein.   -Multivitamin with minerals daily  -Liberalize diet to regular given malnutrition  NUTRITION DIAGNOSIS:   Moderate Malnutrition related to chronic illness (COPD) as evidenced by moderate fat depletion, severe muscle depletion.  GOAL:   Patient will meet greater than or equal to 90% of their needs  MONITOR:   PO intake, Supplement acceptance, Labs, I & O's, Weight trends  REASON FOR ASSESSMENT:   Consult Assessment of nutrition requirement/status  ASSESSMENT:   61 y.o. male with COPD, HTN, tobacco use disorder and recent hospitalization for COPD exacerbation returning with shortness of breath, productive cough, wheezing and chest tightness and admitted for COPD exacerbation.  Patient in room, able to speak in short spurts. Pt states his appetite is good but when his breathing worsened it was had decreased. Pt consumed 45% of dinner last night. Reports he ate most of his breakfast this morning (pancakes, eggs, sausage). Will liberalize diet to maximize menu options. Pt agreeable to receiving Ensure and daily MVI. Denies any issues with chewing or swallowing.  Per patient, he has started to gain weight back. No significant weight loss noted in weight records.   Medications reviewed.  Labs reviewed.  NUTRITION - FOCUSED PHYSICAL EXAM:  Flowsheet Row Most Recent Value  Orbital Region Mild depletion  Upper Arm Region Moderate depletion  Thoracic and Lumbar Region Mild depletion  Buccal Region Moderate depletion  Temple Region Severe depletion  Clavicle Bone Region Severe depletion  Clavicle and Acromion Bone Region Severe depletion  Scapular Bone Region Severe depletion  Dorsal Hand Moderate depletion  Patellar Region Moderate depletion   Anterior Thigh Region Moderate depletion  Posterior Calf Region Moderate depletion  Edema (RD Assessment) None  Hair Reviewed  Eyes Reviewed  Mouth Reviewed  Skin Reviewed       Diet Order:   Diet Order             Diet regular Room service appropriate? Yes; Fluid consistency: Thin  Diet effective now                   EDUCATION NEEDS:   Education needs have been addressed  Skin:  Skin Assessment: Reviewed RN Assessment  Last BM:  11/29 -type 4  Height:   Ht Readings from Last 1 Encounters:  10/28/22 6' (1.829 m)    Weight:   Wt Readings from Last 1 Encounters:  10/28/22 77 kg    BMI:  22.9 kg/m^2  Estimated Nutritional Needs:   Kcal:  2300-2500  Protein:  110-120g  Fluid:  2.3L/day  Tilda Franco, MS, RD, LDN Inpatient Clinical Dietitian Contact information available via Amion

## 2022-11-02 DIAGNOSIS — F17200 Nicotine dependence, unspecified, uncomplicated: Secondary | ICD-10-CM | POA: Diagnosis not present

## 2022-11-02 DIAGNOSIS — Z5900 Homelessness unspecified: Secondary | ICD-10-CM | POA: Diagnosis not present

## 2022-11-02 DIAGNOSIS — J441 Chronic obstructive pulmonary disease with (acute) exacerbation: Secondary | ICD-10-CM | POA: Diagnosis not present

## 2022-11-02 MED ORDER — LORAZEPAM 1 MG PO TABS
1.0000 mg | ORAL_TABLET | Freq: Three times a day (TID) | ORAL | Status: DC | PRN
  Administered 2022-11-02 – 2022-11-04 (×5): 1 mg via ORAL
  Filled 2022-11-02 (×5): qty 1

## 2022-11-02 MED ORDER — BUSPIRONE HCL 5 MG PO TABS
5.0000 mg | ORAL_TABLET | Freq: Every day | ORAL | Status: DC
  Administered 2022-11-02 – 2022-11-07 (×6): 5 mg via ORAL
  Filled 2022-11-02 (×6): qty 1

## 2022-11-02 NOTE — Progress Notes (Signed)
[facility]                                                                              Franklin Chavez, is a 61 y.o. male, DOB - [birth year], AJO:[id number] Admit date - 10/30/2022    Outpatient Primary MD for the patient is Patient, No Pcp Per  LOS - 2  days  Chief Complaint  Patient presents with   Asthma       Brief summary   61 y.o. male with COPD, HTN, tobacco use disorder and recent hospitalization for COPD exacerbation returning with shortness of breath, productive cough, wheezing and chest tightness and admitted for COPD exacerbation.    Hospitalized 11/20-11/23 for COPD exacerbation and discharged on albuterol inhaler as needed.  He returned to ED 11/25.work-up including CTA chest was negative except for emphysema.  He was discharged on p.o. prednisone and albuterol inhaler.    Assessment & Plan    Principal Problem:   COPD with acute exacerbation (HCC) -Presented with shortness of breath, chest tightness, wheezing and productive cough -CTA chest negative for PE or pneumonia.  Chest x-ray with hyperinflation suggesting COPD, emphysema -COVID-19, influenza PCR negative -Troponins negative, EKG without any ischemia -Unfortunate situation, now homeless as he lost his job a month ago.  Noncompliant with meds, not using inhalers PTA -Still wheezing, although improving, continue IV Solu-Medrol, DuoNebs, Pulmicort, Brovana, flutter valve -Continue IV Rocephin -Placed on scheduled DuoNebs, added Pulmicort, Brovana, flutter valve -Will discharge on Ventolin inhaler, Anoro Ellipta    Active Problems:   Tobacco use disorder -Counseled on smoking cessation    Uncontrolled hypertension -BP still somewhat elevated, continue Imdur, Norvasc 10 mg daily, added losartan 50 mg on 11/29     Homelessness -TOC consulted, appreciate assistance    Malnutrition of moderate degree - Nutrition Problem: Moderate Malnutrition Etiology: chronic illness  (COPD) Signs/Symptoms: moderate fat depletion, severe muscle depletion Estimated body mass index is 23.02 kg/m as calculated from the following:   Height as of this encounter: 6' (1.829 m).   Weight as of this encounter: 77 kg.  Code Status: Full code DVT Prophylaxis:  enoxaparin (LOVENOX) injection 40 mg Start: 10/30/22 2200   Level of Care: Level of care: Med-Surg Family Communication:  Disposition Plan:      Remains inpatient appropriate: Not medically ready for discharge, still wheezing   Procedures:  None  Consultants:   None  Antimicrobials:   Anti-infectives (From admission, onward)    Start     Dose/Rate Route Frequency Ordered Stop   10/30/22 1615  cefTRIAXone (ROCEPHIN) 1 g in sodium chloride 0.9 % 100 mL IVPB        1 g 200 mL/hr over 30 Minutes Intravenous Every 24 hours 10/30/22 1610 11/04/22 1614          Medications  amLODipine  10 mg Oral Daily   arformoterol  15 mcg Nebulization BID   benzonatate  100 mg Oral TID   budesonide (PULMICORT) nebulizer solution  0.25 mg Nebulization BID   busPIRone  5 mg Oral Daily   enoxaparin (LOVENOX) injection  40 mg Subcutaneous Q24H   feeding supplement  237  mL Oral BID BM   guaiFENesin  600 mg Oral BID   ipratropium-albuterol  3 mL Nebulization Q6H   isosorbide mononitrate  30 mg Oral Daily   losartan  50 mg Oral Daily   methylPREDNISolone (SOLU-MEDROL) injection  60 mg Intravenous Daily   multivitamin with minerals  1 tablet Oral Daily      Subjective:   Franklin Chavez was seen and examined today.  Still wheezing, although improving, no fevers or chills, no cough, chest pain.  No acute events overnight.     Objective:   Vitals:   11/01/22 2049 11/02/22 0126 11/02/22 0520 11/02/22 0853  BP: (!) 155/92  (!) 149/101   Pulse: 87  72   Resp: 20  20   Temp: 98.7 F (37.1 C)  97.7 F (36.5 C)   TempSrc: Oral  Oral   SpO2: 98% 93% 97% 94%  Weight:      Height:        Intake/Output Summary (Last 24  hours) at 11/02/2022 1239 Last data filed at 11/02/2022 0400 Gross per 24 hour  Intake 1879 ml  Output 1800 ml  Net 79 ml     Wt Readings from Last 3 Encounters:  11/01/22 77 kg  10/28/22 77 kg  10/23/22 77.1 kg   Physical Exam General: Alert and oriented x 3, NAD Cardiovascular: S1 S2 clear, RRR.  Respiratory: Bilateral expiratory wheezing, improving from yesterday Gastrointestinal: Soft, nontender, nondistended, NBS Ext: no pedal edema bilaterally Neuro: no new deficits    Data Reviewed:  I have personally reviewed following labs    CBC Lab Results  Component Value Date   WBC 12.9 (H) 11/01/2022   RBC 5.34 11/01/2022   HGB 15.1 11/01/2022   HCT 45.6 11/01/2022   MCV 85.4 11/01/2022   MCH 28.3 11/01/2022   PLT 313 11/01/2022   MCHC 33.1 11/01/2022   RDW 13.5 11/01/2022   LYMPHSABS 1.7 10/28/2022   MONOABS 0.9 10/28/2022   EOSABS 1.6 (H) 10/28/2022   BASOSABS 0.1 10/28/2022     Last metabolic panel Lab Results  Component Value Date   NA 136 11/01/2022   K 4.9 11/01/2022   CL 95 (L) 11/01/2022   CO2 31 11/01/2022   BUN 18 11/01/2022   CREATININE 0.88 11/01/2022   GLUCOSE 132 (H) 11/01/2022   GFRNONAA >60 11/01/2022   GFRAA >60 12/10/2018   CALCIUM 9.0 11/01/2022   PHOS 4.2 11/01/2022   PROT 6.3 (L) 10/28/2022   ALBUMIN 3.2 (L) 11/01/2022   BILITOT 0.5 10/28/2022   ALKPHOS 40 10/28/2022   AST 25 10/28/2022   ALT 40 10/28/2022   ANIONGAP 10 11/01/2022    CBG (last 3)  No results for input(s): "GLUCAP" in the last 72 hours.    Coagulation Profile: No results for input(s): "INR", "PROTIME" in the last 168 hours.   Radiology Studies: I have personally reviewed the imaging studies  No results found.     Thad Ranger M.D. [facility] 11/02/2022, 12:39 PM  Available via Epic secure chat 7am-7pm After 7 pm, please refer to night coverage provider listed on amion.

## 2022-11-02 NOTE — Plan of Care (Signed)

## 2022-11-03 ENCOUNTER — Inpatient Hospital Stay (HOSPITAL_COMMUNITY): Payer: Commercial Managed Care - HMO

## 2022-11-03 DIAGNOSIS — Z5900 Homelessness unspecified: Secondary | ICD-10-CM | POA: Diagnosis not present

## 2022-11-03 DIAGNOSIS — I272 Pulmonary hypertension, unspecified: Secondary | ICD-10-CM | POA: Diagnosis not present

## 2022-11-03 DIAGNOSIS — J441 Chronic obstructive pulmonary disease with (acute) exacerbation: Secondary | ICD-10-CM | POA: Diagnosis not present

## 2022-11-03 DIAGNOSIS — F17200 Nicotine dependence, unspecified, uncomplicated: Secondary | ICD-10-CM | POA: Diagnosis not present

## 2022-11-03 LAB — BASIC METABOLIC PANEL
Anion gap: 4 — ABNORMAL LOW (ref 5–15)
BUN: 16 mg/dL (ref 6–20)
CO2: 38 mmol/L — ABNORMAL HIGH (ref 22–32)
Calcium: 8.9 mg/dL (ref 8.9–10.3)
Chloride: 96 mmol/L — ABNORMAL LOW (ref 98–111)
Creatinine, Ser: 0.84 mg/dL (ref 0.61–1.24)
GFR, Estimated: >60 mL/min (ref >60)
Glucose, Bld: 94 mg/dL (ref 70–99)
Potassium: 3.9 mmol/L (ref 3.5–5.1)
Sodium: 138 mmol/L (ref 135–145)

## 2022-11-03 LAB — RESPIRATORY PANEL BY PCR

## 2022-11-03 LAB — ECHOCARDIOGRAM COMPLETE
Area-P 1/2: 3.03 cm2
Calc EF: 69.9 %
Height: 72 in
S' Lateral: 2.4 cm
Single Plane A2C EF: 65 %
Single Plane A4C EF: 73.3 %
Weight: 2716.07 oz

## 2022-11-03 LAB — BRAIN NATRIURETIC PEPTIDE: B Natriuretic Peptide: 85.2 pg/mL (ref 0.0–100.0)

## 2022-11-03 LAB — CBC
HCT: 46.3 % (ref 39.0–52.0)
Hemoglobin: 15.1 g/dL (ref 13.0–17.0)
MCH: 28.7 pg (ref 26.0–34.0)
MCHC: 32.6 g/dL (ref 30.0–36.0)
MCV: 87.9 fL (ref 80.0–100.0)
Platelets: 290 10*3/uL (ref 150–400)
RBC: 5.27 MIL/uL (ref 4.22–5.81)
RDW: 13.7 % (ref 11.5–15.5)
WBC: 11.8 10*3/uL — ABNORMAL HIGH (ref 4.0–10.5)
nRBC: 0 % (ref 0.0–0.2)

## 2022-11-03 MED ORDER — REVEFENACIN 175 MCG/3ML IN SOLN
175.0000 ug | Freq: Every day | RESPIRATORY_TRACT | Status: DC
  Administered 2022-11-03 – 2022-11-07 (×5): 175 ug via RESPIRATORY_TRACT
  Filled 2022-11-03 (×5): qty 3

## 2022-11-03 MED ORDER — ISOSORBIDE MONONITRATE ER 60 MG PO TB24
60.0000 mg | ORAL_TABLET | Freq: Every day | ORAL | Status: DC
  Administered 2022-11-04 – 2022-11-07 (×4): 60 mg via ORAL
  Filled 2022-11-03 (×4): qty 1

## 2022-11-03 MED ORDER — AZITHROMYCIN 250 MG PO TABS
250.0000 mg | ORAL_TABLET | Freq: Every day | ORAL | Status: AC
  Administered 2022-11-04 – 2022-11-07 (×4): 250 mg via ORAL
  Filled 2022-11-03 (×4): qty 1

## 2022-11-03 MED ORDER — AZITHROMYCIN 250 MG PO TABS
500.0000 mg | ORAL_TABLET | Freq: Every day | ORAL | Status: AC
  Administered 2022-11-03: 500 mg via ORAL
  Filled 2022-11-03: qty 2

## 2022-11-03 MED ORDER — METHYLPREDNISOLONE SODIUM SUCC 40 MG IJ SOLR
40.0000 mg | Freq: Two times a day (BID) | INTRAMUSCULAR | Status: DC
  Administered 2022-11-03 – 2022-11-04 (×2): 40 mg via INTRAVENOUS
  Filled 2022-11-03 (×2): qty 1

## 2022-11-03 NOTE — Progress Notes (Signed)
   11/03/22 1328  Assess: MEWS Score  Temp 97.8 F (36.6 C)  BP (!) 143/85  MAP (mmHg) 102  Pulse Rate 93  Resp (!) 32  SpO2 97 %  O2 Device Nasal Cannula  O2 Flow Rate (L/min) 3 L/min  Assess: MEWS Score  MEWS Temp 0  MEWS Systolic 0  MEWS Pulse 0  MEWS RR 2  MEWS LOC 0  MEWS Score 2  MEWS Score Color Yellow  Assess: if the MEWS score is Yellow or Red  Were vital signs taken at a resting state? Yes  Focused Assessment No change from prior assessment  Does the patient meet 2 or more of the SIRS criteria? Yes  Does the patient have a confirmed or suspected source of infection? Yes  Provider and Rapid Response Notified? No  MEWS guidelines implemented *See Row Information* Yes  Treat  MEWS Interventions Administered scheduled meds/treatments;Escalated (See documentation below)  Take Vital Signs  Increase Vital Sign Frequency  Yellow: Q 2hr X 2 then Q 4hr X 2, if remains yellow, continue Q 4hrs  Escalate  MEWS: Escalate Yellow: discuss with charge nurse/RN and consider discussing with provider and RRT  Notify: Charge Nurse/RN  Name of Charge Nurse/RN Notified Nohealani Medinger  Date Charge Nurse/RN Notified 11/03/22  Time Charge Nurse/RN Notified 1330  Document  Patient Outcome Stabilized after interventions  Progress note created (see row info) Yes  Assess: SIRS CRITERIA  SIRS Temperature  0  SIRS Pulse 1  SIRS Respirations  1  SIRS WBC 1  SIRS Score Sum  3

## 2022-11-03 NOTE — Progress Notes (Signed)
  Echocardiogram 2D Echocardiogram has been performed.  Franklin Chavez 11/03/2022, 3:30 PM

## 2022-11-03 NOTE — Progress Notes (Signed)
[facility]                                                                              Franklin Chavez, is a 61 y.o. male, DOB - [birth year], QQP:[id number] Admit date - 10/30/2022    Outpatient Primary MD for the patient is Patient, No Pcp Per  LOS - 3  days  Chief Complaint  Patient presents with   Asthma       Brief summary   61 y.o. male with COPD, HTN, tobacco use disorder and recent hospitalization for COPD exacerbation returning with shortness of breath, productive cough, wheezing and chest tightness and admitted for COPD exacerbation.    Hospitalized 11/20-11/23 for COPD exacerbation and discharged on albuterol inhaler as needed.  He returned to ED 11/25.work-up including CTA chest was negative except for emphysema.  He was discharged on p.o. prednisone and albuterol inhaler.    Assessment & Plan    Principal Problem:   COPD with acute exacerbation (HCC), dyspnea with minimal exertion -Presented with shortness of breath, chest tightness, wheezing and productive cough -CTA chest negative for PE or pneumonia.  Chest x-ray with hyperinflation suggesting COPD, emphysema -COVID-19, influenza PCR negative -Troponins negative, EKG without any ischemia -Still wheezing, dyspneic with minimal exertion, continue IV Solu-Medrol, DuoNebs, Pulmicort, Brovana, flutter valve -Continue IV Rocephin -Requested pulmonology consult -Troponins 14-10, check BNP, 2D echo  Active Problems:   Tobacco use disorder -Counseled on smoking cessation    Uncontrolled hypertension -Continue Imdur, Norvasc, losartan     Homelessness -TOC consulted, appreciate assistance    Malnutrition of moderate degree - Nutrition Problem: Moderate Malnutrition Etiology: chronic illness (COPD) Signs/Symptoms: moderate fat depletion, severe muscle depletion Estimated body mass index is 23.02 kg/m as calculated from the following:   Height as of this encounter: 6' (1.829 m).   Weight as of  this encounter: 77 kg.  Code Status: Full code DVT Prophylaxis:  enoxaparin (LOVENOX) injection 40 mg Start: 10/30/22 2200   Level of Care: Level of care: Med-Surg Family Communication:  Disposition Plan:      Remains inpatient appropriate: Awaiting pulmonology work-up, 2D echo  Procedures:  None  Consultants:   Pulmonology  Antimicrobials:   Anti-infectives (From admission, onward)    Start     Dose/Rate Route Frequency Ordered Stop   10/30/22 1615  cefTRIAXone (ROCEPHIN) 1 g in sodium chloride 0.9 % 100 mL IVPB        1 g 200 mL/hr over 30 Minutes Intravenous Every 24 hours 10/30/22 1610 11/04/22 1614          Medications  amLODipine  10 mg Oral Daily   arformoterol  15 mcg Nebulization BID   benzonatate  100 mg Oral TID   budesonide (PULMICORT) nebulizer solution  0.25 mg Nebulization BID   busPIRone  5 mg Oral Daily   enoxaparin (LOVENOX) injection  40 mg Subcutaneous Q24H   feeding supplement  237 mL Oral BID BM   guaiFENesin  600 mg Oral BID   ipratropium-albuterol  3 mL Nebulization Q6H   isosorbide mononitrate  30 mg Oral Daily   losartan  50 mg Oral Daily  methylPREDNISolone (SOLU-MEDROL) injection  60 mg Intravenous Daily   multivitamin with minerals  1 tablet Oral Daily      Subjective:   Franklin Chavez was seen and examined today.  Still wheezing and dyspneic with minimal exertion.  No chest pain, fevers or chills, cough. Objective:   Vitals:   11/03/22 0401 11/03/22 0402 11/03/22 0823 11/03/22 1328  BP: (!) 143/104   (!) 143/85  Pulse: 81 76  93  Resp: 20   (!) 32  Temp: 98.9 F (37.2 C)   97.8 F (36.6 C)  TempSrc:      SpO2: (!) 89% 90% 94% 97%  Weight:      Height:        Intake/Output Summary (Last 24 hours) at 11/03/2022 1355 Last data filed at 11/03/2022 0905 Gross per 24 hour  Intake 1997 ml  Output 1325 ml  Net 672 ml     Wt Readings from Last 3 Encounters:  11/01/22 77 kg  10/28/22 77 kg  10/23/22 77.1 kg   Physical  Exam General: Alert and oriented x 3, NAD Cardiovascular: S1 S2 clear, RRR.  Respiratory: Bilateral expiratory Gastrointestinal: Soft, nontender, nondistended, NBS Ext: no pedal edema bilaterally Neuro: no new deficits Psych: Normal affect     Data Reviewed:  I have personally reviewed following labs    CBC Lab Results  Component Value Date   WBC 11.8 (H) 11/03/2022   RBC 5.27 11/03/2022   HGB 15.1 11/03/2022   HCT 46.3 11/03/2022   MCV 87.9 11/03/2022   MCH 28.7 11/03/2022   PLT 290 11/03/2022   MCHC 32.6 11/03/2022   RDW 13.7 11/03/2022   LYMPHSABS 1.7 10/28/2022   MONOABS 0.9 10/28/2022   EOSABS 1.6 (H) 10/28/2022   BASOSABS 0.1 10/28/2022     Last metabolic panel Lab Results  Component Value Date   NA 138 11/03/2022   K 3.9 11/03/2022   CL 96 (L) 11/03/2022   CO2 38 (H) 11/03/2022   BUN 16 11/03/2022   CREATININE 0.84 11/03/2022   GLUCOSE 94 11/03/2022   GFRNONAA >60 11/03/2022   GFRAA >60 12/10/2018   CALCIUM 8.9 11/03/2022   PHOS 4.2 11/01/2022   PROT 6.3 (L) 10/28/2022   ALBUMIN 3.2 (L) 11/01/2022   BILITOT 0.5 10/28/2022   ALKPHOS 40 10/28/2022   AST 25 10/28/2022   ALT 40 10/28/2022   ANIONGAP 4 (L) 11/03/2022    CBG (last 3)  No results for input(s): "GLUCAP" in the last 72 hours.    Coagulation Profile: No results for input(s): "INR", "PROTIME" in the last 168 hours.   Radiology Studies: I have personally reviewed the imaging studies  DG CHEST PORT 1 VIEW  Result Date: 11/03/2022 CLINICAL DATA:  Hypoxia EXAM: PORTABLE CHEST 1 VIEW COMPARISON:  Radiograph 10/30/2022 FINDINGS: Unchanged cardiomediastinal silhouette. There is no focal airspace consolidation. Pulmonary hyperinflation with apical predominant emphysema. There is no pleural effusion or evidence of pneumothorax. No acute osseous abnormality. IMPRESSION: Findings of COPD.  No focal airspace consolidation. Electronically Signed   By: Caprice Renshaw M.D.   On: 11/03/2022 13:30        Shajuana Mclucas M.D. [facility] 11/03/2022, 1:55 PM  Available via Epic secure chat 7am-7pm After 7 pm, please refer to night coverage provider listed on amion.

## 2022-11-03 NOTE — Consult Note (Signed)
NAME:  Franklin Chavez, MRN:  [id number], DOB:  18-Aug-1961, LOS: 3 ADMISSION DATE:  10/30/2022, CONSULTATION DATE:  11/03/2022 REFERRING MD:  Dr. Isidoro Donning, CHIEF COMPLAINT:  Dyspnea. AECOPD   History of Present Illness:  61 year old male with recent admission 11/20-11/23 with COPD exacerbation. Presented at that time with productive cough, fever, chills, and night sweats for 2 weeks. CXR with no acute. Treated with inhalers and steroids. Discharged with no oxygen requirements. Presents back to ED 11/25 with shortness of breath. CTA negative for PE, noted emphysema. Treated with nebs and solu-medrol. Presents 11/27 with ongoing shortness of breath and wheezing with reported runny nose and productive cough with white phlegm. On arrival oxygen saturation 90%, placed on 4L [place]. Given solu-medrol, IV mag, and nebs, and rocephin. 12/1 pulmonary consulted given ongoing dyspnea.   Pertinent  Medical History  COPD, HTN, Tobacco use disorder (20 pack-year-history), homelessness   Significant Hospital Events: Including procedures, antibiotic start and stop dates in addition to other pertinent events   11/27 > Admitted  Interim History / Subjective:  As above.   Objective   Blood pressure (!) 143/104, pulse 76, temperature 98.9 F (37.2 C), resp. rate 20, height 6' (1.829 m), weight 77 kg, SpO2 94 %.        Intake/Output Summary (Last 24 hours) at 11/03/2022 1146 Last data filed at 11/03/2022 8453 Gross per 24 hour  Intake 1997 ml  Output 1125 ml  Net 872 ml   Filed Weights   11/01/22 1900  Weight: 77 kg    Examination: General: ill appearing adult male, lying in bed  HENT: nasal cannula in place, dry MM  Lungs: exp wheezes left, diminished breaths sounds, mild accessory muscle use  Cardiovascular: HR 92 Abdomen: non-distended Extremities: -edema Neuro: opens eyes with physical stimulation, does not keep them open, oriented GU: deferred   Resolved Hospital Problem list     Assessment &  Plan:   Acute Hypoxic Respiratory Failure with presumed AECOPD -COVID/Flu negative  -CTA Chest 11/26 with advanced emphysema  Plan - Titrate Supplemental oxygen for saturation goal >88 - Encourage good pulmonary hygiene  - Repeat CXR pending  - Send RVP  - CTA 11/26 with mildly distended pulmonary trunk > obtain ECHO to evaluate for pulmonary HTN  - Continue scheduled nebs, change duoneb scheduled to PRN and add yuperlri - add solu-medrol to 40 mg BID   Best Practice (right click and "Reselect all SmartList Selections" daily)   Per primary team   Labs   CBC: Recent Labs  Lab 10/28/22 2206 10/30/22 1225 11/01/22 0545 11/03/22 0726  WBC 15.4* 14.5* 12.9* 11.8*  NEUTROABS 11.1*  --   --   --   HGB 14.4 15.1 15.1 15.1  HCT 42.3 46.0 45.6 46.3  MCV 83.9 86.1 85.4 87.9  PLT 311 304 313 290    Basic Metabolic Panel: Recent Labs  Lab 10/28/22 2206 10/30/22 1225 11/01/22 0545 11/03/22 0726  NA 138 140 136 138  K 3.8 4.1 4.9 3.9  CL 101 102 95* 96*  CO2 30 32 31 38*  GLUCOSE 112* 107* 132* 94  BUN 18 19 18 16   CREATININE 0.88 0.69 0.88 0.84  CALCIUM 8.4* 8.5* 9.0 8.9  MG  --   --  2.2  --   PHOS  --   --  4.2  --    GFR: Estimated Creatinine Clearance: 101.9 mL/min (by C-G formula based on SCr of 0.84 mg/dL). Recent Labs  Lab  10/28/22 2206 10/30/22 1225 11/01/22 0545 11/03/22 0726  WBC 15.4* 14.5* 12.9* 11.8*    Liver Function Tests: Recent Labs  Lab 10/28/22 2206 11/01/22 0545  AST 25  --   ALT 40  --   ALKPHOS 40  --   BILITOT 0.5  --   PROT 6.3*  --   ALBUMIN 3.4* 3.2*   No results for input(s): "LIPASE", "AMYLASE" in the last 168 hours. No results for input(s): "AMMONIA" in the last 168 hours.  ABG    Component Value Date/Time   HCO3 35.2 (H) 10/28/2022 2206   O2SAT 89.1 10/28/2022 2206     Coagulation Profile: No results for input(s): "INR", "PROTIME" in the last 168 hours.  Cardiac Enzymes: No results for input(s): "CKTOTAL",  "CKMB", "CKMBINDEX", "TROPONINI" in the last 168 hours.  HbA1C: No results found for: "HGBA1C"  CBG: No results for input(s): "GLUCAP" in the last 168 hours.  Review of Systems:   +SOB, +Cough, Denies sputum production   Past Medical History:  He,  has a past medical history of Poor dentition (12/13/2018) and Tobacco use disorder (12/13/2018).   Surgical History:  History reviewed. No pertinent surgical history.   Social History:   reports that he has been smoking. He has never used smokeless tobacco. He reports that he does not drink alcohol and does not use drugs.   Family History:  His family history includes Diabetes Mellitus II in his mother; Hypertension in his mother.   Allergies No Known Allergies   Home Medications  Prior to Admission medications   Medication Sig Start Date End Date Taking? Authorizing Provider  albuterol (VENTOLIN HFA) 108 (90 Base) MCG/ACT inhaler Inhale 1-2 puffs into the lungs every 6 (six) hours as needed for wheezing or shortness of breath. Patient not taking: Reported on 10/30/2022 10/26/22   Arrien, York Ram, MD  albuterol (VENTOLIN HFA) 108 (90 Base) MCG/ACT inhaler Inhale 1-2 puffs into the lungs every 6 (six) hours as needed for wheezing or shortness of breath. Patient not taking: Reported on 10/30/2022 10/29/22   Palumbo, April, MD  amLODipine (NORVASC) 10 MG tablet Take 1 tablet (10 mg total) by mouth daily. Patient not taking: Reported on 10/30/2022 10/27/22 11/26/22  Arrien, York Ram, MD  predniSONE (DELTASONE) 20 MG tablet 2 tabs po daily x 4 days Patient not taking: Reported on 10/30/2022 10/29/22   Nicanor Alcon, April, MD     Time Spent: 42 minutes     Jovita Kussmaul, AGACNP-BC [facility]  PCCM Pgr: [phone]

## 2022-11-04 DIAGNOSIS — F17200 Nicotine dependence, unspecified, uncomplicated: Secondary | ICD-10-CM | POA: Diagnosis not present

## 2022-11-04 DIAGNOSIS — J441 Chronic obstructive pulmonary disease with (acute) exacerbation: Secondary | ICD-10-CM | POA: Diagnosis not present

## 2022-11-04 DIAGNOSIS — Z5900 Homelessness unspecified: Secondary | ICD-10-CM | POA: Diagnosis not present

## 2022-11-04 DIAGNOSIS — I10 Essential (primary) hypertension: Secondary | ICD-10-CM | POA: Diagnosis not present

## 2022-11-04 LAB — BASIC METABOLIC PANEL
Anion gap: 7 (ref 5–15)
BUN: 12 mg/dL (ref 6–20)
CO2: 36 mmol/L — ABNORMAL HIGH (ref 22–32)
Calcium: 8.8 mg/dL — ABNORMAL LOW (ref 8.9–10.3)
Chloride: 96 mmol/L — ABNORMAL LOW (ref 98–111)
Creatinine, Ser: 0.77 mg/dL (ref 0.61–1.24)
GFR, Estimated: >60 mL/min (ref >60)
Glucose, Bld: 130 mg/dL — ABNORMAL HIGH (ref 70–99)
Potassium: 4.4 mmol/L (ref 3.5–5.1)
Sodium: 139 mmol/L (ref 135–145)

## 2022-11-04 MED ORDER — OSELTAMIVIR PHOSPHATE 75 MG PO CAPS
75.0000 mg | ORAL_CAPSULE | Freq: Two times a day (BID) | ORAL | Status: DC
  Administered 2022-11-04 – 2022-11-07 (×6): 75 mg via ORAL
  Filled 2022-11-04 (×6): qty 1

## 2022-11-04 MED ORDER — METHYLPREDNISOLONE SODIUM SUCC 40 MG IJ SOLR: 40.0000 mg | Freq: Every day | INTRAMUSCULAR | Status: DC

## 2022-11-04 MED ORDER — LOSARTAN POTASSIUM 50 MG PO TABS
100.0000 mg | ORAL_TABLET | Freq: Every day | ORAL | Status: DC
  Administered 2022-11-04 – 2022-11-07 (×4): 100 mg via ORAL
  Filled 2022-11-04 (×4): qty 2

## 2022-11-04 NOTE — Progress Notes (Signed)
[facility]                                                                              Franklin Chavez, is a 61 y.o. male, DOB - 1961/04/09, CX:[id number] Admit date - 10/30/2022    Outpatient Primary MD for the patient is Patient, No Pcp Per  LOS - 4  days  Chief Complaint  Patient presents with   Asthma       Brief summary   61 y.o. male with COPD, HTN, tobacco use disorder and recent hospitalization for COPD exacerbation returning with shortness of breath, productive cough, wheezing and chest tightness and admitted for COPD exacerbation.    Hospitalized 11/20-11/23 for COPD exacerbation and discharged on albuterol inhaler as needed.  He returned to ED 11/25.work-up including CTA chest was negative except for emphysema.  He was discharged on p.o. prednisone and albuterol inhaler.    Assessment & Plan    Principal Problem:   COPD with acute exacerbation ([place]), dyspnea with minimal exertion -Presented with shortness of breath, chest tightness, wheezing and productive cough -CTA chest negative for PE or pneumonia.  CXR with hyperinflation suggesting COPD, emphysema -COVID-19, influenza PCR negative -Troponins negative, EKG without any ischemia -Wheezing improving, seen by PCCM on 12/1, recommended Brovana, Pulmicort, Yupelri and increased Solu-Medrol to 40 mg every 12 hours -Continue Zithromax, completed ceftriaxone -Will need outpatient follow-up with pulmonology -Troponins 14-10, 2D echo showed EF 60-65%, G2 DD  Active Problems:   Tobacco use disorder -Counseled on smoking cessation    Uncontrolled hypertension -BP now much improved, continue Norvasc, losartan, Imdur     Homelessness -TOC consulted, appreciate assistance    Malnutrition of moderate degree - Nutrition Problem: Moderate Malnutrition Etiology: chronic illness (COPD) Signs/Symptoms: moderate fat depletion, severe muscle depletion Estimated body mass index is 23.02 kg/m as calculated  from the following:   Height as of this encounter: 6' (1.829 m).   Weight as of this encounter: 77 kg.  Code Status: Full code DVT Prophylaxis:  enoxaparin (LOVENOX) injection 40 mg Start: 10/30/22 2200   Level of Care: Level of care: Med-Surg Family Communication:  Disposition Plan:      Remains inpatient appropriate: Hopefully DC home in next 24 to 48 hours if clinically improving  Procedures:  None  Consultants:   Pulmonology  Antimicrobials:   Anti-infectives (From admission, onward)    Start     Dose/Rate Route Frequency Ordered Stop   11/04/22 1000  azithromycin (ZITHROMAX) tablet 250 mg       See Hyperspace for full Linked Orders Report.   250 mg Oral Daily 11/03/22 1540 11/08/22 0959   11/03/22 1630  azithromycin (ZITHROMAX) tablet 500 mg       See Hyperspace for full Linked Orders Report.   500 mg Oral Daily 11/03/22 1540 11/03/22 1617   10/30/22 1615  cefTRIAXone (ROCEPHIN) 1 g in sodium chloride 0.9 % 100 mL IVPB        1 g 200 mL/hr over 30 Minutes Intravenous Every 24 hours 10/30/22 1610 11/03/22 1752          Medications  amLODipine  10 mg Oral Daily  arformoterol  15 mcg Nebulization BID   azithromycin  250 mg Oral Daily   benzonatate  100 mg Oral TID   budesonide (PULMICORT) nebulizer solution  0.25 mg Nebulization BID   busPIRone  5 mg Oral Daily   enoxaparin (LOVENOX) injection  40 mg Subcutaneous Q24H   feeding supplement  237 mL Oral BID BM   guaiFENesin  600 mg Oral BID   isosorbide mononitrate  60 mg Oral Daily   losartan  100 mg Oral Daily   methylPREDNISolone (SOLU-MEDROL) injection  40 mg Intravenous Q12H   multivitamin with minerals  1 tablet Oral Daily   revefenacin  175 mcg Nebulization Daily      Subjective:   Franklin Chavez was seen and examined today.  Still feels winded, wheezing, no fevers or chills.    Objective:   Vitals:   11/03/22 2106 11/04/22 0231 11/04/22 0807 11/04/22 1216  BP: (!) 146/88 (!) 154/102  128/86   Pulse: 86 72  97  Resp: 20 20  20   Temp: 99.8 F (37.7 C) 98.9 F (37.2 C)  99 F (37.2 C)  TempSrc:    Oral  SpO2: 96% 98% 98%   Weight:      Height:        Intake/Output Summary (Last 24 hours) at 11/04/2022 1341 Last data filed at 11/04/2022 0900 Gross per 24 hour  Intake --  Output 750 ml  Net -750 ml     Wt Readings from Last 3 Encounters:  11/01/22 77 kg  10/28/22 77 kg  10/23/22 77.1 kg   Physical Exam General: Alert and oriented x 3, NAD Cardiovascular: S1 S2 clear, RRR.  Respiratory: Bilateral expiratory wheezing Gastrointestinal: Soft, nontender, nondistended, NBS Ext: no pedal edema bilaterally Neuro: no new deficits psych: flat affect    Data Reviewed:  I have personally reviewed following labs    CBC Lab Results  Component Value Date   WBC 11.8 (H) 11/03/2022   RBC 5.27 11/03/2022   HGB 15.1 11/03/2022   HCT 46.3 11/03/2022   MCV 87.9 11/03/2022   MCH 28.7 11/03/2022   PLT 290 11/03/2022   MCHC 32.6 11/03/2022   RDW 13.7 11/03/2022   LYMPHSABS 1.7 10/28/2022   MONOABS 0.9 10/28/2022   EOSABS 1.6 (H) 10/28/2022   BASOSABS 0.1 10/28/2022     Last metabolic panel Lab Results  Component Value Date   NA 139 11/04/2022   K 4.4 11/04/2022   CL 96 (L) 11/04/2022   CO2 36 (H) 11/04/2022   BUN 12 11/04/2022   CREATININE 0.77 11/04/2022   GLUCOSE 130 (H) 11/04/2022   GFRNONAA >60 11/04/2022   GFRAA >60 12/10/2018   CALCIUM 8.8 (L) 11/04/2022   PHOS 4.2 11/01/2022   PROT 6.3 (L) 10/28/2022   ALBUMIN 3.2 (L) 11/01/2022   BILITOT 0.5 10/28/2022   ALKPHOS 40 10/28/2022   AST 25 10/28/2022   ALT 40 10/28/2022   ANIONGAP 7 11/04/2022    CBG (last 3)  No results for input(s): "GLUCAP" in the last 72 hours.    Coagulation Profile: No results for input(s): "INR", "PROTIME" in the last 168 hours.   Radiology Studies: I have personally reviewed the imaging studies  ECHOCARDIOGRAM COMPLETE  Result Date: 11/03/2022    ECHOCARDIOGRAM  REPORT   Patient Name:   Franklin Chavez Date of Exam: 11/03/2022 Medical Rec #:  14/12/2021       Height:       72.0 in Accession #:  [id number]      Weight:       169.8 lb Date of Birth:  1961-07-20       BSA:          1.987 m Patient Age:    61 years        BP:           143/85 mmHg Patient Gender: M               HR:           87 bpm. Exam Location:  Inpatient Procedure: 2D Echo, Color Doppler and Cardiac Doppler Indications:    Pulmonary hypertension  History:        Patient has no prior history of Echocardiogram examinations.                 COPD, Signs/Symptoms:Shortness of Breath; Risk                 Factors:Hypertension and Current Smoker.  Sonographer:    Milda Smart Referring Phys: [id number] Tobey Grim  Sonographer Comments: Suboptimal parasternal window. Image acquisition challenging due to patient body habitus and Image acquisition challenging due to respiratory motion. IMPRESSIONS  1. Left ventricular ejection fraction, by estimation, is 60 to 65%. The left ventricle has normal function. The left ventricle has no regional wall motion abnormalities. There is moderate concentric left ventricular hypertrophy. Left ventricular diastolic parameters are consistent with Grade II diastolic dysfunction (pseudonormalization).  2. Right ventricular systolic function is normal. The right ventricular size is normal.  3. The mitral valve is normal in structure. No evidence of mitral valve regurgitation. No evidence of mitral stenosis.  4. The aortic valve is normal in structure. Aortic valve regurgitation is not visualized. No aortic stenosis is present.  5. The inferior vena cava is normal in size with greater than 50% respiratory variability, suggesting right atrial pressure of 3 mmHg. Comparison(s): No prior Echocardiogram. FINDINGS  Left Ventricle: Left ventricular ejection fraction, by estimation, is 60 to 65%. The left ventricle has normal function. The left ventricle has no regional wall motion  abnormalities. The left ventricular internal cavity size was normal in size. There is  moderate concentric left ventricular hypertrophy. Left ventricular diastolic parameters are consistent with Grade II diastolic dysfunction (pseudonormalization). Right Ventricle: The right ventricular size is normal. Right ventricular systolic function is normal. Left Atrium: Left atrial size was normal in size. Right Atrium: Right atrial size was normal in size. Pericardium: There is no evidence of pericardial effusion. Mitral Valve: The mitral valve is normal in structure. No evidence of mitral valve regurgitation. No evidence of mitral valve stenosis. Tricuspid Valve: The tricuspid valve is normal in structure. Tricuspid valve regurgitation is not demonstrated. No evidence of tricuspid stenosis. Aortic Valve: The aortic valve is normal in structure. Aortic valve regurgitation is not visualized. No aortic stenosis is present. Pulmonic Valve: The pulmonic valve was normal in structure. Pulmonic valve regurgitation is not visualized. No evidence of pulmonic stenosis. Aorta: The aortic root is normal in size and structure. Venous: The inferior vena cava is normal in size with greater than 50% respiratory variability, suggesting right atrial pressure of 3 mmHg. IAS/Shunts: No atrial level shunt detected by color flow Doppler.  LEFT VENTRICLE PLAX 2D LVIDd:         3.70 cm      Diastology LVIDs:         2.40 cm      LV e' medial:  5.98 cm/s LV PW:         1.20 cm      LV E/e' medial:  10.3 LV IVS:        1.10 cm      LV e' lateral:   11.50 cm/s                             LV E/e' lateral: 5.3  LV Volumes (MOD) LV vol d, MOD A2C: 85.7 ml LV vol d, MOD A4C: 100.0 ml LV vol s, MOD A2C: 30.0 ml LV vol s, MOD A4C: 26.7 ml LV SV MOD A2C:     55.7 ml LV SV MOD A4C:     100.0 ml LV SV MOD BP:      68.5 ml RIGHT VENTRICLE RV S prime:     14.80 cm/s TAPSE (M-mode): 2.0 cm LEFT ATRIUM             Index        RIGHT ATRIUM           Index LA  diam:        3.10 cm 1.56 cm/m   RA Area:     13.10 cm LA Vol (A2C):   68.1 ml 34.27 ml/m  RA Volume:   29.00 ml  14.59 ml/m LA Vol (A4C):   24.7 ml 12.43 ml/m LA Biplane Vol: 44.1 ml 22.19 ml/m  AORTIC VALVE             PULMONIC VALVE LVOT Vmax:   122.00 cm/s PV Vmax:       1.53 m/s LVOT Vmean:  83.900 cm/s PV Vmean:      110.000 cm/s LVOT VTI:    0.180 m     PV VTI:        0.261 m                          PV Peak grad:  9.4 mmHg                          PV Mean grad:  5.0 mmHg  MITRAL VALVE MV Area (PHT): 3.03 cm    SHUNTS MV Decel Time: 250 msec    Systemic VTI: 0.18 m MV E velocity: 61.40 cm/s MV A velocity: 47.60 cm/s MV E/A ratio:  1.29 Kirk Ruths MD Electronically signed by Kirk Ruths MD Signature Date/Time: 11/03/2022/3:50:40 PM    Final    DG CHEST PORT 1 VIEW  Result Date: 11/03/2022 CLINICAL DATA:  Hypoxia EXAM: PORTABLE CHEST 1 VIEW COMPARISON:  Radiograph 10/30/2022 FINDINGS: Unchanged cardiomediastinal silhouette. There is no focal airspace consolidation. Pulmonary hyperinflation with apical predominant emphysema. There is no pleural effusion or evidence of pneumothorax. No acute osseous abnormality. IMPRESSION: Findings of COPD.  No focal airspace consolidation. Electronically Signed   By: Maurine Simmering M.D.   On: 11/03/2022 13:30       Jacquelinne Speak M.D. [facility] 11/04/2022, 1:41 PM  Available via Epic secure chat 7am-7pm After 7 pm, please refer to night coverage provider listed on amion.

## 2022-11-04 NOTE — Consult Note (Signed)
NAME:  Franklin Chavez, MRN:  [id number], DOB:  07-06-1961, LOS: 4 ADMISSION DATE:  10/30/2022, CONSULTATION DATE:  11/03/2022 REFERRING MD:  Dr. Tana Coast, CHIEF COMPLAINT:  Dyspnea. AECOPD   History of Present Illness:  61 year old male with recent admission 11/20-11/23 with COPD exacerbation. Presented at that time with productive cough, fever, chills, and night sweats for 2 weeks. CXR with no acute. Treated with inhalers and steroids. Discharged with no oxygen requirements. Presents back to ED 11/25 with shortness of breath. CTA negative for PE, noted emphysema. Treated with nebs and solu-medrol. Presents 11/27 with ongoing shortness of breath and wheezing with reported runny nose and productive cough with white phlegm. On arrival oxygen saturation 90%, placed on 4L [place]. Given solu-medrol, IV mag, and nebs, and rocephin. 12/1 pulmonary consulted given ongoing dyspnea.   Pertinent  Medical History  COPD, HTN, Tobacco use disorder (20 pack-year-history), homelessness   Significant Hospital Events: Including procedures, antibiotic start and stop dates in addition to other pertinent events   11/27 > Admitted  Interim History / Subjective:   Extended respiratory viral panel is positive for Flu A He is feeling a little better.  Objective   Blood pressure (!) 154/102, pulse 72, temperature 98.9 F (37.2 C), resp. rate 20, height 6' (1.829 m), weight 77 kg, SpO2 98 %.        Intake/Output Summary (Last 24 hours) at 11/04/2022 0804 Last data filed at 11/03/2022 1945 Gross per 24 hour  Intake 240 ml  Output 750 ml  Net -510 ml   Filed Weights   11/01/22 1900  Weight: 77 kg    Examination: General: no acute distress, lying in bed  HENT: nasal cannula in place, dry MM  Lungs: no wheezing, diminished breath sounds throughout Cardiovascular: rrr Abdomen: non-distended Extremities:  no edema, warm Neuro: alert, oriented, moving all extremities GU: deferred   Resolved Hospital Problem list      Assessment & Plan:   Acute Hypoxic Respiratory Failure with presumed AECOPD Influenza A infection -Flu A positive -CTA Chest 11/26 with advanced emphysema  Plan - Titrate Supplemental oxygen for saturation goal >88 - Encourage good pulmonary hygiene  - start tamiflu therapy twice daily for 5 days - continue solumedrol 40mg  daily, will decrease from BID dosing. Will plan for extended steroid taper with prednisone - Echo without concern for pulmonary hypertension - Continue yupelri, brovana and budesonide  PCCM will continue to follow  Best Practice (right click and "Reselect all SmartList Selections" daily)   Per primary team   Labs   CBC: Recent Labs  Lab 10/28/22 2206 10/30/22 1225 11/01/22 0545 11/03/22 0726  WBC 15.4* 14.5* 12.9* 11.8*  NEUTROABS 11.1*  --   --   --   HGB 14.4 15.1 15.1 15.1  HCT 42.3 46.0 45.6 46.3  MCV 83.9 86.1 85.4 87.9  PLT 311 304 313 [id number]    Basic Metabolic Panel: Recent Labs  Lab 10/28/22 2206 10/30/22 1225 11/01/22 0545 11/03/22 0726 11/04/22 0528  NA 138 140 136 138 139  K 3.8 4.1 4.9 3.9 4.4  CL 101 102 95* 96* 96*  CO2 30 32 31 38* 36*  GLUCOSE 112* 107* 132* 94 130*  BUN 18 19 18 16 12   CREATININE 0.88 0.69 0.88 0.84 0.77  CALCIUM 8.4* 8.5* 9.0 8.9 8.8*  MG  --   --  2.2  --   --   PHOS  --   --  4.2  --   --  GFR: Estimated Creatinine Clearance: 106.9 mL/min (by C-G formula based on SCr of 0.77 mg/dL). Recent Labs  Lab 10/28/22 2206 10/30/22 1225 11/01/22 0545 11/03/22 0726  WBC 15.4* 14.5* 12.9* 11.8*    Liver Function Tests: Recent Labs  Lab 10/28/22 2206 11/01/22 0545  AST 25  --   ALT 40  --   ALKPHOS 40  --   BILITOT 0.5  --   PROT 6.3*  --   ALBUMIN 3.4* 3.2*   No results for input(s): "LIPASE", "AMYLASE" in the last 168 hours. No results for input(s): "AMMONIA" in the last 168 hours.  ABG    Component Value Date/Time   HCO3 35.2 (H) 10/28/2022 2206   O2SAT 89.1 10/28/2022 2206      Coagulation Profile: No results for input(s): "INR", "PROTIME" in the last 168 hours.  Cardiac Enzymes: No results for input(s): "CKTOTAL", "CKMB", "CKMBINDEX", "TROPONINI" in the last 168 hours.  HbA1C: No results found for: "HGBA1C"  CBG: No results for input(s): "GLUCAP" in the last 168 hours.   Critical Care Time: n/a    Melody Comas, MD [facility] Office: [phone]   See Amion for personal pager PCCM on call pager [phone] until 7pm. Please call Elink 7p-7a. [phone]

## 2022-11-05 ENCOUNTER — Telehealth: Payer: Self-pay | Admitting: Pulmonary Disease

## 2022-11-05 DIAGNOSIS — J101 Influenza due to other identified influenza virus with other respiratory manifestations: Secondary | ICD-10-CM

## 2022-11-05 DIAGNOSIS — J441 Chronic obstructive pulmonary disease with (acute) exacerbation: Secondary | ICD-10-CM | POA: Diagnosis not present

## 2022-11-05 DIAGNOSIS — E440 Moderate protein-calorie malnutrition: Secondary | ICD-10-CM | POA: Diagnosis not present

## 2022-11-05 DIAGNOSIS — F17200 Nicotine dependence, unspecified, uncomplicated: Secondary | ICD-10-CM | POA: Diagnosis not present

## 2022-11-05 DIAGNOSIS — Z5900 Homelessness unspecified: Secondary | ICD-10-CM | POA: Diagnosis not present

## 2022-11-05 MED ORDER — PREDNISONE 20 MG PO TABS
40.0000 mg | ORAL_TABLET | Freq: Every day | ORAL | Status: DC
  Administered 2022-11-05 – 2022-11-07 (×3): 40 mg via ORAL
  Filled 2022-11-05 (×3): qty 2

## 2022-11-05 MED ORDER — PREDNISONE 20 MG PO TABS: 30.0000 mg | ORAL_TABLET | Freq: Every day | ORAL | Status: DC

## 2022-11-05 MED ORDER — PREDNISONE 20 MG PO TABS: 20.0000 mg | ORAL_TABLET | Freq: Every day | ORAL | Status: DC

## 2022-11-05 MED ORDER — PREDNISONE 10 MG PO TABS: 10.0000 mg | ORAL_TABLET | Freq: Every day | ORAL | Status: DC

## 2022-11-05 NOTE — Telephone Encounter (Signed)
Please schedule hospital follow up for COPD exacerbation in 2-4 weeks.  Thanks, JD

## 2022-11-05 NOTE — Progress Notes (Signed)
[facility]                                                                              Leon Montoya, is a 61 y.o. male, DOB - 1961-10-15, [id number] Admit date - 10/30/2022    Outpatient Primary MD for the patient is Patient, No Pcp Per  LOS - 5  days  Chief Complaint  Patient presents with   Asthma       Brief summary   61 y.o. male with COPD, HTN, tobacco use disorder and recent hospitalization for COPD exacerbation returning with shortness of breath, productive cough, wheezing and chest tightness and admitted for COPD exacerbation.    Hospitalized 11/20-11/23 for COPD exacerbation and discharged on albuterol inhaler as needed.  He returned to ED 11/25.work-up including CTA chest was negative except for emphysema.  He was discharged on p.o. prednisone and albuterol inhaler.    Assessment & Plan    Principal Problem:   COPD with acute exacerbation (HCC), dyspnea with minimal exertion -Presented with shortness of breath, chest tightness, wheezing and productive cough -CTA chest negative for PE or pneumonia.  CXR with hyperinflation suggesting COPD, emphysema -COVID-19 negative. -Troponins negative, EKG without any ischemia -Appreciate pulmonology commendations, continue Roxy Manns, budesonide inpatient.  Outpatient will need Trelegy or Breztri versus a combination of Advair and Spiriva -Will need outpatient follow-up with pulmonology -Troponins 14-10, 2D echo showed EF 60-65%, G2 DD  Active Problems: Influenza A positive -Respiratory virus panel shows influenza A positive, started on Tamiflu on 12/2 x 5 days -Continue droplet precautions    Tobacco use disorder -Counseled on smoking cessation    Uncontrolled hypertension -BP improved, continue Norvasc, losartan, Imdur     Homelessness -TOC consulted, appreciate assistance    Malnutrition of moderate degree - Nutrition Problem: Moderate Malnutrition Etiology: chronic illness  (COPD) Signs/Symptoms: moderate fat depletion, severe muscle depletion Estimated body mass index is 23.02 kg/m as calculated from the following:   Height as of this encounter: 6' (1.829 m).   Weight as of this encounter: 77 kg.  Code Status: Full code DVT Prophylaxis:  enoxaparin (LOVENOX) injection 40 mg Start: 10/30/22 2200   Level of Care: Level of care: Med-Surg Family Communication:  Disposition Plan:      Remains inpatient appropriate: Hopefully DC home in a.m. if clinically improving Procedures:  2D echo Consultants:   Pulmonology  Antimicrobials:   Anti-infectives (From admission, onward)    Start     Dose/Rate Route Frequency Ordered Stop   11/04/22 2200  oseltamivir (TAMIFLU) capsule 75 mg        75 mg Oral 2 times daily 11/04/22 1817 11/09/22 2159   11/04/22 1000  azithromycin (ZITHROMAX) tablet 250 mg       See Hyperspace for full Linked Orders Report.   250 mg Oral Daily 11/03/22 1540 11/08/22 0959   11/03/22 1630  azithromycin (ZITHROMAX) tablet 500 mg       See Hyperspace for full Linked Orders Report.   500 mg Oral Daily 11/03/22 1540 11/03/22 1617   10/30/22 1615  cefTRIAXone (ROCEPHIN) 1 g in sodium chloride 0.9 % 100 mL IVPB  1 g 200 mL/hr over 30 Minutes Intravenous Every 24 hours 10/30/22 1610 11/03/22 1752          Medications  amLODipine  10 mg Oral Daily   arformoterol  15 mcg Nebulization BID   azithromycin  250 mg Oral Daily   benzonatate  100 mg Oral TID   budesonide (PULMICORT) nebulizer solution  0.25 mg Nebulization BID   busPIRone  5 mg Oral Daily   enoxaparin (LOVENOX) injection  40 mg Subcutaneous Q24H   feeding supplement  237 mL Oral BID BM   guaiFENesin  600 mg Oral BID   isosorbide mononitrate  60 mg Oral Daily   losartan  100 mg Oral Daily   multivitamin with minerals  1 tablet Oral Daily   oseltamivir  75 mg Oral BID   predniSONE  40 mg Oral Q breakfast   Followed by   Melene Muller[START ON 11/09/2022] predniSONE  30 mg Oral Q  breakfast   Followed by   Melene Muller[START ON 11/13/2022] predniSONE  20 mg Oral Q breakfast   Followed by   Melene Muller[START ON 11/17/2022] predniSONE  10 mg Oral Q breakfast   revefenacin  175 mcg Nebulization Daily      Subjective:   Claudette LawsDonald Cregan was seen and examined today.  Feeling better, no acute fevers or chills, coughing, wheezing improving.    Objective:   Vitals:   11/05/22 0541 11/05/22 0728 11/05/22 0854 11/05/22 1213  BP: (!) 144/87   125/79  Pulse: 79   79  Resp: 19   20  Temp: 98.3 F (36.8 C)   99 F (37.2 C)  TempSrc: Oral   Oral  SpO2: 96% 100% 91% 93%  Weight:      Height:        Intake/Output Summary (Last 24 hours) at 11/05/2022 1249 Last data filed at 11/05/2022 0929 Gross per 24 hour  Intake 1000 ml  Output 1750 ml  Net -750 ml     Wt Readings from Last 3 Encounters:  11/01/22 77 kg  10/28/22 77 kg  10/23/22 77.1 kg    Physical Exam General: Alert and oriented x 3, NAD Cardiovascular: S1 S2 clear, RRR.  Respiratory: Improved air movement, fairly CTA today Gastrointestinal: Soft, nontender, nondistended, NBS Ext: no pedal edema bilaterally Neuro: no new deficits Psych: flat affect    Data Reviewed:  I have personally reviewed following labs    CBC Lab Results  Component Value Date   WBC 11.8 (H) 11/03/2022   RBC 5.27 11/03/2022   HGB 15.1 11/03/2022   HCT 46.3 11/03/2022   MCV 87.9 11/03/2022   MCH 28.7 11/03/2022   PLT 290 11/03/2022   MCHC 32.6 11/03/2022   RDW 13.7 11/03/2022   LYMPHSABS 1.7 10/28/2022   MONOABS 0.9 10/28/2022   EOSABS 1.6 (H) 10/28/2022   BASOSABS 0.1 10/28/2022     Last metabolic panel Lab Results  Component Value Date   NA 139 11/04/2022   K 4.4 11/04/2022   CL 96 (L) 11/04/2022   CO2 36 (H) 11/04/2022   BUN 12 11/04/2022   CREATININE 0.77 11/04/2022   GLUCOSE 130 (H) 11/04/2022   GFRNONAA >60 11/04/2022   GFRAA >60 12/10/2018   CALCIUM 8.8 (L) 11/04/2022   PHOS 4.2 11/01/2022   PROT 6.3 (L)  10/28/2022   ALBUMIN 3.2 (L) 11/01/2022   BILITOT 0.5 10/28/2022   ALKPHOS 40 10/28/2022   AST 25 10/28/2022   ALT 40 10/28/2022   ANIONGAP 7 11/04/2022  CBG (last 3)  No results for input(s): "GLUCAP" in the last 72 hours.    Coagulation Profile: No results for input(s): "INR", "PROTIME" in the last 168 hours.   Radiology Studies: I have personally reviewed the imaging studies  ECHOCARDIOGRAM COMPLETE  Result Date: 11/03/2022    ECHOCARDIOGRAM REPORT   Patient Name:   RIEL HIRSCHMAN Date of Exam: 11/03/2022 Medical Rec #:  [id number]       Height:       72.0 in Accession #:    [id number]      Weight:       169.8 lb Date of Birth:  01-Aug-1961       BSA:          1.987 m Patient Age:    61 years        BP:           143/85 mmHg Patient Gender: M               HR:           87 bpm. Exam Location:  Inpatient Procedure: 2D Echo, Color Doppler and Cardiac Doppler Indications:    Pulmonary hypertension  History:        Patient has no prior history of Echocardiogram examinations.                 COPD, Signs/Symptoms:Shortness of Breath; Risk                 Factors:Hypertension and Current Smoker.  Sonographer:    Milda Smart Referring Phys: [id number] Tobey Grim  Sonographer Comments: Suboptimal parasternal window. Image acquisition challenging due to patient body habitus and Image acquisition challenging due to respiratory motion. IMPRESSIONS  1. Left ventricular ejection fraction, by estimation, is 60 to 65%. The left ventricle has normal function. The left ventricle has no regional wall motion abnormalities. There is moderate concentric left ventricular hypertrophy. Left ventricular diastolic parameters are consistent with Grade II diastolic dysfunction (pseudonormalization).  2. Right ventricular systolic function is normal. The right ventricular size is normal.  3. The mitral valve is normal in structure. No evidence of mitral valve regurgitation. No evidence of mitral stenosis.  4. The  aortic valve is normal in structure. Aortic valve regurgitation is not visualized. No aortic stenosis is present.  5. The inferior vena cava is normal in size with greater than 50% respiratory variability, suggesting right atrial pressure of 3 mmHg. Comparison(s): No prior Echocardiogram. FINDINGS  Left Ventricle: Left ventricular ejection fraction, by estimation, is 60 to 65%. The left ventricle has normal function. The left ventricle has no regional wall motion abnormalities. The left ventricular internal cavity size was normal in size. There is  moderate concentric left ventricular hypertrophy. Left ventricular diastolic parameters are consistent with Grade II diastolic dysfunction (pseudonormalization). Right Ventricle: The right ventricular size is normal. Right ventricular systolic function is normal. Left Atrium: Left atrial size was normal in size. Right Atrium: Right atrial size was normal in size. Pericardium: There is no evidence of pericardial effusion. Mitral Valve: The mitral valve is normal in structure. No evidence of mitral valve regurgitation. No evidence of mitral valve stenosis. Tricuspid Valve: The tricuspid valve is normal in structure. Tricuspid valve regurgitation is not demonstrated. No evidence of tricuspid stenosis. Aortic Valve: The aortic valve is normal in structure. Aortic valve regurgitation is not visualized. No aortic stenosis is present. Pulmonic Valve: The pulmonic valve was normal in structure. Pulmonic valve regurgitation is not  visualized. No evidence of pulmonic stenosis. Aorta: The aortic root is normal in size and structure. Venous: The inferior vena cava is normal in size with greater than 50% respiratory variability, suggesting right atrial pressure of 3 mmHg. IAS/Shunts: No atrial level shunt detected by color flow Doppler.  LEFT VENTRICLE PLAX 2D LVIDd:         3.70 cm      Diastology LVIDs:         2.40 cm      LV e' medial:    5.98 cm/s LV PW:         1.20 cm      LV  E/e' medial:  10.3 LV IVS:        1.10 cm      LV e' lateral:   11.50 cm/s                             LV E/e' lateral: 5.3  LV Volumes (MOD) LV vol d, MOD A2C: 85.7 ml LV vol d, MOD A4C: 100.0 ml LV vol s, MOD A2C: 30.0 ml LV vol s, MOD A4C: 26.7 ml LV SV MOD A2C:     55.7 ml LV SV MOD A4C:     100.0 ml LV SV MOD BP:      68.5 ml RIGHT VENTRICLE RV S prime:     14.80 cm/s TAPSE (M-mode): 2.0 cm LEFT ATRIUM             Index        RIGHT ATRIUM           Index LA diam:        3.10 cm 1.56 cm/m   RA Area:     13.10 cm LA Vol (A2C):   68.1 ml 34.27 ml/m  RA Volume:   29.00 ml  14.59 ml/m LA Vol (A4C):   24.7 ml 12.43 ml/m LA Biplane Vol: 44.1 ml 22.19 ml/m  AORTIC VALVE             PULMONIC VALVE LVOT Vmax:   122.00 cm/s PV Vmax:       1.53 m/s LVOT Vmean:  83.900 cm/s PV Vmean:      110.000 cm/s LVOT VTI:    0.180 m     PV VTI:        0.261 m                          PV Peak grad:  9.4 mmHg                          PV Mean grad:  5.0 mmHg  MITRAL VALVE MV Area (PHT): 3.03 cm    SHUNTS MV Decel Time: 250 msec    Systemic VTI: 0.18 m MV E velocity: 61.40 cm/s MV A velocity: 47.60 cm/s MV E/A ratio:  1.29 Olga Millers MD Electronically signed by Olga Millers MD Signature Date/Time: 11/03/2022/3:50:40 PM    Final    DG CHEST PORT 1 VIEW  Result Date: 11/03/2022 CLINICAL DATA:  Hypoxia EXAM: PORTABLE CHEST 1 VIEW COMPARISON:  Radiograph 10/30/2022 FINDINGS: Unchanged cardiomediastinal silhouette. There is no focal airspace consolidation. Pulmonary hyperinflation with apical predominant emphysema. There is no pleural effusion or evidence of pneumothorax. No acute osseous abnormality. IMPRESSION: Findings of COPD.  No focal airspace consolidation. Electronically Signed   By: Gerilyn Pilgrim  Park Breed M.D.   On: 11/03/2022 13:30       Nilsa Macht M.D. [facility] 11/05/2022, 12:49 PM  Available via Epic secure chat 7am-7pm After 7 pm, please refer to night coverage provider listed on amion.

## 2022-11-05 NOTE — Consult Note (Signed)
NAME:  Franklin Chavez, MRN:  [id number], DOB:  [birth year], LOS: 5 ADMISSION DATE:  10/30/2022, CONSULTATION DATE:  11/03/2022 REFERRING MD:  Dr. Isidoro Donning, CHIEF COMPLAINT:  Dyspnea. AECOPD   History of Present Illness:  61 year old male with recent admission 11/20-11/23 with COPD exacerbation. Presented at that time with productive cough, fever, chills, and night sweats for 2 weeks. CXR with no acute. Treated with inhalers and steroids. Discharged with no oxygen requirements. Presents back to ED 11/25 with shortness of breath. CTA negative for PE, noted emphysema. Treated with nebs and solu-medrol. Presents 11/27 with ongoing shortness of breath and wheezing with reported runny nose and productive cough with white phlegm. On arrival oxygen saturation 90%, placed on 4L [place]. Given solu-medrol, IV mag, and nebs, and rocephin. 12/1 pulmonary consulted given ongoing dyspnea.   Pertinent  Medical History  COPD, HTN, Tobacco use disorder (20 pack-year-history), homelessness   Significant Hospital Events: Including procedures, antibiotic start and stop dates in addition to other pertinent events   11/27 > Admitted  Interim History / Subjective:   His breathing feels a bit better today  Objective   Blood pressure 125/79, pulse 79, temperature 99 F (37.2 C), temperature source Oral, resp. rate 20, height 6' (1.829 m), weight 77 kg, SpO2 93 %.        Intake/Output Summary (Last 24 hours) at 11/05/2022 1218 Last data filed at 11/05/2022 3785 Gross per 24 hour  Intake 1000 ml  Output 1750 ml  Net -750 ml   Filed Weights   11/01/22 1900  Weight: 77 kg    Examination: General: no acute distress, lying in bed  HENT: nasal cannula in place, dry MM  Lungs: no wheezing, improved air movement Cardiovascular: rrr Abdomen: non-distended Extremities:  no edema, warm Neuro: alert, oriented, moving all extremities GU: deferred   Resolved Hospital Problem list     Assessment & Plan:   Acute  Hypoxic Respiratory Failure with presumed AECOPD Influenza A infection -Flu A positive -CTA Chest 11/26 with advanced emphysema  Plan - Titrate Supplemental oxygen for saturation goal >88 - Encourage good pulmonary hygiene  - start tamiflu therapy twice daily for 5 days (started 12/2) - Prednisone taper entered for 16 day taper - Echo without concern for pulmonary hypertension - Continue yupelri, brovana and budesonide. Will need ICS/LAMA/LABA inhalers at time of discharge. This could be Trelegy or Breztri vs a combination of advair + spiriva.  - Recommend follow up with Dr. Delford Field in the community clinic  - Will schedule follow up in pulmonary clinic in 2-4 weeks  PCCM will sign off.   Best Practice (right click and "Reselect all SmartList Selections" daily)   Per primary team   Labs   CBC: Recent Labs  Lab 10/30/22 1225 11/01/22 0545 11/03/22 0726  WBC 14.5* 12.9* 11.8*  HGB 15.1 15.1 15.1  HCT 46.0 45.6 46.3  MCV 86.1 85.4 87.9  PLT 304 313 290    Basic Metabolic Panel: Recent Labs  Lab 10/30/22 1225 11/01/22 0545 11/03/22 0726 11/04/22 0528  NA 140 136 138 139  K 4.1 4.9 3.9 4.4  CL 102 95* 96* 96*  CO2 32 31 38* 36*  GLUCOSE 107* 132* 94 130*  BUN 19 18 16 12   CREATININE 0.69 0.88 0.84 0.77  CALCIUM 8.5* 9.0 8.9 8.8*  MG  --  2.2  --   --   PHOS  --  4.2  --   --    GFR: Estimated  Creatinine Clearance: 106.9 mL/min (by C-G formula based on SCr of 0.77 mg/dL). Recent Labs  Lab 10/30/22 1225 11/01/22 0545 11/03/22 0726  WBC 14.5* 12.9* 11.8*    Liver Function Tests: Recent Labs  Lab 11/01/22 0545  ALBUMIN 3.2*   No results for input(s): "LIPASE", "AMYLASE" in the last 168 hours. No results for input(s): "AMMONIA" in the last 168 hours.  ABG    Component Value Date/Time   HCO3 35.2 (H) 10/28/2022 2206   O2SAT 89.1 10/28/2022 2206     Coagulation Profile: No results for input(s): "INR", "PROTIME" in the last 168 hours.  Cardiac  Enzymes: No results for input(s): "CKTOTAL", "CKMB", "CKMBINDEX", "TROPONINI" in the last 168 hours.  HbA1C: No results found for: "HGBA1C"  CBG: No results for input(s): "GLUCAP" in the last 168 hours.   Critical Care Time: n/a    Melody Comas, MD [facility] Office: [phone]   See Amion for personal pager PCCM on call pager [phone] until 7pm. Please call Elink 7p-7a. [phone]

## 2022-11-06 ENCOUNTER — Other Ambulatory Visit (HOSPITAL_COMMUNITY): Payer: Self-pay

## 2022-11-06 DIAGNOSIS — E440 Moderate protein-calorie malnutrition: Secondary | ICD-10-CM | POA: Diagnosis not present

## 2022-11-06 DIAGNOSIS — J441 Chronic obstructive pulmonary disease with (acute) exacerbation: Secondary | ICD-10-CM | POA: Diagnosis not present

## 2022-11-06 DIAGNOSIS — F17200 Nicotine dependence, unspecified, uncomplicated: Secondary | ICD-10-CM | POA: Diagnosis not present

## 2022-11-06 DIAGNOSIS — Z5900 Homelessness unspecified: Secondary | ICD-10-CM | POA: Diagnosis not present

## 2022-11-06 LAB — CREATININE, SERUM
Creatinine, Ser: 0.74 mg/dL (ref 0.61–1.24)
GFR, Estimated: >60 mL/min (ref >60)

## 2022-11-06 NOTE — Telephone Encounter (Signed)
Patient scheduled for 12/18 at 2pm with Rubye Oaks, NP. Reminder mailed to address on file.

## 2022-11-06 NOTE — Progress Notes (Signed)
[facility]                                                                              [name], is a 61 y.o. male, DOB - [birth year], NWG:[id number] Admit date - 10/30/2022    Outpatient Primary MD for the Franklin Chavez is Franklin Chavez, No Pcp Per  LOS - 6  days  Chief Complaint  Franklin Chavez presents with   Asthma       Brief summary   61 y.o. male with COPD, HTN, tobacco use disorder and recent hospitalization for COPD exacerbation returning with shortness of breath, productive cough, wheezing and chest tightness and admitted for COPD exacerbation.    Hospitalized 11/20-11/23 for COPD exacerbation and discharged on albuterol inhaler as needed.  He returned to ED 11/25.work-up including CTA chest was negative except for emphysema.  He was discharged on p.o. prednisone and albuterol inhaler.    Assessment & Plan    Principal Problem:   COPD with acute exacerbation (HCC), dyspnea with minimal exertion -Presented with shortness of breath, chest tightness, wheezing and productive cough -CTA chest negative for PE or pneumonia.  CXR with hyperinflation suggesting COPD, emphysema -COVID-19 negative. -Troponins negative, EKG without any ischemia -Appreciate pulmonology commendations, continue Roxy Manns, budesonide inpatient.  Outpatient will need Trelegy or Breztri versus a combination of Advair and Spiriva -Will need outpatient follow-up with pulmonology -Troponins 14-10, 2D echo showed EF 60-65%, G2 DD -Home O2 evaluation done, does not qualify for O2.  Franklin Chavez reported that he became short of breath and dizzy while ambulating.  Active Problems: Influenza A positive -Respiratory virus panel shows influenza A positive -Continue Tamiflu, started on 12/2 for 5 days -Continue droplet precautions    Tobacco use disorder -Counseled on smoking cessation    Uncontrolled hypertension -Continue Norvasc, losartan, Imdur     Homelessness -TOC consulted, appreciate  assistance    Malnutrition of moderate degree - Nutrition Problem: Moderate Malnutrition Etiology: chronic illness (COPD) Signs/Symptoms: moderate fat depletion, severe muscle depletion Estimated body mass index is 23.02 kg/m as calculated from the following:   Height as of this encounter: 6' (1.829 m).   Weight as of this encounter: 77 kg.  Code Status: Full code DVT Prophylaxis:  enoxaparin (LOVENOX) injection 40 mg Start: 10/30/22 2200   Level of Care: Level of care: Med-Surg Family Communication:  Disposition Plan:      Remains inpatient appropriate: Possible DC in a.m. Procedures:  2D echo Consultants:   Pulmonology  Antimicrobials:   Anti-infectives (From admission, onward)    Start     Dose/Rate Route Frequency Ordered Stop   11/04/22 2200  oseltamivir (TAMIFLU) capsule 75 mg        75 mg Oral 2 times daily 11/04/22 1817 11/09/22 2159   11/04/22 1000  azithromycin (ZITHROMAX) tablet 250 mg       See Hyperspace for full Linked Orders Report.   250 mg Oral Daily 11/03/22 1540 11/08/22 0959   11/03/22 1630  azithromycin (ZITHROMAX) tablet 500 mg       See Hyperspace for full Linked Orders Report.   500 mg Oral Daily 11/03/22 1540 11/03/22 1617  10/30/22 1615  cefTRIAXone (ROCEPHIN) 1 g in sodium chloride 0.9 % 100 mL IVPB        1 g 200 mL/hr over 30 Minutes Intravenous Every 24 hours 10/30/22 1610 11/03/22 1752          Medications  amLODipine  10 mg Oral Daily   arformoterol  15 mcg Nebulization BID   azithromycin  250 mg Oral Daily   benzonatate  100 mg Oral TID   budesonide (PULMICORT) nebulizer solution  0.25 mg Nebulization BID   busPIRone  5 mg Oral Daily   enoxaparin (LOVENOX) injection  40 mg Subcutaneous Q24H   feeding supplement  237 mL Oral BID BM   guaiFENesin  600 mg Oral BID   isosorbide mononitrate  60 mg Oral Daily   losartan  100 mg Oral Daily   multivitamin with minerals  1 tablet Oral Daily   oseltamivir  75 mg Oral BID   predniSONE   40 mg Oral Q breakfast   Followed by   Melene Muller ON 11/09/2022] predniSONE  30 mg Oral Q breakfast   Followed by   Melene Muller ON 11/13/2022] predniSONE  20 mg Oral Q breakfast   Followed by   Melene Muller ON 11/17/2022] predniSONE  10 mg Oral Q breakfast   revefenacin  175 mcg Nebulization Daily      Subjective:   Franklin Chavez was seen and examined today.  Overall improving, no acute wheezing, no fevers or chills.  States he felt short of breath, dizzy when ambulating for home O2 evaluation   Objective:   Vitals:   11/05/22 2141 11/06/22 0553 11/06/22 0836 11/06/22 0929  BP: 126/83 (!) 158/94    Pulse: 71 (!) 56    Resp: 17 18    Temp: 98.8 F (37.1 C) 98.9 F (37.2 C)    TempSrc:  Oral    SpO2: 94% 99% 94% 93%  Weight:      Height:        Intake/Output Summary (Last 24 hours) at 11/06/2022 1346 Last data filed at 11/06/2022 0947 Gross per 24 hour  Intake 709 ml  Output 950 ml  Net -241 ml     Wt Readings from Last 3 Encounters:  11/01/22 77 kg  10/28/22 77 kg  10/23/22 77.1 kg   Physical Exam General: Alert and oriented x 3, NAD Cardiovascular: S1 S2 clear, RRR.  Respiratory: CTAB, no wheezing Gastrointestinal: Soft, nontender, nondistended, NBS Ext: no pedal edema bilaterally Neuro: no new deficits Psych: Normal affect    Data Reviewed:  I have personally reviewed following labs    CBC Lab Results  Component Value Date   WBC 11.8 (H) 11/03/2022   RBC 5.27 11/03/2022   HGB 15.1 11/03/2022   HCT 46.3 11/03/2022   MCV 87.9 11/03/2022   MCH 28.7 11/03/2022   PLT 290 11/03/2022   MCHC 32.6 11/03/2022   RDW 13.7 11/03/2022   LYMPHSABS 1.7 10/28/2022   MONOABS 0.9 10/28/2022   EOSABS 1.6 (H) 10/28/2022   BASOSABS 0.1 10/28/2022     Last metabolic panel Lab Results  Component Value Date   NA 139 11/04/2022   K 4.4 11/04/2022   CL 96 (L) 11/04/2022   CO2 36 (H) 11/04/2022   BUN 12 11/04/2022   CREATININE 0.74 11/06/2022   GLUCOSE 130 (H) 11/04/2022    GFRNONAA >60 11/06/2022   GFRAA >60 12/10/2018   CALCIUM 8.8 (L) 11/04/2022   PHOS 4.2 11/01/2022   PROT 6.3 (L) 10/28/2022  ALBUMIN 3.2 (L) 11/01/2022   BILITOT 0.5 10/28/2022   ALKPHOS 40 10/28/2022   AST 25 10/28/2022   ALT 40 10/28/2022   ANIONGAP 7 11/04/2022    CBG (last 3)  No results for input(s): "GLUCAP" in the last 72 hours.    Coagulation Profile: No results for input(s): "INR", "PROTIME" in the last 168 hours.   Radiology Studies: I have personally reviewed the imaging studies  No results found.     Thad Ranger M.D. [facility] 11/06/2022, 1:46 PM  Available via Epic secure chat 7am-7pm After 7 pm, please refer to night coverage provider listed on amion.

## 2022-11-06 NOTE — Progress Notes (Signed)
SATURATION QUALIFICATIONS: (This note is used to comply with regulatory documentation for home oxygen)  Patient Saturations on Room Air at Rest = 95%  Patient Saturations on Room Air while Ambulating = 94%   

## 2022-11-07 ENCOUNTER — Other Ambulatory Visit (HOSPITAL_COMMUNITY): Payer: Self-pay

## 2022-11-07 DIAGNOSIS — I10 Essential (primary) hypertension: Secondary | ICD-10-CM | POA: Diagnosis not present

## 2022-11-07 DIAGNOSIS — F17200 Nicotine dependence, unspecified, uncomplicated: Secondary | ICD-10-CM | POA: Diagnosis not present

## 2022-11-07 DIAGNOSIS — Z5900 Homelessness unspecified: Secondary | ICD-10-CM | POA: Diagnosis not present

## 2022-11-07 DIAGNOSIS — J441 Chronic obstructive pulmonary disease with (acute) exacerbation: Secondary | ICD-10-CM | POA: Diagnosis not present

## 2022-11-07 MED ORDER — ISOSORBIDE MONONITRATE ER 60 MG PO TB24
60.0000 mg | ORAL_TABLET | Freq: Every day | ORAL | 3 refills | Status: DC
  Filled 2022-11-07: qty 30, 30d supply, fill #0

## 2022-11-07 MED ORDER — ORAL CARE MOUTH RINSE: 15.0000 mL | OROMUCOSAL | Status: DC | PRN

## 2022-11-07 MED ORDER — FLUTICASONE-SALMETEROL 230-21 MCG/ACT IN AERO
2.0000 | INHALATION_SPRAY | Freq: Two times a day (BID) | RESPIRATORY_TRACT | 0 refills | Status: DC
  Filled 2022-11-07: qty 12, 30d supply, fill #0

## 2022-11-07 MED ORDER — PREDNISONE 10 MG PO TABS
ORAL_TABLET | ORAL | 0 refills | Status: DC
  Filled 2022-11-07: qty 40, 16d supply, fill #0

## 2022-11-07 MED ORDER — GUAIFENESIN 400 MG PO TABS
600.0000 mg | ORAL_TABLET | Freq: Two times a day (BID) | ORAL | 0 refills | Status: DC
  Filled 2022-11-07: qty 45, 15d supply, fill #0

## 2022-11-07 MED ORDER — FLUTICASONE-SALMETEROL 230-21 MCG/ACT IN AERO
2.0000 | INHALATION_SPRAY | Freq: Two times a day (BID) | RESPIRATORY_TRACT | 1 refills | Status: DC
  Filled 2022-11-07: qty 12, 30d supply, fill #0

## 2022-11-07 MED ORDER — BUSPIRONE HCL 5 MG PO TABS
5.0000 mg | ORAL_TABLET | Freq: Two times a day (BID) | ORAL | 0 refills | Status: AC
  Filled 2022-11-07: qty 60, 30d supply, fill #0

## 2022-11-07 MED ORDER — ALBUTEROL SULFATE HFA 108 (90 BASE) MCG/ACT IN AERS
1.0000 | INHALATION_SPRAY | Freq: Four times a day (QID) | RESPIRATORY_TRACT | 1 refills | Status: DC | PRN
  Filled 2022-11-07: qty 6.7, 30d supply, fill #0

## 2022-11-07 MED ORDER — OSELTAMIVIR PHOSPHATE 75 MG PO CAPS
75.0000 mg | ORAL_CAPSULE | Freq: Two times a day (BID) | ORAL | 0 refills | Status: AC
  Filled 2022-11-07: qty 4, 2d supply, fill #0

## 2022-11-07 MED ORDER — FLUTICASONE-SALMETEROL 250-50 MCG/ACT IN AEPB
1.0000 | INHALATION_SPRAY | Freq: Two times a day (BID) | RESPIRATORY_TRACT | 3 refills | Status: DC
  Filled 2022-11-07: qty 60, 30d supply, fill #0

## 2022-11-07 MED ORDER — LOSARTAN POTASSIUM 100 MG PO TABS
100.0000 mg | ORAL_TABLET | Freq: Every day | ORAL | 3 refills | Status: DC
  Filled 2022-11-07: qty 30, 30d supply, fill #0

## 2022-11-07 MED ORDER — ALBUTEROL SULFATE HFA 108 (90 BASE) MCG/ACT IN AERS
1.0000 | INHALATION_SPRAY | Freq: Four times a day (QID) | RESPIRATORY_TRACT | 1 refills | Status: DC | PRN
  Filled 2022-11-07: qty 18, 25d supply, fill #0

## 2022-11-07 MED ORDER — TIOTROPIUM BROMIDE MONOHYDRATE 18 MCG IN CAPS
18.0000 ug | ORAL_CAPSULE | Freq: Every day | RESPIRATORY_TRACT | 2 refills | Status: DC
  Filled 2022-11-07: qty 30, 30d supply, fill #0

## 2022-11-07 MED ORDER — BENZONATATE 100 MG PO CAPS
100.0000 mg | ORAL_CAPSULE | Freq: Three times a day (TID) | ORAL | 0 refills | Status: DC | PRN
  Filled 2022-11-07: qty 30, 10d supply, fill #0

## 2022-11-07 MED ORDER — AMLODIPINE BESYLATE 10 MG PO TABS
10.0000 mg | ORAL_TABLET | Freq: Every day | ORAL | 3 refills | Status: DC
  Filled 2022-11-07: qty 30, 30d supply, fill #0

## 2022-11-07 NOTE — Discharge Summary (Addendum)
Physician Discharge Summary   Patient: Franklin Chavez MRN: [id number] DOB: January 06, 1961  Admit date:     10/30/2022  Discharge date: 11/07/22  Discharge Physician: Estill Cotta, MD    PCP: Patient, No Pcp Per   Recommendations at discharge:   Continue Tamiflu for 2 more days to complete course Continue Advair 250/46mcg  1 puff twice daily Continue Ventolin inhaler as needed for shortness of breath or wheezing Placed on prednisone 40 mg for 4 days, 30 mg for 4 days, 20 mg for 4 days, 10 mg for 4 days then off Spiriva 18 mcg daily  Discharge Diagnoses:    COPD with acute exacerbation ([place])   Tobacco use disorder   Uncontrolled hypertension   COPD exacerbation ([place])   Homelessness   Malnutrition of moderate degree    Hospital Course: 61 y.o. male with COPD, HTN, tobacco use disorder and recent hospitalization for COPD exacerbation returning with shortness of breath, productive cough, wheezing and chest tightness and admitted for COPD exacerbation.    Hospitalized 11/20-11/23 for COPD exacerbation and discharged on albuterol inhaler as needed.  He returned to ED 11/25.work-up including CTA chest was negative except for emphysema.  He was discharged on p.o. prednisone and albuterol inhaler.    Assessment and Plan:   COPD with acute exacerbation ([place]), dyspnea with minimal exertion -Presented with shortness of breath, chest tightness, wheezing and productive cough -CTA chest negative for PE or pneumonia.  CXR with hyperinflation suggesting COPD, emphysema -COVID-19 negative. -Troponins negative, EKG without any ischemia -Patient had slow and progressive improvement, pulmonology was consulted.   -Recommended outpatient will need Trelegy or Breztri versus a combination of Advair and Spiriva -Has scheduled follow-up with pulmonology outpatient and follow-up with [facility] teaching service on 12/19 -Troponins 14-10, 2D echo showed EF 60-65%, G2 DD -Home O2 evaluation done, does  not qualify for O2.      Influenza A positive -Respiratory virus panel shows influenza A positive -Continue Tamiflu, started on 12/2 for 5 days, needs 2 more days -Continue droplet precautions     Tobacco use disorder -Counseled on smoking cessation     Uncontrolled hypertension -Continue Norvasc, losartan, Imdur      Homelessness -TOC consulted, appreciate assistance     Malnutrition of moderate degree - Nutrition Problem: Moderate Malnutrition Etiology: chronic illness (COPD) Signs/Symptoms: moderate fat depletion, severe muscle depletion Estimated body mass index is 23.02 kg/m as calculated from the following:   Height as of this encounter: 6' (1.829 m).   Weight as of this encounter: 77 kg.     Pain control - [employer] Controlled Substance Reporting System database was reviewed. and patient was instructed, not to drive, operate heavy machinery, perform activities at heights, swimming or participation in water activities or provide baby-sitting services while on Pain, Sleep and Anxiety Medications; until their outpatient Physician has advised to do so again. Also recommended to not to take more than prescribed Pain, Sleep and Anxiety Medications.  Consultants: Pulmonology Procedures performed: None Disposition: Home Diet recommendation:  Discharge Diet Orders (From admission, onward)     Start     Ordered   11/07/22 0000  Diet - low sodium heart healthy        11/07/22 1048            DISCHARGE MEDICATION: Allergies as of 11/07/2022   No Known Allergies      Medication List     TAKE these medications    albuterol 108 (90 Base) MCG/ACT  inhaler Commonly known as: VENTOLIN HFA Inhale 1-2 puffs into the lungs every 6 (six) hours as needed for wheezing or shortness of breath. What changed: Another medication with the same name was removed. Continue taking this medication, and follow the directions you see here.   amLODipine 10 MG tablet Commonly known  as: NORVASC Take 1 tablet (10 mg total) by mouth daily.   benzonatate 100 MG capsule Commonly known as: TESSALON Take 1 capsule (100 mg total) by mouth 3 (three) times daily as needed for cough.   busPIRone 5 MG tablet Commonly known as: BUSPAR Take 1 tablet (5 mg total) by mouth 2 (two) times daily.   fluticasone-salmeterol 250-50 MCG/ACT Aepb Commonly known as: Advair Diskus Inhale 1 puff into the lungs in the morning and at bedtime.   guaifenesin 400 MG Tabs tablet Commonly known as: HUMIBID E Take 1.5 tablets (600 mg total) by mouth 2 (two) times daily.   isosorbide mononitrate 60 MG 24 hr tablet Commonly known as: IMDUR Take 1 tablet (60 mg total) by mouth daily. Start taking on: November 08, 2022   losartan 100 MG tablet Commonly known as: COZAAR Take 1 tablet (100 mg total) by mouth daily. Start taking on: November 08, 2022   oseltamivir 75 MG capsule Commonly known as: TAMIFLU Take 1 capsule (75 mg total) by mouth 2 (two) times daily for 2 days.   predniSONE 10 MG tablet Commonly known as: DELTASONE Take 40mg  (4 tabs) x 4 days, 30mg  (3 tabs) x 4 days, 20mg  (2 tabs) x 4 days, 10mg  (1 tab) x 4 days, then Stop. What changed:  medication strength additional instructions   tiotropium 18 MCG inhalation capsule Commonly known as: Spiriva HandiHaler Place 1 capsule (18 mcg total) into inhaler and inhale daily.        Follow-up Information     [facility]. Go on 11/20/2022.   Specialty: Pulmonology Why: You have an appointment on 12/18 at 2pm with Rexene Edison, NP Contact information: [place] [id number] [place] Follow up on 11/21/2022.   Why: You have an appointment with primary care on 11/21/22 at 3:15pm. Please arrive 15 minutes early to complete new patient paperwork. Contact information: [address]  [place] [phone]               Discharge Exam: Danley Danker Weights   11/01/22 1900  Weight: 77 kg   S: Wheezing improving, no fevers or chills or any coughing.  BP (!) 136/92 (BP Location: Left Arm)   Pulse 67   Temp 98.6 F (37 C) (Oral)   Resp (!) 21   Ht 6' (1.829 m)   Wt 77 kg   SpO2 92%   BMI 23.02 kg/m    Physical Exam General: Alert and oriented x 3, NAD Cardiovascular: S1 S2 clear, RRR.  Respiratory: CTAB, no wheezing, rales or rhonchi Gastrointestinal: Soft, nontender, nondistended, NBS Ext: no pedal edema bilaterally Neuro: no new deficits Psych: Normal affect   Condition at discharge: fair  The results of significant diagnostics from this hospitalization (including imaging, microbiology, ancillary and laboratory) are listed below for reference.   Imaging Studies: ECHOCARDIOGRAM COMPLETE  Result Date: 11/03/2022    ECHOCARDIOGRAM REPORT   Patient Name:   Franklin Chavez Date of Exam: 11/03/2022 Medical Rec #:  [id number]       Height:  72.0 in Accession #:    [id number]      Weight:       169.8 lb Date of Birth:  09-10-1961       BSA:          1.987 m Patient Age:    61 years        BP:           143/85 mmHg Patient Gender: M               HR:           87 bpm. Exam Location:  Inpatient Procedure: 2D Echo, Color Doppler and Cardiac Doppler Indications:    Pulmonary hypertension  History:        Patient has no prior history of Echocardiogram examinations.                 COPD, Signs/Symptoms:Shortness of Breath; Risk                 Factors:Hypertension and Current Smoker.  Sonographer:    Milda Smart Referring Phys: [id number] Tobey Grim  Sonographer Comments: Suboptimal parasternal window. Image acquisition challenging due to patient body habitus and Image acquisition challenging due to respiratory motion. IMPRESSIONS  1. Left ventricular ejection fraction, by estimation, is 60 to 65%. The left ventricle has normal function. The left ventricle has no  regional wall motion abnormalities. There is moderate concentric left ventricular hypertrophy. Left ventricular diastolic parameters are consistent with Grade II diastolic dysfunction (pseudonormalization).  2. Right ventricular systolic function is normal. The right ventricular size is normal.  3. The mitral valve is normal in structure. No evidence of mitral valve regurgitation. No evidence of mitral stenosis.  4. The aortic valve is normal in structure. Aortic valve regurgitation is not visualized. No aortic stenosis is present.  5. The inferior vena cava is normal in size with greater than 50% respiratory variability, suggesting right atrial pressure of 3 mmHg. Comparison(s): No prior Echocardiogram. FINDINGS  Left Ventricle: Left ventricular ejection fraction, by estimation, is 60 to 65%. The left ventricle has normal function. The left ventricle has no regional wall motion abnormalities. The left ventricular internal cavity size was normal in size. There is  moderate concentric left ventricular hypertrophy. Left ventricular diastolic parameters are consistent with Grade II diastolic dysfunction (pseudonormalization). Right Ventricle: The right ventricular size is normal. Right ventricular systolic function is normal. Left Atrium: Left atrial size was normal in size. Right Atrium: Right atrial size was normal in size. Pericardium: There is no evidence of pericardial effusion. Mitral Valve: The mitral valve is normal in structure. No evidence of mitral valve regurgitation. No evidence of mitral valve stenosis. Tricuspid Valve: The tricuspid valve is normal in structure. Tricuspid valve regurgitation is not demonstrated. No evidence of tricuspid stenosis. Aortic Valve: The aortic valve is normal in structure. Aortic valve regurgitation is not visualized. No aortic stenosis is present. Pulmonic Valve: The pulmonic valve was normal in structure. Pulmonic valve regurgitation is not visualized. No evidence of  pulmonic stenosis. Aorta: The aortic root is normal in size and structure. Venous: The inferior vena cava is normal in size with greater than 50% respiratory variability, suggesting right atrial pressure of 3 mmHg. IAS/Shunts: No atrial level shunt detected by color flow Doppler.  LEFT VENTRICLE PLAX 2D LVIDd:         3.70 cm      Diastology LVIDs:         2.40 cm  LV e' medial:    5.98 cm/s LV PW:         1.20 cm      LV E/e' medial:  10.3 LV IVS:        1.10 cm      LV e' lateral:   11.50 cm/s                             LV E/e' lateral: 5.3  LV Volumes (MOD) LV vol d, MOD A2C: 85.7 ml LV vol d, MOD A4C: 100.0 ml LV vol s, MOD A2C: 30.0 ml LV vol s, MOD A4C: 26.7 ml LV SV MOD A2C:     55.7 ml LV SV MOD A4C:     100.0 ml LV SV MOD BP:      68.5 ml RIGHT VENTRICLE RV S prime:     14.80 cm/s TAPSE (M-mode): 2.0 cm LEFT ATRIUM             Index        RIGHT ATRIUM           Index LA diam:        3.10 cm 1.56 cm/m   RA Area:     13.10 cm LA Vol (A2C):   68.1 ml 34.27 ml/m  RA Volume:   29.00 ml  14.59 ml/m LA Vol (A4C):   24.7 ml 12.43 ml/m LA Biplane Vol: 44.1 ml 22.19 ml/m  AORTIC VALVE             PULMONIC VALVE LVOT Vmax:   122.00 cm/s PV Vmax:       1.53 m/s LVOT Vmean:  83.900 cm/s PV Vmean:      110.000 cm/s LVOT VTI:    0.180 m     PV VTI:        0.261 m                          PV Peak grad:  9.4 mmHg                          PV Mean grad:  5.0 mmHg  MITRAL VALVE MV Area (PHT): 3.03 cm    SHUNTS MV Decel Time: 250 msec    Systemic VTI: 0.18 m MV E velocity: 61.40 cm/s MV A velocity: 47.60 cm/s MV E/A ratio:  1.29 Kirk Ruths MD Electronically signed by Kirk Ruths MD Signature Date/Time: 11/03/2022/3:50:40 PM    Final    DG CHEST PORT 1 VIEW  Result Date: 11/03/2022 CLINICAL DATA:  Hypoxia EXAM: PORTABLE CHEST 1 VIEW COMPARISON:  Radiograph 10/30/2022 FINDINGS: Unchanged cardiomediastinal silhouette. There is no focal airspace consolidation. Pulmonary hyperinflation with apical predominant  emphysema. There is no pleural effusion or evidence of pneumothorax. No acute osseous abnormality. IMPRESSION: Findings of COPD.  No focal airspace consolidation. Electronically Signed   By: Maurine Simmering M.D.   On: 11/03/2022 13:30   DG Chest 2 View  Result Date: 10/30/2022 CLINICAL DATA:  Cough.  Asthma. EXAM: CHEST - 2 VIEW COMPARISON:  AP chest 10/28/2022 and 10/23/2022; CT chest 10/28/2022 FINDINGS: Cardiac silhouette and mediastinal contours are within normal limits. There is again flattening of the diaphragms and moderate hyperinflation. The lungs are clear. No pleural effusion or pneumothorax. Mild multilevel degenerative disc and endplate changes of the thoracic spine. IMPRESSION: Chronic moderate hyperinflation compatible with the COPD  seen on prior CT. No acute lung process. Electronically Signed   By: Neita Garnet M.D.   On: 10/30/2022 12:01   CT Angio Chest PE W/Cm &/Or Wo Cm  Result Date: 10/29/2022 CLINICAL DATA:  Pulmonary embolism suspected, high probability. Dyspnea. EXAM: CT ANGIOGRAPHY CHEST WITH CONTRAST TECHNIQUE: Multidetector CT imaging of the chest was performed using the standard protocol during bolus administration of intravenous contrast. Multiplanar CT image reconstructions and MIPs were obtained to evaluate the vascular anatomy. RADIATION DOSE REDUCTION: This exam was performed according to the departmental dose-optimization program which includes automated exposure control, adjustment of the mA and/or kV according to patient size and/or use of iterative reconstruction technique. CONTRAST:  43mL OMNIPAQUE IOHEXOL 350 MG/ML SOLN COMPARISON:  None Available. FINDINGS: Cardiovascular: Heart is normal in size and there is no pericardial effusion. Scattered coronary artery calcifications are noted. There is mild atherosclerotic calcification of the aorta without evidence of aneurysm. The pulmonary trunk is mildly distended which may be associated with underlying pulmonary artery  hypertension. No definite evidence of pulmonary embolism. Examination is limited due to respiratory motion artifact. Mediastinum/Nodes: No enlarged mediastinal, hilar, or axillary lymph nodes. Thyroid gland, trachea, and esophagus demonstrate no significant findings. Lungs/Pleura: Advanced paraseptal and centrilobular emphysematous changes are present in the lungs. No consolidation, effusion, or pneumothorax. Upper Abdomen: No acute abnormality. Musculoskeletal: Degenerative changes are present in the thoracic spine. No acute or suspicious osseous abnormality. Review of the MIP images confirms the above findings. IMPRESSION: 1. No evidence of pulmonary embolism or other acute thoracic process. 2. Advanced emphysema. 3. Aortic atherosclerosis and coronary artery calcifications. Electronically Signed   By: Thornell Sartorius M.D.   On: 10/29/2022 00:33   DG Chest Port 1 View  Result Date: 10/28/2022 CLINICAL DATA:  Shortness of breath for 2 days, initial encounter EXAM: PORTABLE CHEST 1 VIEW COMPARISON:  10/23/2022 FINDINGS: Cardiac shadow is within normal limits. The lungs are hyperinflated. No focal infiltrate or effusion is seen. Extrinsic artifact is noted over the upper chest. No bony abnormality is noted. IMPRESSION: No active disease. Electronically Signed   By: Alcide Clever M.D.   On: 10/28/2022 22:18   DG Chest Port 1 View  Result Date: 10/23/2022 CLINICAL DATA:  Shortness of breath. EXAM: PORTABLE CHEST 1 VIEW COMPARISON:  10/05/2022 FINDINGS: Heart size and mediastinal contours are unremarkable. No pleural effusion or edema identified. No airspace opacities. Visualized osseous structures are unremarkable. IMPRESSION: No active disease. Electronically Signed   By: Signa Kell M.D.   On: 10/23/2022 05:19    Microbiology: Results for orders placed or performed during the hospital encounter of 10/30/22  Resp Panel by RT-PCR (Flu A&B, Covid) Anterior Nasal Swab     Status: None   Collection Time:  10/30/22  6:45 PM   Specimen: Anterior Nasal Swab  Result Value Ref Range Status   SARS Coronavirus 2 by RT PCR NEGATIVE NEGATIVE Final    Comment: (NOTE) SARS-CoV-2 target nucleic acids are NOT DETECTED.  The SARS-CoV-2 RNA is generally detectable in upper respiratory specimens during the acute phase of infection. The lowest concentration of SARS-CoV-2 viral copies this assay can detect is 138 copies/mL. A negative result does not preclude SARS-Cov-2 infection and should not be used as the sole basis for treatment or other patient management decisions. A negative result may occur with  improper specimen collection/handling, submission of specimen other than nasopharyngeal swab, presence of viral mutation(s) within the areas targeted by this assay, and inadequate number of viral copies(<138  copies/mL). A negative result must be combined with clinical observations, patient history, and epidemiological information. The expected result is Negative.  Fact Sheet for Patients:  EntrepreneurPulse.com.au  Fact Sheet for Healthcare Providers:  IncredibleEmployment.be  This test is no t yet approved or cleared by the [place] FDA and  has been authorized for detection and/or diagnosis of SARS-CoV-2 by FDA under an Emergency Use Authorization (EUA). This EUA will remain  in effect (meaning this test can be used) for the duration of the COVID-19 declaration under Section 564(b)(1) of the Act, 21 U.S.C.section 360bbb-3(b)(1), unless the authorization is terminated  or revoked sooner.       Influenza A by PCR NEGATIVE NEGATIVE Final   Influenza B by PCR NEGATIVE NEGATIVE Final    Comment: (NOTE) The Xpert Xpress SARS-CoV-2/FLU/RSV plus assay is intended as an aid in the diagnosis of influenza from Nasopharyngeal swab specimens and should not be used as a sole basis for treatment. Nasal washings and aspirates are unacceptable for Xpert Xpress  SARS-CoV-2/FLU/RSV testing.  Fact Sheet for Patients: EntrepreneurPulse.com.au  Fact Sheet for Healthcare Providers: IncredibleEmployment.be  This test is not yet approved or cleared by the [place] FDA and has been authorized for detection and/or diagnosis of SARS-CoV-2 by FDA under an Emergency Use Authorization (EUA). This EUA will remain in effect (meaning this test can be used) for the duration of the COVID-19 declaration under Section 564(b)(1) of the Act, 21 U.S.C. section 360bbb-3(b)(1), unless the authorization is terminated or revoked.  Performed at [facility], [place] [address], [place], [place] [address]   Respiratory (~20 pathogens) panel by PCR     Status: Abnormal   Collection Time: 11/03/22 11:42 AM   Specimen: Nasopharyngeal Swab; Respiratory  Result Value Ref Range Status   Adenovirus NOT DETECTED NOT DETECTED Final   Coronavirus 229E NOT DETECTED NOT DETECTED Final    Comment: (NOTE) The Coronavirus on the Respiratory Panel, DOES NOT test for the novel  Coronavirus (2019 nCoV)    Coronavirus HKU1 NOT DETECTED NOT DETECTED Final   Coronavirus NL63 NOT DETECTED NOT DETECTED Final   Coronavirus OC43 NOT DETECTED NOT DETECTED Final   Metapneumovirus NOT DETECTED NOT DETECTED Final   Rhinovirus / Enterovirus NOT DETECTED NOT DETECTED Final   Influenza A H1 2009 DETECTED (A) NOT DETECTED Final   Influenza B NOT DETECTED NOT DETECTED Final   Parainfluenza Virus 1 NOT DETECTED NOT DETECTED Final   Parainfluenza Virus 2 NOT DETECTED NOT DETECTED Final   Parainfluenza Virus 3 NOT DETECTED NOT DETECTED Final   Parainfluenza Virus 4 NOT DETECTED NOT DETECTED Final   Respiratory Syncytial Virus NOT DETECTED NOT DETECTED Final   Bordetella pertussis NOT DETECTED NOT DETECTED Final   Bordetella Parapertussis NOT DETECTED NOT DETECTED Final   Chlamydophila pneumoniae NOT DETECTED NOT DETECTED Final   Mycoplasma  pneumoniae NOT DETECTED NOT DETECTED Final    Comment: Performed at [facility], [address], [place], [place] [address]    Labs: CBC: Recent Labs  Lab 11/01/22 0545 11/03/22 0726  WBC 12.9* 11.8*  HGB 15.1 15.1  HCT 45.6 46.3  MCV 85.4 87.9  PLT 313 [id number]   Basic Metabolic Panel: Recent Labs  Lab 11/01/22 0545 11/03/22 0726 11/04/22 0528 11/06/22 0424  NA 136 138 139  --   K 4.9 3.9 4.4  --   CL 95* 96* 96*  --   CO2 31 38* 36*  --   GLUCOSE 132* 94 130*  --  BUN 18 16 12   --   CREATININE 0.88 0.84 0.77 0.74  CALCIUM 9.0 8.9 8.8*  --   MG 2.2  --   --   --   PHOS 4.2  --   --   --    Liver Function Tests: Recent Labs  Lab 11/01/22 0545  ALBUMIN 3.2*   CBG: No results for input(s): "GLUCAP" in the last 168 hours.  Discharge time spent: greater than 30 minutes.  Signed: Estill Cotta, MD [facility] 11/07/2022

## 2022-11-07 NOTE — TOC Transition Note (Signed)
Transition of Care [facility]) - CM/SW Discharge Note   Patient Details  Name: Franklin Chavez MRN: [id number] Date of Birth: 02-26-1961  [facility] CM/SW Contact:  Vassie Moselle, LCSW Phone Number: 11/07/2022, 11:36 AM   Clinical Narrative:    Met with pt and reviewed resources for housing/shelters and financial assistance. Pt shares he has been to the [facility] day shelter in the past and plans to return at discharge. Pt is agreeable to having PCP scheduled. An appointment has been scheduled for pt at [facility] on 12/19 at 3:15pm. Information added to AVS.  Pt was provided a bus pass as he does not have means of transportation and does not have income to afford transportation.    Final next level of care: Homeless Shelter Barriers to Discharge: No Barriers Identified   Patient Goals and CMS Choice Patient states their goals for this hospitalization and ongoing recovery are:: To get help with getting a motel CMS Medicare.gov Compare Post Acute Care list provided to:: Patient Choice offered to / list presented to : Patient  Discharge Placement                       Discharge Plan and Services In-house Referral: NA Discharge Planning Services: CM Consult Post Acute Care Choice: NA          DME Arranged: N/A DME Agency: NA                  Social Determinants of Health (SDOH) Interventions Food Insecurity Interventions: Intervention Not Indicated Housing Interventions: Other (Comment) (patient needs a place to live) Transportation Interventions: Intervention Not Indicated Utilities Interventions: Intervention Not Indicated   Readmission Risk Interventions    11/01/2022   12:03 PM  Readmission Risk Prevention Plan  Transportation Screening Complete  PCP or Specialist Appt within 5-7 Days Complete  Home Care Screening Complete  Medication Review (RN CM) Complete

## 2022-11-08 ENCOUNTER — Other Ambulatory Visit (HOSPITAL_COMMUNITY): Payer: Self-pay

## 2022-11-18 ENCOUNTER — Other Ambulatory Visit (HOSPITAL_COMMUNITY): Payer: Self-pay

## 2022-11-20 ENCOUNTER — Inpatient Hospital Stay: Payer: Commercial Managed Care - HMO | Admitting: Adult Health

## 2022-11-28 ENCOUNTER — Other Ambulatory Visit (HOSPITAL_COMMUNITY): Payer: Self-pay

## 2022-12-19 ENCOUNTER — Inpatient Hospital Stay (HOSPITAL_COMMUNITY)
Admission: EM | Admit: 2022-12-19 | Discharge: 2022-12-22 | DRG: 190 | Disposition: A | Discharge status: Home / Self Care | Payer: Medicaid Other | Attending: Internal Medicine | Admitting: Internal Medicine

## 2022-12-19 ENCOUNTER — Other Ambulatory Visit: Payer: Self-pay

## 2022-12-19 ENCOUNTER — Encounter (HOSPITAL_COMMUNITY): Payer: Self-pay | Admitting: Internal Medicine

## 2022-12-19 ENCOUNTER — Emergency Department (HOSPITAL_COMMUNITY): Payer: Medicaid Other

## 2022-12-19 DIAGNOSIS — I5032 Chronic diastolic (congestive) heart failure: Secondary | ICD-10-CM | POA: Diagnosis present

## 2022-12-19 DIAGNOSIS — D72829 Elevated white blood cell count, unspecified: Secondary | ICD-10-CM | POA: Diagnosis present

## 2022-12-19 DIAGNOSIS — J9601 Acute respiratory failure with hypoxia: Secondary | ICD-10-CM | POA: Diagnosis not present

## 2022-12-19 DIAGNOSIS — Z1152 Encounter for screening for COVID-19: Secondary | ICD-10-CM

## 2022-12-19 DIAGNOSIS — I10 Essential (primary) hypertension: Secondary | ICD-10-CM | POA: Diagnosis not present

## 2022-12-19 DIAGNOSIS — Z833 Family history of diabetes mellitus: Secondary | ICD-10-CM

## 2022-12-19 DIAGNOSIS — R6510 Systemic inflammatory response syndrome (SIRS) of non-infectious origin without acute organ dysfunction: Secondary | ICD-10-CM | POA: Diagnosis present

## 2022-12-19 DIAGNOSIS — Z8249 Family history of ischemic heart disease and other diseases of the circulatory system: Secondary | ICD-10-CM

## 2022-12-19 DIAGNOSIS — I110 Hypertensive heart disease with heart failure: Secondary | ICD-10-CM | POA: Diagnosis present

## 2022-12-19 DIAGNOSIS — Z5900 Homelessness unspecified: Secondary | ICD-10-CM

## 2022-12-19 DIAGNOSIS — F1910 Other psychoactive substance abuse, uncomplicated: Secondary | ICD-10-CM | POA: Diagnosis present

## 2022-12-19 DIAGNOSIS — J441 Chronic obstructive pulmonary disease with (acute) exacerbation: Principal | ICD-10-CM | POA: Diagnosis present

## 2022-12-19 DIAGNOSIS — F411 Generalized anxiety disorder: Secondary | ICD-10-CM | POA: Diagnosis present

## 2022-12-19 DIAGNOSIS — Z720 Tobacco use: Secondary | ICD-10-CM | POA: Diagnosis present

## 2022-12-19 DIAGNOSIS — F1410 Cocaine abuse, uncomplicated: Secondary | ICD-10-CM | POA: Diagnosis present

## 2022-12-19 DIAGNOSIS — F17210 Nicotine dependence, cigarettes, uncomplicated: Secondary | ICD-10-CM | POA: Diagnosis present

## 2022-12-19 DIAGNOSIS — Z79899 Other long term (current) drug therapy: Secondary | ICD-10-CM

## 2022-12-19 HISTORY — DX: Generalized anxiety disorder: F41.1

## 2022-12-19 HISTORY — DX: Essential (primary) hypertension: I10

## 2022-12-19 HISTORY — DX: Chronic obstructive pulmonary disease, unspecified: J44.9

## 2022-12-19 LAB — CBC WITH DIFFERENTIAL/PLATELET
Abs Immature Granulocytes: 0.1 10*3/uL — ABNORMAL HIGH (ref 0.00–0.07)
Basophils Absolute: 0.1 10*3/uL (ref 0.0–0.1)
Basophils Relative: 0 %
Eosinophils Absolute: 1.2 10*3/uL — ABNORMAL HIGH (ref 0.0–0.5)
Eosinophils Relative: 7 %
HCT: 48.8 % (ref 39.0–52.0)
Hemoglobin: 16.3 g/dL (ref 13.0–17.0)
Immature Granulocytes: 1 %
Lymphocytes Relative: 12 %
Lymphs Abs: 2.1 10*3/uL (ref 0.7–4.0)
MCH: 28.6 pg (ref 26.0–34.0)
MCHC: 33.4 g/dL (ref 30.0–36.0)
MCV: 85.8 fL (ref 80.0–100.0)
Monocytes Absolute: 1.1 10*3/uL — ABNORMAL HIGH (ref 0.1–1.0)
Monocytes Relative: 6 %
Neutro Abs: 13.3 10*3/uL — ABNORMAL HIGH (ref 1.7–7.7)
Neutrophils Relative %: 74 %
Platelets: 297 10*3/uL (ref 150–400)
RBC: 5.69 MIL/uL (ref 4.22–5.81)
RDW: 14.6 % (ref 11.5–15.5)
WBC: 17.9 10*3/uL — ABNORMAL HIGH (ref 4.0–10.5)
nRBC: 0 % (ref 0.0–0.2)

## 2022-12-19 LAB — BASIC METABOLIC PANEL
Anion gap: 6 (ref 5–15)
BUN: 17 mg/dL (ref 6–20)
CO2: 33 mmol/L — ABNORMAL HIGH (ref 22–32)
Calcium: 8.5 mg/dL — ABNORMAL LOW (ref 8.9–10.3)
Chloride: 99 mmol/L (ref 98–111)
Creatinine, Ser: 0.92 mg/dL (ref 0.61–1.24)
GFR, Estimated: >60 mL/min (ref >60)
Glucose, Bld: 111 mg/dL — ABNORMAL HIGH (ref 70–99)
Potassium: 4 mmol/L (ref 3.5–5.1)
Sodium: 138 mmol/L (ref 135–145)

## 2022-12-19 LAB — RESP PANEL BY RT-PCR (RSV, FLU A&B, COVID)  RVPGX2
Influenza A by PCR: NEGATIVE
Influenza B by PCR: NEGATIVE
Resp Syncytial Virus by PCR: NEGATIVE
SARS Coronavirus 2 by RT PCR: NEGATIVE

## 2022-12-19 MED ORDER — MELATONIN 3 MG PO TABS: 3.0000 mg | ORAL_TABLET | Freq: Every evening | ORAL | Status: DC | PRN

## 2022-12-19 MED ORDER — LOSARTAN POTASSIUM 50 MG PO TABS
100.0000 mg | ORAL_TABLET | Freq: Every day | ORAL | Status: DC
  Administered 2022-12-20 – 2022-12-22 (×3): 100 mg via ORAL
  Filled 2022-12-19: qty 2
  Filled 2022-12-19 (×2): qty 4

## 2022-12-19 MED ORDER — ALBUTEROL SULFATE (2.5 MG/3ML) 0.083% IN NEBU
2.5000 mg | INHALATION_SOLUTION | RESPIRATORY_TRACT | Status: DC | PRN
  Administered 2022-12-19: 2.5 mg via RESPIRATORY_TRACT
  Filled 2022-12-19: qty 3

## 2022-12-19 MED ORDER — ACETAMINOPHEN 650 MG RE SUPP: 650.0000 mg | Freq: Four times a day (QID) | RECTAL | Status: DC | PRN

## 2022-12-19 MED ORDER — SODIUM CHLORIDE 0.9 % IV SOLN
500.0000 mg | INTRAVENOUS | Status: DC
  Administered 2022-12-19 – 2022-12-21 (×3): 500 mg via INTRAVENOUS
  Filled 2022-12-19 (×4): qty 5

## 2022-12-19 MED ORDER — NICOTINE 21 MG/24HR TD PT24: 21.0000 mg | MEDICATED_PATCH | Freq: Every day | TRANSDERMAL | Status: DC | PRN

## 2022-12-19 MED ORDER — BENZONATATE 100 MG PO CAPS
100.0000 mg | ORAL_CAPSULE | Freq: Three times a day (TID) | ORAL | Status: DC | PRN
  Administered 2022-12-20 – 2022-12-22 (×4): 100 mg via ORAL
  Filled 2022-12-19 (×4): qty 1

## 2022-12-19 MED ORDER — METHYLPREDNISOLONE SODIUM SUCC 125 MG IJ SOLR
80.0000 mg | Freq: Two times a day (BID) | INTRAMUSCULAR | Status: DC
  Administered 2022-12-20 – 2022-12-21 (×3): 80 mg via INTRAVENOUS
  Filled 2022-12-19 (×3): qty 2

## 2022-12-19 MED ORDER — LACTATED RINGERS IV SOLN: INTRAVENOUS | Status: DC

## 2022-12-19 MED ORDER — ALBUTEROL SULFATE (2.5 MG/3ML) 0.083% IN NEBU
10.0000 mg/h | INHALATION_SOLUTION | Freq: Once | RESPIRATORY_TRACT | Status: AC
  Administered 2022-12-19: 10 mg/h via RESPIRATORY_TRACT
  Filled 2022-12-19: qty 3

## 2022-12-19 MED ORDER — ACETAMINOPHEN 325 MG PO TABS
650.0000 mg | ORAL_TABLET | Freq: Four times a day (QID) | ORAL | Status: DC | PRN
  Administered 2022-12-20: 650 mg via ORAL
  Filled 2022-12-19: qty 2

## 2022-12-19 MED ORDER — BUSPIRONE HCL 5 MG PO TABS
5.0000 mg | ORAL_TABLET | Freq: Two times a day (BID) | ORAL | Status: DC
  Administered 2022-12-20 – 2022-12-22 (×5): 5 mg via ORAL
  Filled 2022-12-19 (×5): qty 1

## 2022-12-19 MED ORDER — IPRATROPIUM-ALBUTEROL 0.5-2.5 (3) MG/3ML IN SOLN
3.0000 mL | Freq: Four times a day (QID) | RESPIRATORY_TRACT | Status: DC
  Administered 2022-12-20 – 2022-12-22 (×11): 3 mL via RESPIRATORY_TRACT
  Filled 2022-12-19 (×10): qty 3

## 2022-12-19 MED ORDER — ISOSORBIDE MONONITRATE ER 60 MG PO TB24
60.0000 mg | ORAL_TABLET | Freq: Every day | ORAL | Status: DC
  Administered 2022-12-20 – 2022-12-22 (×3): 60 mg via ORAL
  Filled 2022-12-19 (×4): qty 1

## 2022-12-19 MED ORDER — IPRATROPIUM-ALBUTEROL 0.5-2.5 (3) MG/3ML IN SOLN: 3.0000 mL | Freq: Four times a day (QID) | RESPIRATORY_TRACT | Status: DC

## 2022-12-19 NOTE — ED Provider Notes (Signed)
[facility]-EMERGENCY DEPT Provider Note   CSN: [id number] Arrival date & time: 12/19/22  1907     History  Chief Complaint  Patient presents with   Shortness of Breath    Franklin Chavez is a 62 y.o. male.  62 year old male with history of COPD presents with shortness of breath times several days.  Patient does not use oxygen on regular basis.  Has been using his home nebulizer several times a day without relief.  He continues to use tobacco products.  EMS was called and patient was found to be an extremis.  He was given Solu-Medrol 125 IV push along with 2 g magnesium as well as albuterol treatment.  Was hypoxic on room air and was placed on 6 L nasal cannula and transported here.  Patient denies any recent fever.  No vomiting or diarrhea.  No anginal or CHF type symptoms.       Home Medications Prior to Admission medications   Medication Sig Start Date End Date Taking? Authorizing Provider  albuterol (VENTOLIN HFA) 108 (90 Base) MCG/ACT inhaler Inhale 1-2 puffs into the lungs every 6 (six) hours as needed for wheezing or shortness of breath. 11/07/22   Rai, Ripudeep K, MD  amLODipine (NORVASC) 10 MG tablet Take 1 tablet (10 mg total) by mouth daily. 11/07/22 03/07/23  Rai, Delene Ruffini, MD  benzonatate (TESSALON) 100 MG capsule Take 1 capsule (100 mg total) by mouth 3 (three) times daily as needed for cough. 11/07/22   Rai, Ripudeep K, MD  busPIRone (BUSPAR) 5 MG tablet Take 1 tablet (5 mg total) by mouth 2 (two) times daily. 11/07/22   Rai, Ripudeep K, MD  fluticasone-salmeterol (ADVAIR DISKUS) 250-50 MCG/ACT AEPB Inhale 1 puff into the lungs in the morning and at bedtime. 11/07/22   Rai, Delene Ruffini, MD  guaifenesin (HUMIBID E) 400 MG TABS tablet Take 1.5 tablets (600 mg total) by mouth 2 (two) times daily. 11/07/22   Rai, Delene Ruffini, MD  isosorbide mononitrate (IMDUR) 60 MG 24 hr tablet Take 1 tablet (60 mg total) by mouth daily. 11/08/22   Rai, Delene Ruffini, MD  losartan  (COZAAR) 100 MG tablet Take 1 tablet (100 mg total) by mouth daily. 11/08/22   Rai, Delene Ruffini, MD  predniSONE (DELTASONE) 10 MG tablet Take 40mg  (4 tabs) x 4 days, 30mg  (3 tabs) x 4 days, 20mg  (2 tabs) x 4 days, 10mg  (1 tab) x 4 days, then Stop. 11/07/22   Rai, Ripudeep K, MD  tiotropium (SPIRIVA HANDIHALER) 18 MCG inhalation capsule Place 1 capsule (18 mcg total) into inhaler and inhale daily. 11/07/22 02/05/23  , MD      Allergies    Patient has no known allergies.    Review of Systems   Review of Systems  All other systems reviewed and are negative.   Physical Exam Updated Vital Signs BP (!) 142/97   Pulse 80   Temp 97.8 F (36.6 C)   Resp (!) 30   Ht 1.829 m (6')   Wt 72.6 kg   SpO2 91%   BMI 21.70 kg/m  Physical Exam Vitals and nursing note reviewed.  Constitutional:      General: He is not in acute distress.    Appearance: Normal appearance. He is well-developed. He is not toxic-appearing.  HENT:     Head: Normocephalic and atraumatic.  Eyes:     General: Lids are normal.     Conjunctiva/sclera: Conjunctivae normal.  Pupils: Pupils are equal, round, and reactive to light.  Neck:     Thyroid: No thyroid mass.     Trachea: No tracheal deviation.  Cardiovascular:     Rate and Rhythm: Normal rate and regular rhythm.     Heart sounds: Normal heart sounds. No murmur heard.    No gallop.  Pulmonary:     Effort: Tachypnea, accessory muscle usage, prolonged expiration and respiratory distress present.     Breath sounds: No stridor. Examination of the right-upper field reveals decreased breath sounds and wheezing. Examination of the left-upper field reveals decreased breath sounds and wheezing. Decreased breath sounds and wheezing present. No rhonchi or rales.  Abdominal:     General: There is no distension.     Palpations: Abdomen is soft.     Tenderness: There is no abdominal tenderness. There is no rebound.  Musculoskeletal:        General: No  tenderness. Normal range of motion.     Cervical back: Normal range of motion and neck supple.  Skin:    General: Skin is warm and dry.     Findings: No abrasion or rash.  Neurological:     Mental Status: He is alert and oriented to person, place, and time. Mental status is at baseline.     GCS: GCS eye subscore is 4. GCS verbal subscore is 5. GCS motor subscore is 6.     Cranial Nerves: No cranial nerve deficit.     Sensory: No sensory deficit.     Motor: Motor function is intact.  Psychiatric:        Attention and Perception: Attention normal.        Speech: Speech normal.        Behavior: Behavior normal.     ED Results / Procedures / Treatments   Labs (all labs ordered are listed, but only abnormal results are displayed) Labs Reviewed  RESP PANEL BY RT-PCR (RSV, FLU A&B, COVID)  [id number]  BASIC METABOLIC PANEL  CBC WITH DIFFERENTIAL/PLATELET    EKG EKG Interpretation  Date/Time:  Tuesday December 19 2022 19:31:53 EST Ventricular Rate:  82 PR Interval:  155 QRS Duration: 85 QT Interval:  401 QTC Calculation: 469 R Axis:   88 Text Interpretation: Sinus rhythm Confirmed by Lacretia Leigh ([id number]) on 12/19/2022 8:09:23 PM  Radiology No results found.  Procedures Procedures    Medications Ordered in ED Medications  lactated ringers infusion (has no administration in time range)  albuterol (PROVENTIL,VENTOLIN) solution continuous neb (has no administration in time range)    ED Course/ Medical Decision Making/ A&P                             Medical Decision Making Amount and/or Complexity of Data Reviewed Labs: ordered. Radiology: ordered. ECG/medicine tests: ordered.  Risk Prescription drug management.   Patient is EKG per interpretation shows normal sinus rhythm.  No signs of acute coronary ischemia.  Patient's x-ray per interpretation shows mild hyperinflation.  No signs of pneumothorax or infiltrate.  Patient given albuterol 10 mg over 1 hour due to  persistent wheezing likely from bronchoconstriction.  Patient had received aggressive treatment by EMS with steroids, magnesium.  Patient will require admission to the hospital at this time.  Will consult hospitalist  CRITICAL CARE Performed by: Leota Jacobsen Total critical care time: 50 minutes Critical care time was exclusive of separately billable procedures and treating other patients. Critical care was  necessary to treat or prevent imminent or life-threatening deterioration. Critical care was time spent personally by me on the following activities: development of treatment plan with patient and/or surrogate as well as nursing, discussions with consultants, evaluation of patient's response to treatment, examination of patient, obtaining history from patient or surrogate, ordering and performing treatments and interventions, ordering and review of laboratory studies, ordering and review of radiographic studies, pulse oximetry and re-evaluation of patient's condition.         Final Clinical Impression(s) / ED Diagnoses Final diagnoses:  None    Rx / DC Orders ED Discharge Orders     None         Lacretia Leigh, MD 12/19/22 2024

## 2022-12-19 NOTE — ED Notes (Signed)
Pt called out to use restroom, responded to room and pt urinated in sink and detached from oxygen, pt became [facility]. Pt assisted back to bed and placed on oxygen. Pt recovered and oxygen saturation 94% on 4L

## 2022-12-19 NOTE — H&P (Signed)
History and Physical      Franklin Chavez WPY:[id number] DOB: 11-01-1961 DOA: 12/19/2022  PCP: Patient, No Pcp Per (will further assess) Patient coming from: home   I have personally briefly reviewed patient's old medical records in [place]  Chief Complaint: Shortness of breath  HPI: Franklin Chavez is a 62 y.o. male with medical history significant for COPD, chronic tobacco abuse, essential pretension, generalized anxiety disorder, chronic diastolic heart failure, who is admitted to [facility] on 12/19/2022 with acute COPD exacerbation after presenting from home to [facility] ED complaining of shortness of breath.   [facility]ation from the last week November 2023 until 11/07/2022 upon which she was discharged home on a 16-day prednisone taper, with which she reports compliance.  Subsequently, over the course the last week, he has noted progressive shortness of breath associated with new onset nonproductive cough, in the absence of any orthopnea, PND, or worsening peripheral edema.  Is not noted any associated subjective fever, chills, rigors, or generalized myalgias.  Not associate with any rhinitis, rhinorrhea, sore throat, rash.  He also denies any recent chest pain, palpitations, diaphoresis, dizziness, presyncope, or syncope.  No recent trauma or travel.  Denies any recent new onset calf tenderness or new lower extremity erythema.  Acknowledges a history of underlying COPD, reporting compliance with his outpatient respiratory regimen that includes Advair and Spiriva on a scheduled basis.  Additionally, he notes increased use of his prn albuterol inhaler over the last few days, noting that he has been using this inhaler up to 3 times a day over that timeframe, without any significant swelling improvement in his breathing.  Denies any known baseline supplemental oxygen requirements.  He conveys that he continues  to smoke cigarettes.  Medical history also notable for chronic diastolic heart failure, with most recent echocardiogram performed on 11/03/2022, which showed LVEF 65%, no focal motion arise, mild concentric LVH, grade 2 diastolic dysfunction, normal right ventricular systolic function and no evidence of significant valvular pathologies.  No diuretic medications at home.  In the setting of aggressive shortness of breath, the patient contacted EMS who administered solumedrol, albuterol nebulizer, and IV magnesium and route to was in the emergency department for further evaluation management of the above.     ED Course:  Vital signs in the ED were notable for the following: Afebrile; heart rate 80, initial blood pressure 142/97, respiratory rate 30, initial oxygen saturation noted to be in the mid 80s on room air, subs improving to 90% on 4 L nasal cannula.  Labs were notable for the following: BMP notable for the following: Sodium 138 potassium 4.0, creatinine 0.92 compared to 0.77 on 11/04/2022.  CBC notable for evidence of 17,900 with 74% neutrophils, relative to 11,800 on 11/03/2022, hemoglobin 16.3.  COVID, influenza, RSV PCR were all negative.  Per my interpretation, EKG in ED demonstrated the following: EKG, in comparison to most recent prior EKG from 11/01/2022 shows sinus rhythm with heart rate 82, normal intervals, nonspecific T wave inversion in leads V1 and V2, of which T wave version V1 appears unchanged most recent prior EKG, also showing nonspecific less than 1 mm ST elevation in leads V3, V4, V5, although which appears similar to most recent prior EKG from 11/01/2022.   Imaging and additional notable ED work-up: Chest x-ray shows mild hyperinflation, will demonstrated no evidence of acute cardiopulmonary process, including no evidence of ventricular edema, effusion, or  pneumothorax.  While in the ED, the following were administered: Albuterol nebulizer x 1, continuous lactated Ringer's at  125 cc/h.  Subsequently, the patient was admitted for further evaluation management of presenting acute COPD exacerbation, complicated by acute hypoxic respiratory failure,    Review of Systems: As per HPI otherwise 10 point review of systems negative.   Past Medical History:  Diagnosis Date   Poor dentition 12/13/2018   Tobacco use disorder 12/13/2018    No past surgical history on file.  Social History:  reports that he has been smoking. He has never used smokeless tobacco. He reports that he does not drink alcohol and does not use drugs.   No Known Allergies  Family History  Problem Relation Age of Onset   Diabetes Mellitus II Mother    Hypertension Mother     Family history reviewed and not pertinent    Prior to Admission medications   Medication Sig Start Date End Date Taking? Authorizing Provider  albuterol (VENTOLIN HFA) 108 (90 Base) MCG/ACT inhaler Inhale 1-2 puffs into the lungs every 6 (six) hours as needed for wheezing or shortness of breath. 11/07/22   Rai, Ripudeep K, MD  amLODipine (NORVASC) 10 MG tablet Take 1 tablet (10 mg total) by mouth daily. 11/07/22 03/07/23  Rai, Delene Ruffini, MD  benzonatate (TESSALON) 100 MG capsule Take 1 capsule (100 mg total) by mouth 3 (three) times daily as needed for cough. 11/07/22   Rai, Ripudeep K, MD  busPIRone (BUSPAR) 5 MG tablet Take 1 tablet (5 mg total) by mouth 2 (two) times daily. 11/07/22   Rai, Ripudeep K, MD  fluticasone-salmeterol (ADVAIR DISKUS) 250-50 MCG/ACT AEPB Inhale 1 puff into the lungs in the morning and at bedtime. 11/07/22   Rai, Delene Ruffini, MD  guaifenesin (HUMIBID E) 400 MG TABS tablet Take 1.5 tablets (600 mg total) by mouth 2 (two) times daily. 11/07/22   Rai, Delene Ruffini, MD  isosorbide mononitrate (IMDUR) 60 MG 24 hr tablet Take 1 tablet (60 mg total) by mouth daily. 11/08/22   Rai, Delene Ruffini, MD  losartan (COZAAR) 100 MG tablet Take 1 tablet (100 mg total) by mouth daily. 11/08/22   Rai, Delene Ruffini, MD   predniSONE (DELTASONE) 10 MG tablet Take 40mg  (4 tabs) x 4 days, 30mg  (3 tabs) x 4 days, 20mg  (2 tabs) x 4 days, 10mg  (1 tab) x 4 days, then Stop. 11/07/22   Rai, Ripudeep K, MD  tiotropium (SPIRIVA HANDIHALER) 18 MCG inhalation capsule Place 1 capsule (18 mcg total) into inhaler and inhale daily. 11/07/22 02/05/23  , MD     Objective    Physical Exam: Vitals:   12/19/22 1917  BP: (!) 142/97  Pulse: 80  Resp: (!) 30  Temp: 97.8 F (36.6 C)  SpO2: 91%  Weight: 72.6 kg  Height: 6' (1.829 m)    General: appears to be stated age; alert, oriented; mildly increased work of breathing noted Skin: warm, dry, no rash Head:  AT/[place] Mouth:  Oral mucosa membranes appear moist, normal dentition Neck: supple; trachea midline Heart:  RRR; did not appreciate any M/R/G Lungs: CTAB, did not appreciate any wheezes, rales, or rhonchi Abdomen: + BS; soft, ND, NT Vascular: 2+ pedal pulses b/l; 2+ radial pulses b/l Extremities: no peripheral edema, no muscle wasting Neuro: strength and sensation intact in upper and lower extremities b/l    Labs on Admission: I have personally reviewed following labs and imaging studies  CBC: Recent Labs  Lab 12/19/22  1947  WBC 17.9*  NEUTROABS 13.3*  HGB 16.3  HCT 48.8  MCV 85.8  PLT 297   Basic Metabolic Panel: Recent Labs  Lab 12/19/22 1947  NA 138  K 4.0  CL 99  CO2 33*  GLUCOSE 111*  BUN 17  CREATININE 0.92  CALCIUM 8.5*   GFR: Estimated Creatinine Clearance: 87.7 mL/min (by C-G formula based on SCr of 0.92 mg/dL). Liver Function Tests: No results for input(s): "AST", "ALT", "ALKPHOS", "BILITOT", "PROT", "ALBUMIN" in the last 168 hours. No results for input(s): "LIPASE", "AMYLASE" in the last 168 hours. No results for input(s): "AMMONIA" in the last 168 hours. Coagulation Profile: No results for input(s): "INR", "PROTIME" in the last 168 hours. Cardiac Enzymes: No results for input(s): "CKTOTAL", "CKMB", "CKMBINDEX",  "TROPONINI" in the last 168 hours. BNP (last 3 results) No results for input(s): "PROBNP" in the last 8760 hours. HbA1C: No results for input(s): "HGBA1C" in the last 72 hours. CBG: No results for input(s): "GLUCAP" in the last 168 hours. Lipid Profile: No results for input(s): "CHOL", "HDL", "LDLCALC", "TRIG", "CHOLHDL", "LDLDIRECT" in the last 72 hours. Thyroid Function Tests: No results for input(s): "TSH", "T4TOTAL", "FREET4", "T3FREE", "THYROIDAB" in the last 72 hours. Anemia Panel: No results for input(s): "VITAMINB12", "FOLATE", "FERRITIN", "TIBC", "IRON", "RETICCTPCT" in the last 72 hours. Urine analysis: No results found for: "COLORURINE", "APPEARANCEUR", "LABSPEC", "PHURINE", "GLUCOSEU", "HGBUR", "BILIRUBINUR", "KETONESUR", "PROTEINUR", "UROBILINOGEN", "NITRITE", "LEUKOCYTESUR"  Radiological Exams on Admission: DG Chest Port 1 View  Result Date: 12/19/2022 CLINICAL DATA:  Shortness of breath since November. Hypoxic on room air today. EXAM: PORTABLE CHEST 1 VIEW COMPARISON:  11/03/2022 FINDINGS: Heart size and pulmonary vascularity are normal. Hyperinflation of the lungs. No airspace disease or consolidation. No pleural effusions. No pneumothorax. Mediastinal contours appear intact. IMPRESSION: Mild hyperinflation.  No evidence of active pulmonary disease. Electronically Signed   By: Burman Nieves M.D.   On: 12/19/2022 19:49      Assessment/Plan    Principal Problem:   Acute exacerbation of chronic obstructive pulmonary disease (COPD) (HCC) Active Problems:   Tobacco abuse   Essential hypertension   Acute respiratory failure with hypoxia (HCC)   SIRS (systemic inflammatory response syndrome) (HCC)   Leukocytosis   Chronic diastolic CHF (congestive heart failure) (HCC)     #) Acute COPD exacerbation: in the context of a documented history of COPD, diagnosis of acute exacerbation on the basis of 1 week of progressive shortness of breath associate with increased work  of breathing, associated with acute respiratory failure, as further quantified below, with presenting CXR showing no evidence of acute cardiopulmonary process, including no evidence of infiltrate, edema, effusion, or pneumothorax. Etiology of exac is not entirely clear at this time. no clinical or radiographic evidence to suggest acutely decompensated heart failure at this time, but will add on BNP in the context of a documented history of chronic diastolic heart failure. Additionally, ACS appears less likely in the absence of chest pain, with EKG relative to most recent prior EKG from 11/01/2022, showing nonspecific T wave inversion in V2, but otherwise no interval changes relative to the nonspecific T wave inversion and nonspecific ST elevation seen on previous EKG.   Clinically, acute PE also appears to be less likely at this time. COVID-19/influenza/RSV PCR are negative. Will add-on procalcitonin to further eval for underlying pna. Outpatient respiratory regimen appears to include Advair, Spiriva, and prn albuterol inhaler, with good reported associated compliance.  It is noted that the patient continues to smoke cigarettes.  Plan: monitor continuous pulse oxymetry. Monitor on telemetry. Solumedrol. Scheduled duonebs q6 hours. Prn albuterol inhaler.  CMP in the morning. Repeat CBC in the morning. Check serum Mg and Phos levels. Will attempt additional chart review to evaluate most recent PFT results. Will start Azithromycin  for benefit of shortened duration of hospitalization associated with antibiotic initiation in the setting of acute COPD exacerbation. Check blood gas. Add-on procalcitonin, BNP.  Constipation importance of smoking discontinuation, as further detailed below.  Prn Tessalon Perles, urine drug screen, flutter valve, incentive spirometry            #) Acute hypoxic respiratory failure: in the context of acute respiratory symptoms and no known baseline supplemental O2  requirements, presenting O2 sat note to be mid 80s on room air, socially improving into the low 90s on 4 l nasal cannula. Appears to be on basis of acute COPD exacerbation, as further detailed above. Presenting CXR shows no evidence of acute cardiopulmonary process.  As described above, ACS appears less likely at this time as this acute pulmonary embolism.  COVID, flu, RSV PCR all negative.  Further assessing for potential underlying pneumonia via procalcitonin level, as above.  No clinical or radiographic evidence to suggest acute decompensated heart failure at this time.     Plan: further evaluation/management of presenting acute COPD exacerbation, as above. Monitor continuous pulse ox with prn supplemental O2 to maintain O2 sats greater than or equal to 92%. monitor on telemetry. CMP/CBC in the AM. Check serum Mg and Phos levels. Check blood gas. Flutter valve, incentive spirometry.  On BMP, procalcitonin level.               #) SIRS criteria present: Presentation associated with leukocytosis, tachypnea.  However, in the absence of e/o underlying infection at this time, including, chest x-ray that showed no evidence of underlying pneumonia, along with negative COVID, RSV, influenza PCR, criteria for sepsis not currently met.  suspect non-infectious factors contributing to these SIRS findings, noting that the patient had a leukocytosis at time of discharge from his recent prior hospitalization, with interval 16-day course of prednisone taper, potentially representing a pharmacologic attribution to this elevated white blood cell count, in addition to increased risk for reactive leukocytosis in the context of presenting acute COPD exacerbationb with the latter also contributory to the aforementioned tachypnea. . patient appears hemodynamically stable at this time.  Consequently, will refrain from initiation of IV antibiotics at this time, aside from the previously described initiation of  azithromycin for duration benefit of hospitalization in the context of acute COPD exacerbation requiring hospitalization.  Will further assess for underlying infectious process, as further detailed below.   Plan: Repeat CBC with diff in the AM.  Monitor strict I's and O's and daily weights.  Monitor on telemetry.  Continue azithromycin for COPD exacerbation, but otherwise refraining from IV abx for now, as above.  Check procalcitonin, urinalysis.             #) Chronic tobacco abuse: Patient conveys that they are a current smoker, having smoked approximately 1 ppd for greater than 20 years.   Plan: Counseled the patient for less than 2 minutes on the importance of complete smoking discontinuation.  Order placed for prn nicotine patch for use during this hospitalization.             #) Essential Hypertension: documented h/o such, with outpatient antihypertensive regimen including losartan, amlodipine, Imdur.  SBP's in the ED today: In the 140s mmHg.  Plan: Close monitoring of subsequent BP via routine VS. for now, will resume losartan and Imdur, while holding home Norvasc and closely monitoring interval blood pressure trend.  Monitor strict I's and O's and daily weights.           #) Generalized anxiety disorder: documented h/o such. On BuSpar as outpatient.    Plan: Continue home BuSpar.               #) Chronic diastolic heart failure: documented history of such, with most recent echocardiogram performed on 11/03/2022, with results notable for LVEF 66 5% as well as grade 2 diastolic dysfunction, with additional details as conveyed above. No clinical or radiographic evidence to suggest acutely decompensated heart failure at this time. home diuretic regimen reportedly consists of the following: None.    Plan: monitor strict I's & O's and daily weights. Repeat CMP in AM. Check serum mag level.  Add on BNP level.       DVT prophylaxis: SCD's   Code  Status: Full code Family Communication: none Disposition Plan: Per Rounding Team Consults called: none;  Admission status: Inpatient    I SPENT GREATER THAN 75  MINUTES IN CLINICAL CARE TIME/MEDICAL DECISION-MAKING IN COMPLETING THIS ADMISSION.     Chaney Born Catherine Cubero DO [facility] From 7PM - 7AM   12/19/2022, 9:11 PM

## 2022-12-19 NOTE — ED Triage Notes (Signed)
Pt arrives with reports of [facility] today. Pt has hx of COPD. Pt was given 125 solumedrol. 2g mag and duo neb with EMS. Pt hypoxic with EMS on room air.

## 2022-12-20 ENCOUNTER — Other Ambulatory Visit (HOSPITAL_COMMUNITY): Payer: Self-pay

## 2022-12-20 DIAGNOSIS — J441 Chronic obstructive pulmonary disease with (acute) exacerbation: Secondary | ICD-10-CM | POA: Diagnosis not present

## 2022-12-20 LAB — BLOOD GAS, VENOUS
Acid-Base Excess: 7.8 mmol/L — ABNORMAL HIGH (ref 0.0–2.0)
Bicarbonate: 34.2 mmol/L — ABNORMAL HIGH (ref 20.0–28.0)
O2 Saturation: 94.9 %
Patient temperature: 37
pCO2, Ven: 54 mmHg (ref 44–60)
pH, Ven: 7.41 (ref 7.25–7.43)
pO2, Ven: 67 mmHg — ABNORMAL HIGH (ref 32–45)

## 2022-12-20 LAB — URINALYSIS, COMPLETE (UACMP) WITH MICROSCOPIC
Bacteria, UA: NONE SEEN
Bilirubin Urine: NEGATIVE
Glucose, UA: >=500 mg/dL — AB
Hgb urine dipstick: NEGATIVE
Ketones, ur: NEGATIVE mg/dL
Leukocytes,Ua: NEGATIVE
Nitrite: NEGATIVE
Protein, ur: NEGATIVE mg/dL
Specific Gravity, Urine: 1.015 (ref 1.005–1.030)
pH: 5 (ref 5.0–8.0)

## 2022-12-20 LAB — CBC WITH DIFFERENTIAL/PLATELET
Abs Immature Granulocytes: 0.09 10*3/uL — ABNORMAL HIGH (ref 0.00–0.07)
Basophils Absolute: 0 10*3/uL (ref 0.0–0.1)
Basophils Relative: 0 %
Eosinophils Absolute: 0 10*3/uL (ref 0.0–0.5)
Eosinophils Relative: 0 %
HCT: 45.9 % (ref 39.0–52.0)
Hemoglobin: 15.3 g/dL (ref 13.0–17.0)
Immature Granulocytes: 1 %
Lymphocytes Relative: 5 %
Lymphs Abs: 0.7 10*3/uL (ref 0.7–4.0)
MCH: 28.3 pg (ref 26.0–34.0)
MCHC: 33.3 g/dL (ref 30.0–36.0)
MCV: 85 fL (ref 80.0–100.0)
Monocytes Absolute: 0.2 10*3/uL (ref 0.1–1.0)
Monocytes Relative: 2 %
Neutro Abs: 12 10*3/uL — ABNORMAL HIGH (ref 1.7–7.7)
Neutrophils Relative %: 92 %
Platelets: 314 10*3/uL (ref 150–400)
RBC: 5.4 MIL/uL (ref 4.22–5.81)
RDW: 14.6 % (ref 11.5–15.5)
WBC: 12.9 10*3/uL — ABNORMAL HIGH (ref 4.0–10.5)
nRBC: 0 % (ref 0.0–0.2)

## 2022-12-20 LAB — RAPID URINE DRUG SCREEN, HOSP PERFORMED
Amphetamines: NOT DETECTED
Barbiturates: NOT DETECTED
Benzodiazepines: NOT DETECTED
Cocaine: POSITIVE — AB
Opiates: NOT DETECTED
Tetrahydrocannabinol: NOT DETECTED

## 2022-12-20 LAB — MAGNESIUM: Magnesium: 2.4 mg/dL (ref 1.7–2.4)

## 2022-12-20 LAB — COMPREHENSIVE METABOLIC PANEL
ALT: 20 U/L (ref 0–44)
AST: 25 U/L (ref 15–41)
Albumin: 3 g/dL — ABNORMAL LOW (ref 3.5–5.0)
Alkaline Phosphatase: 29 U/L — ABNORMAL LOW (ref 38–126)
Anion gap: 6 (ref 5–15)
BUN: 20 mg/dL (ref 6–20)
CO2: 29 mmol/L (ref 22–32)
Calcium: 8.3 mg/dL — ABNORMAL LOW (ref 8.9–10.3)
Chloride: 101 mmol/L (ref 98–111)
Creatinine, Ser: 0.93 mg/dL (ref 0.61–1.24)
GFR, Estimated: >60 mL/min (ref >60)
Glucose, Bld: 159 mg/dL — ABNORMAL HIGH (ref 70–99)
Potassium: 4.9 mmol/L (ref 3.5–5.1)
Sodium: 136 mmol/L (ref 135–145)
Total Bilirubin: 0.6 mg/dL (ref 0.3–1.2)
Total Protein: 5.4 g/dL — ABNORMAL LOW (ref 6.5–8.1)

## 2022-12-20 LAB — PHOSPHORUS: Phosphorus: 3.7 mg/dL (ref 2.5–4.6)

## 2022-12-20 LAB — PROCALCITONIN: Procalcitonin: <0.1 ng/mL

## 2022-12-20 LAB — BRAIN NATRIURETIC PEPTIDE: B Natriuretic Peptide: 14.2 pg/mL (ref 0.0–100.0)

## 2022-12-20 MED ORDER — GUAIFENESIN ER 600 MG PO TB12
600.0000 mg | ORAL_TABLET | Freq: Two times a day (BID) | ORAL | Status: DC
  Administered 2022-12-20 – 2022-12-22 (×5): 600 mg via ORAL
  Filled 2022-12-20 (×5): qty 1

## 2022-12-20 NOTE — ED Notes (Signed)
Hospitalist at bedside  Pt given Sprite per request.

## 2022-12-20 NOTE — Progress Notes (Addendum)
PROGRESS NOTE    STANTON KISSOON  CXK:[id number] DOB: October 01, 1961 DOA: 12/19/2022 PCP: Patient, No Pcp Per     Brief Narrative:  H/o HTN, COPD, not on home 02, chronic smoker, ( reports has cut down since last discharge), sent to ED vis EMS due to sob, per EMS, he Was hypoxic on room air and was placed on 6 L nasal cannula and transported here.    Subjective:  Feeling better, but still wheezing, tachypnea, denies chest pain, no fever, no edema  Assessment & Plan:  Principal Problem:   Acute exacerbation of chronic obstructive pulmonary disease (COPD) (HCC) Active Problems:   Tobacco abuse   Essential hypertension   Acute respiratory failure with hypoxia (HCC)   SIRS (systemic inflammatory response syndrome) (HCC)   Leukocytosis   Chronic diastolic CHF (congestive heart failure) (HCC)    Assessment and Plan:  Acute hypoxic respiratory failure /COPD exacerbation, and current smoker (report has cut down since last discharge) -Reports has no pcp, run out all meds that was prescribed to him during last hospitalization Transitional care consulted -Chest x-ray on presentation "Mild hyperinflation. No evidence of active pulmonary disease " .-COVID/influenza/RSV screening negative -Continue IV steroid, nebulizer, antibiotics -Smoking cessation provided, he declined nicotine patch  HTN BP stable on current regimen  Polysubstance abuse UDS + cocaine which could be a trigger for copd    I have Reviewed nursing notes, Vitals, pain scores, I/o's, Lab results and  imaging results since pt's last encounter, details please see discussion above  I ordered the following labs:  Unresulted Labs (From admission, onward)     Start     Ordered   12/21/22 0500  CBC with Differential/Platelet  Tomorrow morning,   R        12/20/22 1816   12/21/22 9702  Basic metabolic panel  Tomorrow morning,   R        12/20/22 1816   12/21/22 0500  Magnesium  Tomorrow morning,   R        12/20/22 1816    12/21/22 0500  Phosphorus  Tomorrow morning,   R        12/20/22 1816             DVT prophylaxis: SCDs Start: 12/19/22 2108   Code Status:   Code Status: Full Code  Family Communication: None at bedside Disposition:   Dispo: The patient is from: home              Anticipated d/c is to: home              Anticipated d/c date is: >24hrs  Antimicrobials:    Anti-infectives (From admission, onward)    Start     Dose/Rate Route Frequency Ordered Stop   12/19/22 2200  azithromycin (ZITHROMAX) 500 mg in sodium chloride 0.9 % 250 mL IVPB       Note to Pharmacy: (In setting of acute copd exac)   500 mg 250 mL/hr over 60 Minutes Intravenous Every 24 hours 12/19/22 2110            Objective: Vitals:   12/20/22 1530 12/20/22 1545 12/20/22 1600 12/20/22 1700  BP:   114/71 122/60  Pulse: 73 77 80 83  Resp: 13 (!) 22 16 (!) 27  Temp:    98.3 F (36.8 C)  TempSrc:      SpO2: 91% 94% 100% 94%  Weight:      Height:        Intake/Output  Summary (Last 24 hours) at 12/20/2022 1816 Last data filed at 12/19/2022 2316 Gross per 24 hour  Intake 246.82 ml  Output --  Net 246.82 ml   Filed Weights   12/19/22 1917  Weight: 72.6 kg    Examination:  General exam: alert, awake, communicative,calm, NAD Respiratory system: Diffuse wheezing bilaterally, prolonged expiratory phase, slight tachypnea, no accessory muscle use, able to talk in full sentences. Cardiovascular system:  RRR.  Gastrointestinal system: Abdomen is nondistended, soft and nontender.  Normal bowel sounds heard. Central nervous system: Alert and oriented. No focal neurological deficits. Extremities:  no edema Skin: No rashes, lesions or ulcers Psychiatry: Judgement and insight appear normal. Mood & affect appropriate.     Data Reviewed: I have personally reviewed  labs and visualized  imaging studies since the last encounter and formulate the plan        Scheduled Meds:  busPIRone  5 mg Oral BID    guaiFENesin  600 mg Oral BID   ipratropium-albuterol  3 mL Nebulization Q6H   isosorbide mononitrate  60 mg Oral Daily   losartan  100 mg Oral Daily   methylPREDNISolone (SOLU-MEDROL) injection  80 mg Intravenous Q12H   Continuous Infusions:  azithromycin Stopped (12/19/22 2316)     LOS: 1 day   Time spent:  70mins  Florencia Reasons, MD PhD FACP [facility]  Available via Epic secure chat 7am-7pm for nonurgent issues Please page for urgent issues To page the attending provider between 7A-7P or the covering provider during after hours 7P-7A, please log into the web site [email or web address] and access using universal [place] password for that web site. If you do not have the password, please call the hospital operator.    12/20/2022, 6:16 PM

## 2022-12-21 ENCOUNTER — Encounter (HOSPITAL_COMMUNITY): Payer: Self-pay | Admitting: Internal Medicine

## 2022-12-21 DIAGNOSIS — J441 Chronic obstructive pulmonary disease with (acute) exacerbation: Secondary | ICD-10-CM | POA: Diagnosis not present

## 2022-12-21 LAB — BASIC METABOLIC PANEL
Anion gap: 5 (ref 5–15)
Anion gap: 9 (ref 5–15)
BUN: 17 mg/dL (ref 6–20)
BUN: 23 mg/dL — ABNORMAL HIGH (ref 6–20)
CO2: 20 mmol/L — ABNORMAL LOW (ref 22–32)
CO2: 29 mmol/L (ref 22–32)
Calcium: 5.3 mg/dL — CL (ref 8.9–10.3)
Calcium: 8.6 mg/dL — ABNORMAL LOW (ref 8.9–10.3)
Chloride: 114 mmol/L — ABNORMAL HIGH (ref 98–111)
Chloride: 99 mmol/L (ref 98–111)
Creatinine, Ser: 0.55 mg/dL — ABNORMAL LOW (ref 0.61–1.24)
Creatinine, Ser: 0.9 mg/dL (ref 0.61–1.24)
GFR, Estimated: >60 mL/min (ref >60)
GFR, Estimated: >60 mL/min (ref >60)
Glucose, Bld: 97 mg/dL (ref 70–99)
Glucose, Bld: 119 mg/dL — ABNORMAL HIGH (ref 70–99)
Potassium: 2.9 mmol/L — ABNORMAL LOW (ref 3.5–5.1)
Potassium: 4.7 mmol/L (ref 3.5–5.1)
Sodium: 139 mmol/L (ref 135–145)
Sodium: 137 mmol/L (ref 135–145)

## 2022-12-21 LAB — CBC WITH DIFFERENTIAL/PLATELET
Abs Immature Granulocytes: 0.1 10*3/uL — ABNORMAL HIGH (ref 0.00–0.07)
Basophils Absolute: 0 10*3/uL (ref 0.0–0.1)
Basophils Relative: 0 %
Eosinophils Absolute: 0 10*3/uL (ref 0.0–0.5)
Eosinophils Relative: 0 %
HCT: 41 % (ref 39.0–52.0)
Hemoglobin: 13.8 g/dL (ref 13.0–17.0)
Immature Granulocytes: 1 %
Lymphocytes Relative: 3 %
Lymphs Abs: 0.7 10*3/uL (ref 0.7–4.0)
MCH: 28.3 pg (ref 26.0–34.0)
MCHC: 33.7 g/dL (ref 30.0–36.0)
MCV: 84.2 fL (ref 80.0–100.0)
Monocytes Absolute: 0.6 10*3/uL (ref 0.1–1.0)
Monocytes Relative: 3 %
Neutro Abs: 20.7 10*3/uL — ABNORMAL HIGH (ref 1.7–7.7)
Neutrophils Relative %: 93 %
Platelets: 296 10*3/uL (ref 150–400)
RBC: 4.87 MIL/uL (ref 4.22–5.81)
RDW: 14.4 % (ref 11.5–15.5)
WBC: 22 10*3/uL — ABNORMAL HIGH (ref 4.0–10.5)
nRBC: 0 % (ref 0.0–0.2)

## 2022-12-21 LAB — PHOSPHORUS: Phosphorus: 3.2 mg/dL (ref 2.5–4.6)

## 2022-12-21 LAB — MAGNESIUM: Magnesium: 1.6 mg/dL — ABNORMAL LOW (ref 1.7–2.4)

## 2022-12-21 MED ORDER — PREDNISONE 20 MG PO TABS
60.0000 mg | ORAL_TABLET | Freq: Every day | ORAL | Status: DC
  Administered 2022-12-22: 60 mg via ORAL
  Filled 2022-12-21: qty 3

## 2022-12-21 NOTE — ED Notes (Signed)
Patient belongings placed on bed along with meds/ neb machine, coat x 2, [place] boots, slip on shoes. Phone and other items.  He is ready for transport to his room.

## 2022-12-21 NOTE — Progress Notes (Signed)
PROGRESS NOTE    Franklin Chavez  EQA:[id number] DOB: 29-Aug-1961 DOA: 12/19/2022 PCP: Patient, No Pcp Per     Brief Narrative:  H/o HTN, COPD, not on home 02, chronic smoker, ( reports has cut down since last discharge), sent to ED vis EMS due to sob, per EMS, he Was hypoxic on room air and was placed on 6 L nasal cannula and transported here.    Subjective:  Feeling better, still some productive cough, but less wheezing, tachypnea  has resolved he denies chest pain, no fever, no edema  Assessment & Plan:  Principal Problem:   Acute exacerbation of chronic obstructive pulmonary disease (COPD) ([place]) Active Problems:   Tobacco abuse   Essential hypertension   Acute respiratory failure with hypoxia (HCC)   SIRS (systemic inflammatory response syndrome) (HCC)   Leukocytosis   Chronic diastolic CHF (congestive heart failure) (HCC)    Assessment and Plan:  Acute hypoxic respiratory failure /COPD exacerbation, and current smoker (report has cut down since last discharge) -Reports has no pcp, run out all meds that was prescribed to him during last hospitalization Transitional care consulted -Chest x-ray on presentation "Mild hyperinflation. No evidence of active pulmonary disease " .-COVID/influenza/RSV screening negative -treated with  IV steroid, nebulizer, antibiotics, improving, change to oral prednisone and oral abx, possible discharge tomorrow -Smoking cessation provided, he declined nicotine patch  Leukocytosis Appear chronic since 12/2018, currently on steroids Add Blood culture Ua on sign of infection, cxr no acute infiltrate   HTN BP stable on current regimen  Polysubstance abuse UDS + cocaine which could be a trigger for copd    I have Reviewed nursing notes, Vitals, pain scores, I/o's, Lab results and  imaging results since pt's last encounter, details please see discussion above  I ordered the following labs:  Unresulted Labs (From admission, onward)      Start     Ordered   12/22/22 0500  Iron and TIBC  Tomorrow morning,   R        12/21/22 1836   12/22/22 0500  CBC with Differential/Platelet  Tomorrow morning,   R        12/21/22 1837   12/22/22 9798  Basic metabolic panel  Tomorrow morning,   R        12/21/22 1837   12/22/22 0500  Magnesium  Tomorrow morning,   R        12/21/22 1837   12/21/22 1753  Culture, blood (Routine X 2) w Reflex to ID Panel  BLOOD CULTURE X 2,   R      12/21/22 1752             DVT prophylaxis: SCDs Start: 12/19/22 2108   Code Status:   Code Status: Full Code  Family Communication: None at bedside Disposition:   Dispo: The patient is from: home              Anticipated d/c is to: home              Anticipated d/c date is: possible on 1/19  Antimicrobials:    Anti-infectives (From admission, onward)    Start     Dose/Rate Route Frequency Ordered Stop   12/19/22 2200  azithromycin (ZITHROMAX) 500 mg in sodium chloride 0.9 % 250 mL IVPB       Note to Pharmacy: (In setting of acute copd exac)   500 mg 250 mL/hr over 60 Minutes Intravenous Every 24 hours 12/19/22 2110  Objective: Vitals:   12/21/22 1400 12/21/22 1421 12/21/22 1437 12/21/22 1827  BP: 130/81 126/70  116/76  Pulse: 72 74  86  Resp: (!) 21 20  20   Temp:  97.9 F (36.6 C)  98.2 F (36.8 C)  TempSrc:  Oral  Oral  SpO2: 94% 94% 91% 97%  Weight:  70.6 kg    Height:        Intake/Output Summary (Last 24 hours) at 12/21/2022 1911 Last data filed at 12/21/2022 1359 Gross per 24 hour  Intake 250 ml  Output 800 ml  Net -550 ml   Filed Weights   12/19/22 1917 12/21/22 1421  Weight: 72.6 kg 70.6 kg    Examination:  General exam: alert, awake, communicative,calm, NAD Respiratory system: Diffuse wheezing bilaterally has almost resolved , improved aeration, normal respiratory effort Cardiovascular system:  RRR.  Gastrointestinal system: Abdomen is nondistended, soft and nontender.  Normal bowel sounds  heard. Central nervous system: Alert and oriented. No focal neurological deficits. Extremities:  no edema Skin: No rashes, lesions or ulcers Psychiatry: Judgement and insight appear normal. Mood & affect appropriate.     Data Reviewed: I have personally reviewed  labs and visualized  imaging studies since the last encounter and formulate the plan        Scheduled Meds:  busPIRone  5 mg Oral BID   guaiFENesin  600 mg Oral BID   ipratropium-albuterol  3 mL Nebulization Q6H   isosorbide mononitrate  60 mg Oral Daily   losartan  100 mg Oral Daily   [START ON 12/22/2022] predniSONE  60 mg Oral Q breakfast   Continuous Infusions:  azithromycin Stopped (12/20/22 2239)     LOS: 2 days   Time spent:  79mins  Florencia Reasons, MD PhD FACP [facility]  Available via Epic secure chat 7am-7pm for nonurgent issues Please page for urgent issues To page the attending provider between 7A-7P or the covering provider during after hours 7P-7A, please log into the web site [email or web address] and access using universal [place] password for that web site. If you do not have the password, please call the hospital operator.    12/21/2022, 7:11 PM

## 2022-12-22 ENCOUNTER — Other Ambulatory Visit (HOSPITAL_COMMUNITY): Payer: Self-pay

## 2022-12-22 DIAGNOSIS — J441 Chronic obstructive pulmonary disease with (acute) exacerbation: Secondary | ICD-10-CM | POA: Diagnosis not present

## 2022-12-22 LAB — IRON AND TIBC
Iron: 66 ug/dL (ref 45–182)
Saturation Ratios: 18 % (ref 17.9–39.5)
TIBC: 360 ug/dL (ref 250–450)
UIBC: 294 ug/dL

## 2022-12-22 LAB — CBC WITH DIFFERENTIAL/PLATELET
Abs Immature Granulocytes: 0.16 10*3/uL — ABNORMAL HIGH (ref 0.00–0.07)
Basophils Absolute: 0 10*3/uL (ref 0.0–0.1)
Basophils Relative: 0 %
Eosinophils Absolute: 0 10*3/uL (ref 0.0–0.5)
Eosinophils Relative: 0 %
HCT: 38.6 % — ABNORMAL LOW (ref 39.0–52.0)
Hemoglobin: 13.2 g/dL (ref 13.0–17.0)
Immature Granulocytes: 1 %
Lymphocytes Relative: 7 %
Lymphs Abs: 1.4 10*3/uL (ref 0.7–4.0)
MCH: 28.8 pg (ref 26.0–34.0)
MCHC: 34.2 g/dL (ref 30.0–36.0)
MCV: 84.1 fL (ref 80.0–100.0)
Monocytes Absolute: 1.5 10*3/uL — ABNORMAL HIGH (ref 0.1–1.0)
Monocytes Relative: 7 %
Neutro Abs: 17.3 10*3/uL — ABNORMAL HIGH (ref 1.7–7.7)
Neutrophils Relative %: 85 %
Platelets: 283 10*3/uL (ref 150–400)
RBC: 4.59 MIL/uL (ref 4.22–5.81)
RDW: 14.4 % (ref 11.5–15.5)
WBC: 20.4 10*3/uL — ABNORMAL HIGH (ref 4.0–10.5)
nRBC: 0 % (ref 0.0–0.2)

## 2022-12-22 LAB — BASIC METABOLIC PANEL
Anion gap: 10 (ref 5–15)
BUN: 21 mg/dL — ABNORMAL HIGH (ref 6–20)
CO2: 26 mmol/L (ref 22–32)
Calcium: 8.2 mg/dL — ABNORMAL LOW (ref 8.9–10.3)
Chloride: 102 mmol/L (ref 98–111)
Creatinine, Ser: 1 mg/dL (ref 0.61–1.24)
GFR, Estimated: >60 mL/min (ref >60)
Glucose, Bld: 94 mg/dL (ref 70–99)
Potassium: 4.1 mmol/L (ref 3.5–5.1)
Sodium: 138 mmol/L (ref 135–145)

## 2022-12-22 LAB — MAGNESIUM: Magnesium: 2.1 mg/dL (ref 1.7–2.4)

## 2022-12-22 MED ORDER — LOSARTAN POTASSIUM 100 MG PO TABS
100.0000 mg | ORAL_TABLET | Freq: Every day | ORAL | 3 refills | Status: AC
  Filled 2022-12-22: qty 30, 30d supply, fill #0

## 2022-12-22 MED ORDER — IPRATROPIUM-ALBUTEROL 0.5-2.5 (3) MG/3ML IN SOLN: 3.0000 mL | Freq: Three times a day (TID) | RESPIRATORY_TRACT | Status: DC

## 2022-12-22 MED ORDER — AZITHROMYCIN 500 MG PO TABS: 500.0000 mg | ORAL_TABLET | Freq: Every day | ORAL | 0 refills | Status: DC

## 2022-12-22 MED ORDER — ISOSORBIDE MONONITRATE ER 60 MG PO TB24: 60.0000 mg | ORAL_TABLET | Freq: Every day | ORAL | 3 refills | Status: DC

## 2022-12-22 MED ORDER — PREDNISONE 10 MG PO TABS: ORAL_TABLET | ORAL | 0 refills | Status: DC

## 2022-12-22 MED ORDER — PREDNISONE 10 MG (21) PO TBPK
ORAL_TABLET | ORAL | 0 refills | Status: DC
  Filled 2022-12-22: qty 21, 6d supply, fill #0

## 2022-12-22 MED ORDER — ISOSORBIDE MONONITRATE ER 60 MG PO TB24
60.0000 mg | ORAL_TABLET | Freq: Every day | ORAL | 3 refills | Status: AC
  Filled 2022-12-22: qty 30, 30d supply, fill #0

## 2022-12-22 MED ORDER — LOSARTAN POTASSIUM 100 MG PO TABS: 100.0000 mg | ORAL_TABLET | Freq: Every day | ORAL | 3 refills | Status: DC

## 2022-12-22 MED ORDER — AZITHROMYCIN 500 MG PO TABS
500.0000 mg | ORAL_TABLET | Freq: Every day | ORAL | 0 refills | Status: AC
  Filled 2022-12-22: qty 2, 2d supply, fill #0

## 2022-12-22 MED ORDER — GUAIFENESIN ER 600 MG PO TB12: 600.0000 mg | ORAL_TABLET | Freq: Two times a day (BID) | ORAL | Status: AC

## 2022-12-22 NOTE — Plan of Care (Signed)
  Problem: Clinical Measurements: Goal: Diagnostic test results will improve Outcome: Progressing Goal: Respiratory complications will improve Outcome: Progressing

## 2022-12-22 NOTE — Discharge Summary (Addendum)
Discharge Summary  Franklin Chavez [id number] DOB: 1961/06/06  PCP: Franklin Chavez  transitional care nurse care manager with [facility]  Franklin Chavez  Phone Number: [phone]   Admit date: 12/19/2022 Discharge date: 12/22/2022  30 Day Unplanned Readmission Risk Score    Flowsheet Row ED to Hosp-Admission (Current) from 12/19/2022 in [facility]  30 Day Unplanned Readmission Risk Score (%) 24.87 Filed at 12/22/2022 0801       This score is the patient's risk of an unplanned readmission within 30 days of being discharged (0 -100%). The score is based on dignosis, age, lab data, medications, orders, and past utilization.   Low:  0-14.9   Medium: 15-21.9   High: 22-29.9   Extreme: 30 and above         Time spent: 43mins, more than 50% time spent on coordination of care.   Recommendations for Outpatient Follow-up:   F/u with PCP at  [facility] a week  for hospital discharge follow up, repeat cbc/bmp at follow up, [place]. is made  aware his hospitalization and follow up needs, staff message sent to transitional care nurse care manager Franklin Chavez  with [facility]   F/u with pulmonology    Discharge Diagnoses:  Active Hospital Problems   Diagnosis Date Noted   Acute exacerbation of chronic obstructive pulmonary disease (COPD) ([place]) 12/19/2022   Tobacco abuse 12/13/2018    Priority: 2.   Acute respiratory failure with hypoxia ([place]) 12/19/2022   SIRS (systemic inflammatory response syndrome) (HCC) 12/19/2022   Leukocytosis 12/19/2022   Chronic diastolic CHF (congestive heart failure) ([place]) 12/19/2022   Essential hypertension 10/23/2022    Resolved Hospital Problems  No resolved problems to display.    Discharge Condition: stable  Diet recommendation: regular diet   Filed Weights   12/19/22 1917 12/21/22 1421  Weight:  72.6 kg 70.6 kg    History of present illness:  Franklin Chavez is a 62 y.o. male with medical history significant for COPD, chronic tobacco abuse, essential pretension, generalized anxiety disorder, chronic diastolic heart failure, who is admitted to [facility] on 12/19/2022 with acute COPD exacerbation   Hospital Course:  Principal Problem:   Acute exacerbation of chronic obstructive pulmonary disease (COPD) ([place]) Active Problems:   Tobacco abuse   Essential hypertension   Acute respiratory failure with hypoxia (HCC)   SIRS (systemic inflammatory response syndrome) (HCC)   Leukocytosis   Chronic diastolic CHF (congestive heart failure) (HCC)   Assessment and Plan:   Acute hypoxic respiratory failure /COPD exacerbation, and current smoker (report has cut down since last discharge with a plan to quit) -Reports has no pcp, run out all meds that was prescribed to him during last hospitalization Transitional care consulted -Chest x-ray on presentation "Mild hyperinflation. No evidence of active pulmonary disease " .-COVID/influenza/RSV screening negative -treated with  IV steroid, nebulizer, antibiotics, improving, change to oral prednisone and oral abx, stable to  discharge from the hospital -Smoking cessation provided, he declined nicotine patch -he is advised to follow up with pcp and pulmonology   Leukocytosis Appear chronic since 12/2018, currently on steroids Blood culture no growth Ua on sign of infection, cxr no acute infiltrate  F/u with pcp   HTN BP stable on current regimen   Polysubstance abuse UDS + cocaine which could be a trigger for copd  Cigarette smoking cessation education provided   Homelessness  Transitional care consulted  [facility] Detail please see transitional care note   Discharge Exam: BP (!) 149/71 (BP Location: Left Arm)   Pulse 76   Temp 98.5 F (36.9 C) (Oral)    Resp 20   Ht 6' (1.829 m)   Wt 70.6 kg   SpO2 92%   BMI 21.11 kg/m   General: NAD Cardiovascular: RRR Respiratory: wheezing has resolved, clear to auscultation, good aeration, normal respiratory effort    Discharge Instructions     Diet general   Complete by: As directed    Increase activity slowly   Complete by: As directed       Allergies as of 12/22/2022   No Known Allergies      Medication List     STOP taking these medications    amLODipine 10 MG tablet Commonly known as: NORVASC   benzonatate 100 MG capsule Commonly known as: TESSALON   guaifenesin 400 MG Tabs tablet Commonly known as: HUMIBID E Replaced by: guaiFENesin 600 MG 12 hr tablet       TAKE these medications    albuterol 108 (90 Base) MCG/ACT inhaler Commonly known as: VENTOLIN HFA Inhale 1-2 puffs into the lungs every 6 (six) hours as needed for wheezing or shortness of breath.   azithromycin 500 MG tablet Commonly known as: Zithromax Take 1 tablet (500 mg total) by mouth daily for 2 days   busPIRone 5 MG tablet Commonly known as: BUSPAR Take 1 tablet (5 mg total) by mouth 2 (two) times daily.   fluticasone-salmeterol 250-50 MCG/ACT Aepb Commonly known as: Advair Diskus Inhale 1 puff into the lungs in the morning and at bedtime.   guaiFENesin 600 MG 12 hr tablet Commonly known as: MUCINEX Take 1 tablet (600 mg total) by mouth 2 (two) times daily. Replaces: guaifenesin 400 MG Tabs tablet   isosorbide mononitrate 60 MG 24 hr tablet Commonly known as: IMDUR Take 1 tablet (60 mg total) by mouth daily.   losartan 100 MG tablet Commonly known as: COZAAR Take 1 tablet (100 mg total) by mouth daily.   predniSONE 10 MG tablet Commonly known as: DELTASONE Label  & dispense according to the schedule below. 6 Pills PO on day one then, 5 Pills PO on day two, 4 Pills PO on day three, 3Pills PO on day four, 2 Pills PO on day five, 1 Pills PO on day six,  then STOP.  Total of 21  tabs What changed: additional instructions   tiotropium 18 MCG inhalation capsule Commonly known as: Spiriva HandiHaler Place 1 capsule (18 mcg total) into inhaler and inhale daily.        No Known Allergies  Follow-up Information     please follow up with your pcp Follow up.           [facility]. Call.   Specialty: Pulmonology Why: follow up for COPD management Contact information: [address] [place] [address] [phone]        [facility], [place]. Follow up in 1 week(s).   Why: hospital discharge follow up Contact information: [address] [place] [address] [phone]                  The results of significant diagnostics from this hospitalization (including imaging, microbiology, ancillary and laboratory) are listed below for reference.    Significant Diagnostic  Studies: DG Chest Port 1 View  Result Date: 12/19/2022 CLINICAL DATA:  Shortness of breath since November. Hypoxic on room air today. EXAM: PORTABLE CHEST 1 VIEW COMPARISON:  11/03/2022 FINDINGS: Heart size and pulmonary vascularity are normal. Hyperinflation of the lungs. No airspace disease or consolidation. No pleural effusions. No pneumothorax. Mediastinal contours appear intact. IMPRESSION: Mild hyperinflation.  No evidence of active pulmonary disease. Electronically Signed   By: Burman Nieves M.D.   On: 12/19/2022 19:49    Microbiology: Recent Results (from the past 240 hour(s))  Resp panel by RT-PCR (RSV, Flu A&B, Covid) Anterior Nasal Swab     Status: None   Collection Time: 12/19/22  7:47 PM   Specimen: Anterior Nasal Swab  Result Value Ref Range Status   SARS Coronavirus 2 by RT PCR NEGATIVE NEGATIVE Final    Comment: (NOTE) SARS-CoV-2 target nucleic acids are NOT DETECTED.  The SARS-CoV-2 RNA is generally detectable in upper respiratory specimens during the acute phase of infection.  The lowest concentration of SARS-CoV-2 viral copies this assay can detect is 138 copies/mL. A negative result does not preclude SARS-Cov-2 infection and should not be used as the sole basis for treatment or other patient management decisions. A negative result may occur with  improper specimen collection/handling, submission of specimen other than nasopharyngeal swab, presence of viral mutation(s) within the areas targeted by this assay, and inadequate number of viral copies(<138 copies/mL). A negative result must be combined with clinical observations, patient history, and epidemiological information. The expected result is Negative.  Fact Sheet for Patients:  BloggerCourse.com  Fact Sheet for Healthcare Providers:  SeriousBroker.it  This test is no t yet approved or cleared by the [place] FDA and  has been authorized for detection and/or diagnosis of SARS-CoV-2 by FDA under an Emergency Use Authorization (EUA). This EUA will remain  in effect (meaning this test can be used) for the duration of the COVID-19 declaration under Section 564(b)(1) of the Act, 21 U.S.C.section 360bbb-3(b)(1), unless the authorization is terminated  or revoked sooner.       Influenza A by PCR NEGATIVE NEGATIVE Final   Influenza B by PCR NEGATIVE NEGATIVE Final    Comment: (NOTE) The Xpert Xpress SARS-CoV-2/FLU/RSV plus assay is intended as an aid in the diagnosis of influenza from Nasopharyngeal swab specimens and should not be used as a sole basis for treatment. Nasal washings and aspirates are unacceptable for Xpert Xpress SARS-CoV-2/FLU/RSV testing.  Fact Sheet for Patients: BloggerCourse.com  Fact Sheet for Healthcare Providers: SeriousBroker.it  This test is not yet approved or cleared by the [place] FDA and has been authorized for detection and/or diagnosis of SARS-CoV-2 by FDA  under an Emergency Use Authorization (EUA). This EUA will remain in effect (meaning this test can be used) for the duration of the COVID-19 declaration under Section 564(b)(1) of the Act, 21 U.S.C. section 360bbb-3(b)(1), unless the authorization is terminated or revoked.     Resp Syncytial Virus by PCR NEGATIVE NEGATIVE Final    Comment: (NOTE) Fact Sheet for Patients: BloggerCourse.com  Fact Sheet for Healthcare Providers: SeriousBroker.it  This test is not yet approved or cleared by the [place] FDA and has been authorized for detection and/or diagnosis of SARS-CoV-2 by FDA under an Emergency Use Authorization (EUA). This EUA will remain in effect (meaning this test can be used) for the duration of the COVID-19 declaration under Section 564(b)(1) of the Act, 21 U.S.C. section 360bbb-3(b)(1), unless the authorization is terminated or revoked.  Performed  at [facility], [place] [address], [place], [place] [address]   Culture, blood (Routine X 2) w Reflex to ID Panel     Status: None (Preliminary result)   Collection Time: 12/21/22  6:47 PM   Specimen: BLOOD  Result Value Ref Range Status   Specimen Description   Final    BLOOD BLOOD RIGHT ARM AEROBIC BOTTLE ONLY ANAEROBIC BOTTLE ONLY Performed at [facility], [place] [address] [place], [place] [address]    Special Requests   Final    BOTTLES DRAWN AEROBIC AND ANAEROBIC Blood Culture adequate volume Performed at [facility] [address] [place], [place] [address]    Culture   Final    NO GROWTH < 12 HOURS Performed at [facility] [address], [place], [place] [address]    Report Status PENDING  Incomplete  Culture, blood (Routine X 2) w Reflex to ID Panel     Status: None (Preliminary result)   Collection Time: 12/21/22  6:53 PM   Specimen: BLOOD  Result Value Ref Range Status   Specimen  Description   Final    BLOOD BLOOD LEFT HAND AEROBIC BOTTLE ONLY Performed at [facility] [address], [place], [place] [address]    Special Requests   Final    BOTTLES DRAWN AEROBIC ONLY Blood Culture adequate volume Performed at [facility] [address], [place], [place] [address]    Culture   Final    NO GROWTH < 12 HOURS Performed at [facility] [address] [place], [place] [address]    Report Status PENDING  Incomplete     Labs: Basic Metabolic Panel: Recent Labs  Lab 12/19/22 1947 12/20/22 0404 12/21/22 0456 12/21/22 0824 12/22/22 0506  NA 138 136 139 137 138  K 4.0 4.9 2.9* 4.7 4.1  CL 99 101 114* 99 102  CO2 33* 29 20* 29 26  GLUCOSE 111* 159* 97 119* 94  BUN 17 20 17  23* 21*  CREATININE 0.92 0.93 0.55* 0.90 1.00  CALCIUM 8.5* 8.3* 5.3* 8.6* 8.2*  MG  --  2.4 1.6*  --  2.1  PHOS  --  3.7 3.2  --   --    Liver Function Tests: Recent Labs  Lab 12/20/22 0404  AST 25  ALT 20  ALKPHOS 29*  BILITOT 0.6  PROT 5.4*  ALBUMIN 3.0*   No results for input(s): "LIPASE", "AMYLASE" in the last 168 hours. No results for input(s): "AMMONIA" in the last 168 hours. CBC: Recent Labs  Lab 12/19/22 1947 12/20/22 0404 12/21/22 0456 12/22/22 0506  WBC 17.9* 12.9* 22.0* 20.4*  NEUTROABS 13.3* 12.0* 20.7* 17.3*  HGB 16.3 15.3 13.8 13.2  HCT 48.8 45.9 41.0 38.6*  MCV 85.8 85.0 84.2 84.1  PLT 297 314 296 283   Cardiac Enzymes: No results for input(s): "CKTOTAL", "CKMB", "CKMBINDEX", "TROPONINI" in the last 168 hours. BNP: BNP (last 3 results) Recent Labs    11/03/22 1402 12/20/22 0404  BNP 85.2 14.2    ProBNP (last 3 results) No results for input(s): "PROBNP" in the last 8760 hours.  CBG: No results for input(s): "GLUCAP" in the last 168 hours.  FURTHER DISCHARGE INSTRUCTIONS:   Get Medicines reviewed and adjusted: Please take all your medications with you for your next visit with your Primary  MD   Laboratory/radiological data: Please request your Primary MD to go over all hospital tests and procedure/radiological results  at the follow up, please ask your Primary MD to get all Hospital records sent to his/her office.   In some cases, they will be blood work, cultures and biopsy results pending at the time of your discharge. Please request that your primary care M.D. goes through all the records of your hospital data and follows up on these results.   Also Note the following: If you experience worsening of your admission symptoms, develop shortness of breath, life threatening emergency, suicidal or homicidal thoughts you must seek medical attention immediately by calling 911 or calling your MD immediately  if symptoms less severe.   You must read complete instructions/literature along with all the possible adverse reactions/Chavez effects for all the Medicines you take and that have been prescribed to you. Take any new Medicines after you have completely understood and accpet all the possible adverse reactions/Chavez effects.    Do not drive when taking Pain medications or sleeping medications (Benzodaizepines)   Do not take more than prescribed Pain, Sleep and Anxiety Medications. It is not advisable to combine anxiety,sleep and pain medications without talking with your primary care practitioner   Special Instructions: If you have smoked or chewed Tobacco  in the last 2 yrs please stop smoking, stop any regular Alcohol  and or any Recreational drug use.   Wear Seat belts while driving.   Please note: You were cared for by a hospitalist during your hospital stay. Once you are discharged, your primary care physician will handle any further medical issues. Please note that NO REFILLS for any discharge medications will be authorized once you are discharged, as it is imperative that you return to your primary care physician (or establish a relationship with a primary care physician if you do  not have one) for your post hospital discharge needs so that they can reassess your need for medications and monitor your lab values.     Signed:  Albertine Grates MD, PhD, FACP  [facility] 12/22/2022, 7:27 PM

## 2022-12-22 NOTE — TOC Initial Note (Signed)
Transition of Care [facility]) - Initial/Assessment Note    Patient Details  Name: Franklin Chavez MRN: [id number] Date of Birth: [birth year]  [facility]) CM/SW Contact:    Leeroy Cha, RN Phone Number: 12/22/2022, 10:16 AM  Clinical Narrative:                 Smoking cessation information and copd added to the dc instructions.  Expected Discharge Plan: Home/Self Care Barriers to Discharge: Continued Medical Work up   Patient Goals and CMS Choice Patient states their goals for this hospitalization and ongoing recovery are:: to go back home CMS Medicare.gov Compare Post Acute Care list provided to:: Patient Choice offered to / list presented to : Patient      Expected Discharge Plan and Services   Discharge Planning Services: CM Consult   Living arrangements for the past 2 months: [place]                                      Prior Living Arrangements/Services Living arrangements for the past 2 months: Single Family Home Lives with:: Spouse Patient language and need for interpreter reviewed:: Yes Do you feel safe going back to the place where you live?: Yes            Criminal Activity/Legal Involvement Pertinent to Current Situation/Hospitalization: No - Comment as needed  Activities of Daily Living Home Assistive Devices/Equipment: Nebulizer ADL Screening (condition at time of admission) Patient's cognitive ability adequate to safely complete daily activities?: Yes Is the patient deaf or have difficulty hearing?: No Does the patient have difficulty seeing, even when wearing glasses/contacts?: No Does the patient have difficulty concentrating, remembering, or making decisions?: No Patient able to express need for assistance with ADLs?: Yes Does the patient have difficulty dressing or bathing?: No Independently performs ADLs?: Yes (appropriate for developmental age) Does the patient have difficulty walking or climbing stairs?:  No Weakness of Legs: None Weakness of Arms/Hands: None  Permission Sought/Granted                  Emotional Assessment Appearance:: Appears stated age Attitude/Demeanor/Rapport: Engaged Affect (typically observed): Appropriate Orientation: : Oriented to Place, Oriented to  Time, Oriented to Self, Oriented to Situation Alcohol / Substance Use: Tobacco Use (current smoker with hx of copd) Psych Involvement: No (comment)  Admission diagnosis:  Acute exacerbation of chronic obstructive pulmonary disease (COPD) ([place]) [J44.1] COPD exacerbation ([place]) [J44.1] Patient Active Problem List   Diagnosis Date Noted   Acute exacerbation of chronic obstructive pulmonary disease (COPD) ([place]) 12/19/2022   Acute respiratory failure with hypoxia ([place]) 12/19/2022   SIRS (systemic inflammatory response syndrome) ([place]) 12/19/2022   Leukocytosis 12/19/2022   Chronic diastolic CHF (congestive heart failure) ([place]) 12/19/2022   Malnutrition of moderate degree 11/01/2022   Homelessness 10/30/2022   Arthritis 10/25/2022   COPD exacerbation ([employer]) 10/24/2022   COPD with acute exacerbation ([place]) 10/23/2022   Hyperglycemia 10/23/2022   Hypokalemia 10/23/2022   Essential hypertension 10/23/2022   Tobacco abuse 12/13/2018   Poor dentition 12/13/2018   Foreign body of left middle finger with infection 12/10/2018   PCP:  Patient, No Pcp Per Pharmacy:   [id number] [place], [place] [address] Phone: [phone] Fax: [facility] [address] [place] [address] Phone: [phone] Fax: [phone]  Social Determinants of Health (SDOH) Social History: SDOH Screenings   Food Insecurity: Food Insecurity Present (12/21/2022)  Housing: High Risk (12/21/2022)  Transportation Needs: No Transportation Needs (12/21/2022)  Utilities: Not At Risk (12/21/2022)  Tobacco Use: High Risk  (12/21/2022)   SDOH Interventions:     Readmission Risk Interventions   Row Labels 11/01/2022   12:03 PM  Readmission Risk Prevention Plan   Section Header. No data exists in this row.   Transportation Screening   Complete  PCP or Specialist Appt within 5-7 Days   Complete  Home Care Screening   Complete  Medication Review (RN CM)   Complete

## 2022-12-22 NOTE — TOC Transition Note (Addendum)
Transition of Care [facility]) - CM/SW Discharge Note   Patient Details  Name: Franklin Chavez MRN: [id number] Date of Birth: 1961-05-03  Transition of Care [facility]) CM/SW Contact:  Leeroy Cha, RN Phone Number: 12/22/2022, 12:05 PM   Clinical Narrative:    Patient discharged to return home with self care.  No toc needs present. Patient states that he homeless.  Information for city block to help with getting a hotel room sent to floor rn.  [employer] wants the patient to call.  [phone].  Pt states that he is going to [facility] for 7 days for rehab.   Bus pass given to p[atient.  States that he [place] . Final next level of care: Home/Self Care Barriers to Discharge: Barriers Resolved   Patient Goals and CMS Choice CMS Medicare.gov Compare Post Acute Care list provided to:: Patient Choice offered to / list presented to : Patient  Discharge Placement                         Discharge Plan and Services Additional resources added to the After Visit Summary for     Discharge Planning Services: CM Consult                                 Social Determinants of Health (SDOH) Interventions SDOH Screenings   Food Insecurity: Food Insecurity Present (12/21/2022)  Housing: High Risk (12/21/2022)  Transportation Needs: No Transportation Needs (12/21/2022)  Utilities: Not At Risk (12/21/2022)  Tobacco Use: High Risk (12/21/2022)     Readmission Risk Interventions   Row Labels 11/01/2022   12:03 PM  Readmission Risk Prevention Plan   Section Header. No data exists in this row.   Transportation Screening   Complete  PCP or Specialist Appt within 5-7 Days   Complete  Home Care Screening   Complete  Medication Review (RN CM)   Complete

## 2022-12-22 NOTE — Plan of Care (Signed)
  Problem: Education: Goal: Knowledge of General Education information will improve Description: Including pain rating scale, medication(s)/side effects and non-pharmacologic comfort measures Outcome: Adequate for Discharge   Problem: Clinical Measurements: Goal: Ability to maintain clinical measurements within normal limits will improve Outcome: Adequate for Discharge Goal: Respiratory complications will improve Outcome: Adequate for Discharge   Problem: Safety: Goal: Ability to remain free from injury will improve Outcome: Adequate for Discharge

## 2022-12-23 ENCOUNTER — Other Ambulatory Visit: Payer: Self-pay

## 2022-12-23 ENCOUNTER — Emergency Department (HOSPITAL_COMMUNITY)
Admission: EM | Admit: 2022-12-23 | Discharge: 2022-12-23 | Disposition: A | Discharge status: Home / Self Care | Payer: Medicaid Other | Attending: Emergency Medicine | Admitting: Emergency Medicine

## 2022-12-23 ENCOUNTER — Encounter (HOSPITAL_COMMUNITY): Payer: Self-pay

## 2022-12-23 ENCOUNTER — Emergency Department (HOSPITAL_COMMUNITY): Payer: Medicaid Other

## 2022-12-23 DIAGNOSIS — R0602 Shortness of breath: Secondary | ICD-10-CM

## 2022-12-23 DIAGNOSIS — J449 Chronic obstructive pulmonary disease, unspecified: Secondary | ICD-10-CM | POA: Insufficient documentation

## 2022-12-23 LAB — CBC WITH DIFFERENTIAL/PLATELET
Abs Immature Granulocytes: 0.08 10*3/uL — ABNORMAL HIGH (ref 0.00–0.07)
Basophils Absolute: 0 10*3/uL (ref 0.0–0.1)
Basophils Relative: 0 %
Eosinophils Absolute: 0.2 10*3/uL (ref 0.0–0.5)
Eosinophils Relative: 2 %
HCT: 43.8 % (ref 39.0–52.0)
Hemoglobin: 14.6 g/dL (ref 13.0–17.0)
Immature Granulocytes: 1 %
Lymphocytes Relative: 14 %
Lymphs Abs: 1.8 10*3/uL (ref 0.7–4.0)
MCH: 28.1 pg (ref 26.0–34.0)
MCHC: 33.3 g/dL (ref 30.0–36.0)
MCV: 84.4 fL (ref 80.0–100.0)
Monocytes Absolute: 1.1 10*3/uL — ABNORMAL HIGH (ref 0.1–1.0)
Monocytes Relative: 9 %
Neutro Abs: 9.5 10*3/uL — ABNORMAL HIGH (ref 1.7–7.7)
Neutrophils Relative %: 74 %
Platelets: 293 10*3/uL (ref 150–400)
RBC: 5.19 MIL/uL (ref 4.22–5.81)
RDW: 14.3 % (ref 11.5–15.5)
WBC: 12.7 10*3/uL — ABNORMAL HIGH (ref 4.0–10.5)
nRBC: 0 % (ref 0.0–0.2)

## 2022-12-23 LAB — BASIC METABOLIC PANEL
Anion gap: 9 (ref 5–15)
BUN: 17 mg/dL (ref 6–20)
CO2: 31 mmol/L (ref 22–32)
Calcium: 8.4 mg/dL — ABNORMAL LOW (ref 8.9–10.3)
Chloride: 98 mmol/L (ref 98–111)
Creatinine, Ser: 0.87 mg/dL (ref 0.61–1.24)
GFR, Estimated: >60 mL/min (ref >60)
Glucose, Bld: 114 mg/dL — ABNORMAL HIGH (ref 70–99)
Potassium: 4.2 mmol/L (ref 3.5–5.1)
Sodium: 138 mmol/L (ref 135–145)

## 2022-12-23 MED ORDER — IPRATROPIUM-ALBUTEROL 0.5-2.5 (3) MG/3ML IN SOLN
3.0000 mL | Freq: Once | RESPIRATORY_TRACT | Status: AC
  Administered 2022-12-23: 3 mL via RESPIRATORY_TRACT
  Filled 2022-12-23: qty 3

## 2022-12-23 NOTE — ED Triage Notes (Signed)
Patient is brought in by EMS for SOB, he has a hx of COPD. Was just discharged yesterday.

## 2022-12-23 NOTE — ED Provider Notes (Signed)
[place] AT [facility] Provider Note   CSN: [id number] Arrival date & time: 12/23/22  1749     History Chief Complaint  Patient presents with   Shortness of Breath    HPI KHASIR WOODROME is a 62 y.o. male presenting for chief complaint shortness of breath.  He is a 62 year old male with multiple comorbid medical problems including COPD.  He is not on oxygen at baseline.  Just discharged yesterday after admission for COPD exacerbation.  States he picked up his prednisone this morning but tonight while he was out in the cold he felt his shortness of breath worsen again.  Denies fevers chills nausea vomiting syncope chest pain.  Currently satting 97% on room air. Speaking in full sentences without distress.   Patient's recorded medical, surgical, social, medication list and allergies were reviewed in the Snapshot window as part of the initial history.   Review of Systems   Review of Systems  Constitutional:  Negative for chills and fever.  HENT:  Negative for ear pain and sore throat.   Eyes:  Negative for pain and visual disturbance.  Respiratory:  Positive for shortness of breath and wheezing. Negative for cough.   Cardiovascular:  Negative for chest pain and palpitations.  Gastrointestinal:  Negative for abdominal pain and vomiting.  Genitourinary:  Negative for dysuria and hematuria.  Musculoskeletal:  Negative for arthralgias and back pain.  Skin:  Negative for color change and rash.  Neurological:  Negative for seizures and syncope.  All other systems reviewed and are negative.   Physical Exam Updated Vital Signs BP (!) 155/83 (BP Location: Left Arm)   Pulse 71   Temp 98.3 F (36.8 C) (Oral)   Resp 16   Ht 6' (1.829 m)   Wt 67.1 kg   SpO2 97%   BMI 20.07 kg/m  Physical Exam Vitals and nursing note reviewed.  Constitutional:      General: He is not in acute distress.    Appearance: He is well-developed.  HENT:     Head:  Normocephalic and atraumatic.  Eyes:     Conjunctiva/sclera: Conjunctivae normal.  Cardiovascular:     Rate and Rhythm: Normal rate and regular rhythm.     Heart sounds: No murmur heard. Pulmonary:     Effort: Pulmonary effort is normal. No respiratory distress.     Breath sounds: Examination of the right-lower field reveals wheezing. Examination of the left-lower field reveals wheezing. Wheezing present. No decreased breath sounds.  Abdominal:     Palpations: Abdomen is soft.     Tenderness: There is no abdominal tenderness.  Musculoskeletal:        General: No swelling.     Cervical back: Neck supple.  Skin:    General: Skin is warm and dry.     Capillary Refill: Capillary refill takes less than 2 seconds.  Neurological:     Mental Status: He is alert.  Psychiatric:        Mood and Affect: Mood normal.      ED Course/ Medical Decision Making/ A&P    Procedures Procedures   Medications Ordered in ED Medications  ipratropium-albuterol (DUONEB) 0.5-2.5 (3) MG/3ML nebulizer solution 3 mL (has no administration in time range)   Medical Decision Making:   BRAXLEY BALANDRAN is a 62 y.o. male with a history of COPD, who presented to the ED today with acute on chronic SOB. They are endorsing worsening of their baseline dyspnea over the past  72 hours. Their baseline is a 0L O2 requirement. At their baseline they are able to get around the neighborhood and they are not able to at this time.   On my initial exam, the pt was SOB and tachypneic. Audible wheezing and grossly decreased breath sounds appreciated.  They are endorsing increased sputum production.    Reviewed and confirmed nursing documentation for past medical history, family history, social history.    Initial Assessment:   With the patient's presentation of SOB in the above setting, most likely diagnosis is COPD Exacerbation. Other diagnoses were considered including (but not limited to) CAP, PE, ACS, viral infection, PTX.  These are considered less likely due to history of present illness and physical exam findings.   This is most consistent with an acute life/limb threatening illness complicated by underlying chronic conditions.  Initial Plan:  Empiric treatment of patient's symptoms with immediate initiation of inhaled bronchodilators and continuation of his PO steroids.  Evaluation for infectious versus intrathoracic abnormality with chest x-ray  Screening labs including CBC and Metabolic panel to evaluate for infectious or metabolic etiology of disease.  Patient's Wells score is low and patient does not warrant further objective evaluation for PE based on consistency of presentation of alternative diagnosis.  Objective evaluation as below reviewed   Initial Study Results:   Laboratory  All laboratory results reviewed without evidence of clinically relevant pathology.    EKG EKG was reviewed independently. Rate, rhythm, axis, intervals all examined and without medically relevant abnormality. ST segments without concerns for elevations.    Radiology:  All images reviewed independently. Agree with radiology report at this time.   DG Chest 2 View  Result Date: 12/23/2022 CLINICAL DATA:  Shortness of breath. EXAM: CHEST - 2 VIEW COMPARISON:  December 19, 2022 FINDINGS: The heart size and mediastinal contours are within normal limits. The lungs are hyperinflated. Both lungs are clear. The visualized skeletal structures are unremarkable. IMPRESSION: No active cardiopulmonary disease. Electronically Signed   By: Virgina Norfolk M.D.   On: 12/23/2022 18:35   DG Chest Port 1 View  Result Date: 12/19/2022 CLINICAL DATA:  Shortness of breath since November. Hypoxic on room air today. EXAM: PORTABLE CHEST 1 VIEW COMPARISON:  11/03/2022 FINDINGS: Heart size and pulmonary vascularity are normal. Hyperinflation of the lungs. No airspace disease or consolidation. No pleural effusions. No pneumothorax. Mediastinal contours  appear intact. IMPRESSION: Mild hyperinflation.  No evidence of active pulmonary disease. Electronically Signed   By: Lucienne Capers M.D.   On: 12/19/2022 19:49    Final Assessment and Plan:   On arrival today, patient has reassuring exam, no acute distress.  No evidence of pneumonia.  He is saturating well on room air.  He has not called to schedule follow-up with a primary care provider.  I informed him that he is likely to progress and recurring to his COPD exacerbation if he continues to not get outpatient care and management and that this could be a life threat.  However currently he is satting well in no acute distress and does not warrant inpatient admission again. Patient expressed understanding states he will return if his symptoms worsen.  Again try to reinforce importance of obtaining outpatient follow-up and will attach phone number for community health and wellness clinic.  Clinical Impression:  1. SOB (shortness of breath)      Discharge   Final Clinical Impression(s) / ED Diagnoses Final diagnoses:  SOB (shortness of breath)    Rx / DC Orders  ED Discharge Orders     None         Glyn Ade, MD 12/23/22 2303

## 2022-12-23 NOTE — ED Provider Triage Note (Signed)
Emergency Medicine Provider Triage Evaluation Note  Franklin Chavez , a 61 y.o. male  was evaluated in triage.  Pt complains of shortness of breath started this morning.  Patient was discharged from [facility] yesterday being treated for COPD exacerbation.  He is homeless.  Endorses cough. No fever, chest pain, nausea, vomiting, bowel changes, urinary symptoms.  Review of Systems  Positive: As above Negative: As above  Physical Exam  Ht 6' (1.829 m)   Wt 67.1 kg   BMI 20.07 kg/m  Gen:   Awake, no distress   Resp:  Normal effort  MSK:   Moves extremities without difficulty  Other:    Medical Decision Making  Medically screening exam initiated at 5:59 PM.  Appropriate orders placed.  Franklin Chavez was informed that the remainder of the evaluation will be completed by another provider, this initial triage assessment does not replace that evaluation, and the importance of remaining in the ED until their evaluation is complete.    Franklin Chavez, [place] 12/23/22 1801

## 2022-12-27 LAB — CULTURE, BLOOD (ROUTINE X 2)
Culture: NO GROWTH
Culture: NO GROWTH
Special Requests: ADEQUATE
Special Requests: ADEQUATE

## 2022-12-28 ENCOUNTER — Inpatient Hospital Stay (HOSPITAL_COMMUNITY)
Admission: EM | Admit: 2022-12-28 | Discharge: 2022-12-30 | DRG: 190 | Disposition: A | Discharge status: Home / Self Care | Payer: Medicaid Other | Attending: Internal Medicine | Admitting: Internal Medicine

## 2022-12-28 ENCOUNTER — Emergency Department (HOSPITAL_COMMUNITY): Payer: Medicaid Other

## 2022-12-28 ENCOUNTER — Other Ambulatory Visit: Payer: Self-pay

## 2022-12-28 DIAGNOSIS — J9601 Acute respiratory failure with hypoxia: Secondary | ICD-10-CM

## 2022-12-28 DIAGNOSIS — Z833 Family history of diabetes mellitus: Secondary | ICD-10-CM

## 2022-12-28 DIAGNOSIS — E440 Moderate protein-calorie malnutrition: Secondary | ICD-10-CM | POA: Diagnosis present

## 2022-12-28 DIAGNOSIS — D72829 Elevated white blood cell count, unspecified: Secondary | ICD-10-CM | POA: Diagnosis present

## 2022-12-28 DIAGNOSIS — J441 Chronic obstructive pulmonary disease with (acute) exacerbation: Principal | ICD-10-CM

## 2022-12-28 DIAGNOSIS — Z79899 Other long term (current) drug therapy: Secondary | ICD-10-CM | POA: Diagnosis not present

## 2022-12-28 DIAGNOSIS — Z7951 Long term (current) use of inhaled steroids: Secondary | ICD-10-CM | POA: Diagnosis not present

## 2022-12-28 DIAGNOSIS — K089 Disorder of teeth and supporting structures, unspecified: Secondary | ICD-10-CM | POA: Diagnosis not present

## 2022-12-28 DIAGNOSIS — Z720 Tobacco use: Secondary | ICD-10-CM | POA: Diagnosis not present

## 2022-12-28 DIAGNOSIS — Z5900 Homelessness unspecified: Secondary | ICD-10-CM | POA: Diagnosis not present

## 2022-12-28 DIAGNOSIS — Z6820 Body mass index (BMI) 20.0-20.9, adult: Secondary | ICD-10-CM | POA: Diagnosis not present

## 2022-12-28 DIAGNOSIS — I10 Essential (primary) hypertension: Secondary | ICD-10-CM

## 2022-12-28 DIAGNOSIS — Z1152 Encounter for screening for COVID-19: Secondary | ICD-10-CM | POA: Diagnosis not present

## 2022-12-28 DIAGNOSIS — F411 Generalized anxiety disorder: Secondary | ICD-10-CM | POA: Diagnosis present

## 2022-12-28 DIAGNOSIS — F17200 Nicotine dependence, unspecified, uncomplicated: Secondary | ICD-10-CM | POA: Diagnosis present

## 2022-12-28 DIAGNOSIS — Z8249 Family history of ischemic heart disease and other diseases of the circulatory system: Secondary | ICD-10-CM | POA: Diagnosis not present

## 2022-12-28 DIAGNOSIS — I5032 Chronic diastolic (congestive) heart failure: Secondary | ICD-10-CM | POA: Diagnosis present

## 2022-12-28 DIAGNOSIS — I110 Hypertensive heart disease with heart failure: Secondary | ICD-10-CM | POA: Diagnosis present

## 2022-12-28 DIAGNOSIS — Z91148 Patient's other noncompliance with medication regimen for other reason: Secondary | ICD-10-CM

## 2022-12-28 LAB — BASIC METABOLIC PANEL
Anion gap: 8 (ref 5–15)
BUN: 14 mg/dL (ref 6–20)
CO2: 29 mmol/L (ref 22–32)
Calcium: 8.3 mg/dL — ABNORMAL LOW (ref 8.9–10.3)
Chloride: 99 mmol/L (ref 98–111)
Creatinine, Ser: 0.83 mg/dL (ref 0.61–1.24)
GFR, Estimated: >60 mL/min (ref >60)
Glucose, Bld: 125 mg/dL — ABNORMAL HIGH (ref 70–99)
Potassium: 3.9 mmol/L (ref 3.5–5.1)
Sodium: 136 mmol/L (ref 135–145)

## 2022-12-28 LAB — CBC WITH DIFFERENTIAL/PLATELET
Abs Immature Granulocytes: 0.09 10*3/uL — ABNORMAL HIGH (ref 0.00–0.07)
Basophils Absolute: 0.1 10*3/uL (ref 0.0–0.1)
Basophils Relative: 1 %
Eosinophils Absolute: 0.9 10*3/uL — ABNORMAL HIGH (ref 0.0–0.5)
Eosinophils Relative: 6 %
HCT: 44.5 % (ref 39.0–52.0)
Hemoglobin: 15.2 g/dL (ref 13.0–17.0)
Immature Granulocytes: 1 %
Lymphocytes Relative: 7 %
Lymphs Abs: 1 10*3/uL (ref 0.7–4.0)
MCH: 28.6 pg (ref 26.0–34.0)
MCHC: 34.2 g/dL (ref 30.0–36.0)
MCV: 83.6 fL (ref 80.0–100.0)
Monocytes Absolute: 0.5 10*3/uL (ref 0.1–1.0)
Monocytes Relative: 3 %
Neutro Abs: 12.5 10*3/uL — ABNORMAL HIGH (ref 1.7–7.7)
Neutrophils Relative %: 82 %
Platelets: 272 10*3/uL (ref 150–400)
RBC: 5.32 MIL/uL (ref 4.22–5.81)
RDW: 14.5 % (ref 11.5–15.5)
WBC: 14.9 10*3/uL — ABNORMAL HIGH (ref 4.0–10.5)
nRBC: 0 % (ref 0.0–0.2)

## 2022-12-28 LAB — RESP PANEL BY RT-PCR (RSV, FLU A&B, COVID)  RVPGX2
Influenza A by PCR: NEGATIVE
Influenza B by PCR: NEGATIVE
Resp Syncytial Virus by PCR: NEGATIVE
SARS Coronavirus 2 by RT PCR: NEGATIVE

## 2022-12-28 LAB — CREATININE, SERUM
Creatinine, Ser: 1.08 mg/dL (ref 0.61–1.24)
GFR, Estimated: >60 mL/min (ref >60)

## 2022-12-28 LAB — CBC
HCT: 43.5 % (ref 39.0–52.0)
Hemoglobin: 14.8 g/dL (ref 13.0–17.0)
MCH: 28.7 pg (ref 26.0–34.0)
MCHC: 34 g/dL (ref 30.0–36.0)
MCV: 84.3 fL (ref 80.0–100.0)
Platelets: 260 10*3/uL (ref 150–400)
RBC: 5.16 MIL/uL (ref 4.22–5.81)
RDW: 14.5 % (ref 11.5–15.5)
WBC: 13 10*3/uL — ABNORMAL HIGH (ref 4.0–10.5)
nRBC: 0 % (ref 0.0–0.2)

## 2022-12-28 MED ORDER — ALBUTEROL SULFATE (2.5 MG/3ML) 0.083% IN NEBU
2.5000 mg | INHALATION_SOLUTION | RESPIRATORY_TRACT | Status: AC
  Administered 2022-12-28 – 2022-12-29 (×3): 2.5 mg via RESPIRATORY_TRACT
  Filled 2022-12-28 (×5): qty 3

## 2022-12-28 MED ORDER — GUAIFENESIN ER 600 MG PO TB12
600.0000 mg | ORAL_TABLET | Freq: Two times a day (BID) | ORAL | Status: DC
  Administered 2022-12-28 – 2022-12-30 (×4): 600 mg via ORAL
  Filled 2022-12-28 (×4): qty 1

## 2022-12-28 MED ORDER — IPRATROPIUM-ALBUTEROL 0.5-2.5 (3) MG/3ML IN SOLN
3.0000 mL | Freq: Once | RESPIRATORY_TRACT | Status: AC
  Administered 2022-12-28: 3 mL via RESPIRATORY_TRACT
  Filled 2022-12-28: qty 3

## 2022-12-28 MED ORDER — BUSPIRONE HCL 5 MG PO TABS
5.0000 mg | ORAL_TABLET | Freq: Two times a day (BID) | ORAL | Status: DC
  Administered 2022-12-28 – 2022-12-30 (×4): 5 mg via ORAL
  Filled 2022-12-28 (×4): qty 1

## 2022-12-28 MED ORDER — IPRATROPIUM BROMIDE 0.02 % IN SOLN: 0.5000 mg | RESPIRATORY_TRACT | Status: DC | PRN

## 2022-12-28 MED ORDER — PREDNISONE 20 MG PO TABS
40.0000 mg | ORAL_TABLET | Freq: Every day | ORAL | Status: DC
  Administered 2022-12-30: 40 mg via ORAL
  Filled 2022-12-28: qty 2

## 2022-12-28 MED ORDER — UMECLIDINIUM-VILANTEROL 62.5-25 MCG/ACT IN AEPB
1.0000 | INHALATION_SPRAY | Freq: Every day | RESPIRATORY_TRACT | Status: DC
  Filled 2022-12-28: qty 14

## 2022-12-28 MED ORDER — METHYLPREDNISOLONE SODIUM SUCC 40 MG IJ SOLR
40.0000 mg | Freq: Two times a day (BID) | INTRAMUSCULAR | Status: AC
  Administered 2022-12-29 (×2): 40 mg via INTRAVENOUS
  Filled 2022-12-28 (×2): qty 1

## 2022-12-28 MED ORDER — LOSARTAN POTASSIUM 50 MG PO TABS
100.0000 mg | ORAL_TABLET | Freq: Every day | ORAL | Status: DC
  Administered 2022-12-28 – 2022-12-30 (×3): 100 mg via ORAL
  Filled 2022-12-28: qty 2
  Filled 2022-12-28 (×2): qty 4

## 2022-12-28 MED ORDER — SODIUM CHLORIDE 0.9 % IV SOLN
500.0000 mg | INTRAVENOUS | Status: AC
  Administered 2022-12-28: 500 mg via INTRAVENOUS
  Filled 2022-12-28: qty 5

## 2022-12-28 MED ORDER — ENOXAPARIN SODIUM 30 MG/0.3ML IJ SOSY
30.0000 mg | PREFILLED_SYRINGE | INTRAMUSCULAR | Status: DC
  Administered 2022-12-28: 30 mg via SUBCUTANEOUS
  Filled 2022-12-28: qty 0.3

## 2022-12-28 MED ORDER — AZITHROMYCIN 250 MG PO TABS
500.0000 mg | ORAL_TABLET | Freq: Every day | ORAL | Status: DC
  Administered 2022-12-29 – 2022-12-30 (×2): 500 mg via ORAL
  Filled 2022-12-28 (×3): qty 2

## 2022-12-28 MED ORDER — NICOTINE 14 MG/24HR TD PT24
14.0000 mg | MEDICATED_PATCH | Freq: Every day | TRANSDERMAL | Status: DC
  Filled 2022-12-28 (×3): qty 1

## 2022-12-28 MED ORDER — ISOSORBIDE MONONITRATE ER 60 MG PO TB24
60.0000 mg | ORAL_TABLET | Freq: Every day | ORAL | Status: DC
  Administered 2022-12-28 – 2022-12-30 (×2): 60 mg via ORAL
  Filled 2022-12-28 (×3): qty 1

## 2022-12-28 NOTE — ED Provider Notes (Signed)
[place] EMERGENCY DEPARTMENT AT [facility] Provider Note   CSN: [id number] Arrival date & time: 12/28/22  1804     History  Chief Complaint  Patient presents with   Shortness of Breath    Franklin Chavez is a 62 y.o. male.  Pt is a 62 yo male with a pmhx significant for COPD, polysubstance abuse, htn, and homeless status.  Pt was admitted from 1/16-1/19 for COPD.  Pt said he did not get his meds filled b/c he could not afford the co-pay.  Pt does not stay in a shelter and has been living outside.  He was having more sob this am.  He called EMS and was found to be 84% on RA.  Pt is not normally on O2.  He was given an albuterol neb and 125 mg solumedrol by EMS.  Pt is feeling a little better, but is still very sob.  No f/c.       Home Medications Prior to Admission medications   Medication Sig Start Date End Date Taking? Authorizing Provider  albuterol (VENTOLIN HFA) 108 (90 Base) MCG/ACT inhaler Inhale 1-2 puffs into the lungs every 6 (six) hours as needed for wheezing or shortness of breath. Patient not taking: Reported on 12/20/2022 11/07/22   Rai, Delene Ruffini, MD  busPIRone (BUSPAR) 5 MG tablet Take 1 tablet (5 mg total) by mouth 2 (two) times daily. 11/07/22   Rai, Ripudeep K, MD  fluticasone-salmeterol (ADVAIR DISKUS) 250-50 MCG/ACT AEPB Inhale 1 puff into the lungs in the morning and at bedtime. Patient not taking: Reported on 12/20/2022 11/07/22   Rai, Delene Ruffini, MD  guaiFENesin (MUCINEX) 600 MG 12 hr tablet Take 1 tablet (600 mg total) by mouth 2 (two) times daily. 12/22/22   Albertine Grates, MD  isosorbide mononitrate (IMDUR) 60 MG 24 hr tablet Take 1 tablet (60 mg total) by mouth daily. 12/22/22   Albertine Grates, MD  losartan (COZAAR) 100 MG tablet Take 1 tablet (100 mg total) by mouth daily. 12/22/22   Albertine Grates, MD  predniSONE (DELTASONE) 10 MG tablet Label  & dispense according to the schedule below. 6 Pills PO on day one then, 5 Pills PO on day two, 4 Pills PO on day three,  3Pills PO on day four, 2 Pills PO on day five, 1 Pills PO on day six,  then STOP.  Total of 21 tabs 12/22/22   Albertine Grates, MD  tiotropium (SPIRIVA HANDIHALER) 18 MCG inhalation capsule Place 1 capsule (18 mcg total) into inhaler and inhale daily. Patient not taking: Reported on 12/20/2022 11/07/22 02/05/23  Cathren Harsh, MD      Allergies    Patient has no known allergies.    Review of Systems   Review of Systems  Respiratory:  Positive for cough, shortness of breath and wheezing.   All other systems reviewed and are negative.   Physical Exam Updated Vital Signs BP (!) 154/97 (BP Location: Right Arm)   Pulse 73   Temp 98.7 F (37.1 C) (Oral)   Resp 17   Ht 6' (1.829 m)   Wt 67 kg   SpO2 90%   BMI 20.03 kg/m  Physical Exam Vitals and nursing note reviewed.  Constitutional:      Appearance: He is well-developed.  HENT:     Head: Normocephalic and atraumatic.     Mouth/Throat:     Mouth: Mucous membranes are moist.     Pharynx: Oropharynx is clear.  Eyes:  Extraocular Movements: Extraocular movements intact.     Pupils: Pupils are equal, round, and reactive to light.  Cardiovascular:     Rate and Rhythm: Normal rate and regular rhythm.  Pulmonary:     Effort: Tachypnea present.     Breath sounds: Decreased breath sounds and wheezing present.  Abdominal:     General: Bowel sounds are normal.     Palpations: Abdomen is soft.  Musculoskeletal:        General: Normal range of motion.     Cervical back: Normal range of motion and neck supple.  Skin:    General: Skin is warm.     Capillary Refill: Capillary refill takes less than 2 seconds.  Neurological:     General: No focal deficit present.     Mental Status: He is alert and oriented to person, place, and time.  Psychiatric:        Mood and Affect: Mood normal.        Behavior: Behavior normal.     ED Results / Procedures / Treatments   Labs (all labs ordered are listed, but only abnormal results are  displayed) Labs Reviewed  BASIC METABOLIC PANEL - Abnormal; Notable for the following components:      Result Value   Glucose, Bld 125 (*)    Calcium 8.3 (*)    All other components within normal limits  CBC WITH DIFFERENTIAL/PLATELET - Abnormal; Notable for the following components:   WBC 14.9 (*)    Neutro Abs 12.5 (*)    Eosinophils Absolute 0.9 (*)    Abs Immature Granulocytes 0.09 (*)    All other components within normal limits  RESP PANEL BY RT-PCR (RSV, FLU A&B, COVID)  RVPGX2  HIV ANTIBODY (ROUTINE TESTING W REFLEX)  CBC  CREATININE, SERUM  CBC WITH DIFFERENTIAL/PLATELET  BASIC METABOLIC PANEL    EKG EKG Interpretation  Date/Time:  Thursday December 28 2022 18:18:05 EST Ventricular Rate:  81 PR Interval:  151 QRS Duration: 91 QT Interval:  396 QTC Calculation: 460 R Axis:   83 Text Interpretation: Sinus rhythm Borderline right axis deviation ST elevation suggests acute pericarditis No significant change since last tracing Confirmed by Jacalyn Lefevre [phone]) on 12/28/2022 6:35:13 PM  Radiology DG Chest 2 View  Result Date: 12/28/2022 CLINICAL DATA:  Shortness of breath EXAM: CHEST - 2 VIEW COMPARISON:  Chest x-ray 12/23/2022 FINDINGS: The heart size and mediastinal contours are within normal limits. Both lungs are clear. The visualized skeletal structures are unremarkable. IMPRESSION: No active cardiopulmonary disease. Electronically Signed   By: Darliss Cheney M.D.   On: 12/28/2022 18:40    Procedures Procedures    Medications Ordered in ED Medications  enoxaparin (LOVENOX) injection 30 mg (has no administration in time range)  azithromycin (ZITHROMAX) 500 mg in sodium chloride 0.9 % 250 mL IVPB (has no administration in time range)    Followed by  azithromycin (ZITHROMAX) tablet 500 mg (has no administration in time range)  methylPREDNISolone sodium succinate (SOLU-MEDROL) 40 mg/mL injection 40 mg (has no administration in time range)    Followed by   predniSONE (DELTASONE) tablet 40 mg (has no administration in time range)  ipratropium (ATROVENT) nebulizer solution 0.5 mg (has no administration in time range)  umeclidinium-vilanterol (ANORO ELLIPTA) 62.5-25 MCG/ACT 1 puff (has no administration in time range)  albuterol (PROVENTIL) (5 MG/ML) 0.5% nebulizer solution 2.5 mg (has no administration in time range)  nicotine (NICODERM CQ - dosed in mg/24 hours) patch 14 mg (has no  administration in time range)  guaiFENesin (MUCINEX) 12 hr tablet 600 mg (has no administration in time range)  isosorbide mononitrate (IMDUR) 24 hr tablet 60 mg (has no administration in time range)  losartan (COZAAR) tablet 100 mg (has no administration in time range)  busPIRone (BUSPAR) tablet 5 mg (has no administration in time range)  ipratropium-albuterol (DUONEB) 0.5-2.5 (3) MG/3ML nebulizer solution 3 mL (3 mLs Nebulization Given 12/28/22 1853)    ED Course/ Medical Decision Making/ A&P                             Medical Decision Making Risk Prescription drug management. Decision regarding hospitalization.   This patient presents to the ED for concern of sob, this involves an extensive number of treatment options, and is a complaint that carries with it a high risk of complications and morbidity.  The differential diagnosis includes copd exac, bronchitis, pna, covid/flu/rsv   Co morbidities that complicate the patient evaluation  COPD, polysubstance abuse, htn, and homeless status   Additional history obtained:  Additional history obtained from epic chart review External records from outside source obtained and reviewed including EMS report   Lab Tests:  I Ordered, and personally interpreted labs.  The pertinent results include:  cbc with hgb 14.9, bmp nl, covid/flu neg   Imaging Studies ordered:  I ordered imaging studies including cxr  I independently visualized and interpreted imaging which showed No active cardiopulmonary disease.  I  agree with the radiologist interpretation   Cardiac Monitoring:  The patient was maintained on a cardiac monitor.  I personally viewed and interpreted the cardiac monitored which showed an underlying rhythm of: nsr   Medicines ordered and prescription drug management:  I ordered medication including nebs  for sob  Reevaluation of the patient after these medicines showed that the patient improved I have reviewed the patients home medicines and have made adjustments as needed   Critical Interventions:  Oxygen/nebs   Consultations Obtained:  I requested consultation with the hospitalist (Dr. Claria Dice),  and discussed lab and imaging findings as well as pertinent plan -she will admit  Problem List / ED Course:  COPD exac with hypoxic resp failure:  pt is requiring 2L oxygen to keep sats over 90%.  Pt given solumedrol by EMS.  Pt given several nebs.  Sx have improved, but he still needs oxygen.   Reevaluation:  After the interventions noted above, I reevaluated the patient and found that they have :improved   Social Determinants of Health:  homeless   Dispostion:  After consideration of the diagnostic results and the patients response to treatment, I feel that the patent would benefit from admission.    CRITICAL CARE Performed by: Isla Pence   Total critical care time: 30 minutes  Critical care time was exclusive of separately billable procedures and treating other patients.  Critical care was necessary to treat or prevent imminent or life-threatening deterioration.  Critical care was time spent personally by me on the following activities: development of treatment plan with patient and/or surrogate as well as nursing, discussions with consultants, evaluation of patient's response to treatment, examination of patient, obtaining history from patient or surrogate, ordering and performing treatments and interventions, ordering and review of laboratory studies, ordering  and review of radiographic studies, pulse oximetry and re-evaluation of patient's condition.         Final Clinical Impression(s) / ED Diagnoses Final diagnoses:  COPD exacerbation ([place])  Acute respiratory failure with hypoxia [facility])  Homeless    Rx / DC Orders ED Discharge Orders     None         Isla Pence, MD 12/28/22 2038

## 2022-12-28 NOTE — ED Provider Triage Note (Signed)
Emergency Medicine Provider Triage Evaluation Note  Franklin Chavez , a 62 y.o. male  was evaluated in triage.  Pt complains of shortness of breath.  Pt brought in by EMS from home and was found to be 84% on RA.  He was given neb and Solu-Medrol with some improvement.  Pt was recently discharged from hospital for COPD exacerbation.  Denies chest pain, fever, syncope.   Review of Systems  Positive: As above Negative: As above  Physical Exam  BP (!) 138/94 (BP Location: Right Arm)   Pulse 84   Temp 99 F (37.2 C) (Oral)   Resp 18   Ht 6' (1.829 m)   Wt 67 kg   SpO2 93%   BMI 20.03 kg/m  Gen:   Awake, no distress   Resp:  Tachypneic, expiratory wheezing present; hypoxic on RA, placed on [place]  MSK:   Moves extremities without difficulty  Other:    Medical Decision Making  Medically screening exam initiated at 6:17 PM.  Appropriate orders placed.  Franklin Chavez was informed that the remainder of the evaluation will be completed by another provider, this initial triage assessment does not replace that evaluation, and the importance of remaining in the ED until their evaluation is complete.     Franklin Chavez, [place] 12/28/22 [phone]

## 2022-12-28 NOTE — ED Triage Notes (Signed)
Pt BIB EMS c/o sob from home 84% room air on EMS arrival. 125mg  Solumedrol given enroute.

## 2022-12-28 NOTE — H&P (Signed)
PCP:   [facility]   Chief Complaint:  Shortness of breath  HPI: This is a 62 year old male with history of COPD, hypertension, GAD and homelessness.  Patient presents with complaint of shortness of breath that started earlier today.  He has been wheezing and coughing.  He denies fevers or chills.  His shortness of breath worsened he came to the ER.  Of note the patient was admitted 1/16-1/19 with COPD exacerbation.  He was discharged on prednisone taper and antibiotics. The patient was not able to get them filled.  Review of Systems:  The patient denies anorexia, fever, weight loss,, vision loss, decreased hearing, hoarseness, chest pain, syncope, dyspnea on exertion, peripheral edema, balance deficits, hemoptysis, abdominal pain, melena, hematochezia, severe indigestion/heartburn, hematuria, incontinence, genital sores, muscle weakness, suspicious skin lesions, transient blindness, difficulty walking, depression, unusual weight change, abnormal bleeding, enlarged lymph nodes, angioedema, and breast masses. Positives: SOB, wheeze, cough  Past Medical History: Past Medical History:  Diagnosis Date   COPD (chronic obstructive pulmonary disease) (HCC)    Essential hypertension    GAD (generalized anxiety disorder)    Poor dentition 12/13/2018   Tobacco use disorder 12/13/2018   No past surgical history on file.  Medications: Prior to Admission medications   Medication Sig Start Date End Date Taking? Authorizing Provider  albuterol (VENTOLIN HFA) 108 (90 Base) MCG/ACT inhaler Inhale 1-2 puffs into the lungs every 6 (six) hours as needed for wheezing or shortness of breath. Patient not taking: Reported on 12/20/2022 11/07/22   Rai, Delene Ruffini, MD  busPIRone (BUSPAR) 5 MG tablet Take 1 tablet (5 mg total) by mouth 2 (two) times daily. 11/07/22   Rai, Ripudeep K, MD  fluticasone-salmeterol (ADVAIR DISKUS) 250-50 MCG/ACT AEPB Inhale 1 puff into the lungs in the morning and  at bedtime. Patient not taking: Reported on 12/20/2022 11/07/22   Rai, Delene Ruffini, MD  guaiFENesin (MUCINEX) 600 MG 12 hr tablet Take 1 tablet (600 mg total) by mouth 2 (two) times daily. 12/22/22   Albertine Grates, MD  isosorbide mononitrate (IMDUR) 60 MG 24 hr tablet Take 1 tablet (60 mg total) by mouth daily. 12/22/22   Albertine Grates, MD  losartan (COZAAR) 100 MG tablet Take 1 tablet (100 mg total) by mouth daily. 12/22/22   Albertine Grates, MD  predniSONE (DELTASONE) 10 MG tablet Label  & dispense according to the schedule below. 6 Pills PO on day one then, 5 Pills PO on day two, 4 Pills PO on day three, 3Pills PO on day four, 2 Pills PO on day five, 1 Pills PO on day six,  then STOP.  Total of 21 tabs 12/22/22   Albertine Grates, MD  tiotropium (SPIRIVA HANDIHALER) 18 MCG inhalation capsule Place 1 capsule (18 mcg total) into inhaler and inhale daily. Patient not taking: Reported on 12/20/2022 11/07/22 02/05/23  Cathren Harsh, MD    Allergies:  No Known Allergies  Social History:  reports that he has been smoking. He has never used smokeless tobacco. He reports that he does not drink alcohol and does not use drugs.  Family History: Family History  Problem Relation Age of Onset   Diabetes Mellitus II Mother    Hypertension Mother     Physical Exam: Vitals:   12/28/22 1928 12/28/22 1930 12/28/22 1945 12/28/22 2015  BP: (!) 144/85 (!) 147/87 134/85 (!) 154/97  Pulse: 89 88 93 73  Resp: 20 (!) 28 (!) 32 17  Temp: 98.7 F (37.1 C)  TempSrc: Oral     SpO2: 90% 90% 90% 90%  Weight:      Height:        General:  Alert and oriented times three, well developed and nourished, no acute distress Eyes: PERRLA, pink conjunctiva, no scleral icterus ENT: Moist oral mucosa, neck supple, no thyromegaly Lungs: clear to ascultation, no wheeze, no crackles, no use of accessory muscles Cardiovascular: regular rate and rhythm, no regurgitation, no gallops, no murmurs. No carotid bruits, no JVD Abdomen: soft, positive BS,  non-tender, non-distended, no organomegaly, not an acute abdomen GU: not examined Neuro: CN II - XII grossly intact, sensation intact Musculoskeletal: strength 5/5 all extremities, no clubbing, cyanosis or edema Skin: no rash, no subcutaneous crepitation, no decubitus Psych: appropriate patient   Labs on Admission:  Recent Labs    12/28/22 1908  NA 136  K 3.9  CL 99  CO2 29  GLUCOSE 125*  BUN 14  CREATININE 0.83  CALCIUM 8.3*    Recent Labs    12/28/22 1908  WBC 14.9*  NEUTROABS 12.5*  HGB 15.2  HCT 44.5  MCV 83.6  PLT 272    Micro Results: Recent Results (from the past 240 hour(s))  Resp panel by RT-PCR (RSV, Flu A&B, Covid) Anterior Nasal Swab     Status: None   Collection Time: 12/19/22  7:47 PM   Specimen: Anterior Nasal Swab  Result Value Ref Range Status   SARS Coronavirus 2 by RT PCR NEGATIVE NEGATIVE Final    Comment: (NOTE) SARS-CoV-2 target nucleic acids are NOT DETECTED.  The SARS-CoV-2 RNA is generally detectable in upper respiratory specimens during the acute phase of infection. The lowest concentration of SARS-CoV-2 viral copies this assay can detect is 138 copies/mL. A negative result does not preclude SARS-Cov-2 infection and should not be used as the sole basis for treatment or other patient management decisions. A negative result may occur with  improper specimen collection/handling, submission of specimen other than nasopharyngeal swab, presence of viral mutation(s) within the areas targeted by this assay, and inadequate number of viral copies(<138 copies/mL). A negative result must be combined with clinical observations, patient history, and epidemiological information. The expected result is Negative.  Fact Sheet for Patients:  BloggerCourse.com  Fact Sheet for Healthcare Providers:  SeriousBroker.it  This test is no t yet approved or cleared by the [place] FDA and  has been  authorized for detection and/or diagnosis of SARS-CoV-2 by FDA under an Emergency Use Authorization (EUA). This EUA will remain  in effect (meaning this test can be used) for the duration of the COVID-19 declaration under Section 564(b)(1) of the Act, 21 U.S.C.section 360bbb-3(b)(1), unless the authorization is terminated  or revoked sooner.       Influenza A by PCR NEGATIVE NEGATIVE Final   Influenza B by PCR NEGATIVE NEGATIVE Final    Comment: (NOTE) The Xpert Xpress SARS-CoV-2/FLU/RSV plus assay is intended as an aid in the diagnosis of influenza from Nasopharyngeal swab specimens and should not be used as a sole basis for treatment. Nasal washings and aspirates are unacceptable for Xpert Xpress SARS-CoV-2/FLU/RSV testing.  Fact Sheet for Patients: BloggerCourse.com  Fact Sheet for Healthcare Providers: SeriousBroker.it  This test is not yet approved or cleared by the [place] FDA and has been authorized for detection and/or diagnosis of SARS-CoV-2 by FDA under an Emergency Use Authorization (EUA). This EUA will remain in effect (meaning this test can be used) for the duration of the COVID-19 declaration under Section  564(b)(1) of the Act, 21 U.S.C. section 360bbb-3(b)(1), unless the authorization is terminated or revoked.     Resp Syncytial Virus by PCR NEGATIVE NEGATIVE Final    Comment: (NOTE) Fact Sheet for Patients: EntrepreneurPulse.com.au  Fact Sheet for Healthcare Providers: IncredibleEmployment.be  This test is not yet approved or cleared by the [place] FDA and has been authorized for detection and/or diagnosis of SARS-CoV-2 by FDA under an Emergency Use Authorization (EUA). This EUA will remain in effect (meaning this test can be used) for the duration of the COVID-19 declaration under Section 564(b)(1) of the Act, 21 U.S.C. section 360bbb-3(b)(1), unless the  authorization is terminated or revoked.  Performed at [facility], [place] [address], [place], [place] [address]   Culture, blood (Routine X 2) w Reflex to ID Panel     Status: None   Collection Time: 12/21/22  6:47 PM   Specimen: BLOOD  Result Value Ref Range Status   Specimen Description   Final    BLOOD BLOOD RIGHT ARM AEROBIC BOTTLE ONLY ANAEROBIC BOTTLE ONLY Performed at [facility], [place] [address] [place], [place] [address]    Special Requests   Final    BOTTLES DRAWN AEROBIC AND ANAEROBIC Blood Culture adequate volume Performed at [place] [address] [place], [place] [address]    Culture   Final    NO GROWTH 5 DAYS Performed at [facility], [place] [address] [place], [place] [address]    Report Status 12/27/2022 FINAL  Final  Culture, blood (Routine X 2) w Reflex to ID Panel     Status: None   Collection Time: 12/21/22  6:53 PM   Specimen: BLOOD  Result Value Ref Range Status   Specimen Description   Final    BLOOD BLOOD LEFT HAND AEROBIC BOTTLE ONLY Performed at [place] [address] [place], [place] [address]    Special Requests   Final    BOTTLES DRAWN AEROBIC ONLY Blood Culture adequate volume Performed at [place] [address], [place], [place] [address]    Culture   Final    NO GROWTH 5 DAYS Performed at [facility], [place] [address] [place], [place] [address]    Report Status 12/27/2022 FINAL  Final  Resp panel by RT-PCR (RSV, Flu A&B, Covid) Anterior Nasal Swab     Status: None   Collection Time: 12/28/22  7:09 PM   Specimen: Anterior Nasal Swab  Result Value Ref Range Status   SARS Coronavirus 2 by RT PCR NEGATIVE NEGATIVE Final    Comment: (NOTE) SARS-CoV-2 target nucleic acids are NOT DETECTED.  The SARS-CoV-2 RNA is generally detectable in upper respiratory specimens during the acute phase of infection. The  lowest concentration of SARS-CoV-2 viral copies this assay can detect is 138 copies/mL. A negative result does not preclude SARS-Cov-2 infection and should not be used as the sole basis for treatment or other patient management decisions. A negative result may occur with  improper specimen collection/handling, submission of specimen other than nasopharyngeal swab, presence of viral mutation(s) within the areas targeted by this assay, and inadequate number of viral copies(<138 copies/mL). A negative result must be combined with clinical observations, patient history, and epidemiological information. The expected result is Negative.  Fact Sheet for Patients:  EntrepreneurPulse.com.au  Fact Sheet for Healthcare Providers:  IncredibleEmployment.be  This test is no t yet approved or cleared by the [place] and  has been authorized for detection and/or diagnosis of SARS-CoV-2 by FDA under an Emergency Use Authorization (EUA). This EUA will remain  in effect (meaning this test can be used) for the duration of the COVID-19 declaration under Section 564(b)(1) of the Act, 21 U.S.C.section 360bbb-3(b)(1), unless the authorization is terminated  or revoked sooner.       Influenza A by PCR NEGATIVE NEGATIVE Final   Influenza B by PCR NEGATIVE NEGATIVE Final    Comment: (NOTE) The Xpert Xpress SARS-CoV-2/FLU/RSV plus assay is intended as an aid in the diagnosis of influenza from Nasopharyngeal swab specimens and should not be used as a sole basis for treatment. Nasal washings and aspirates are unacceptable for Xpert Xpress SARS-CoV-2/FLU/RSV testing.  Fact Sheet for Patients: EntrepreneurPulse.com.au  Fact Sheet for Healthcare Providers: IncredibleEmployment.be  This test is not yet approved or cleared by the [place] FDA and has been authorized for detection and/or diagnosis of SARS-CoV-2 by FDA under  an Emergency Use Authorization (EUA). This EUA will remain in effect (meaning this test can be used) for the duration of the COVID-19 declaration under Section 564(b)(1) of the Act, 21 U.S.C. section 360bbb-3(b)(1), unless the authorization is terminated or revoked.     Resp Syncytial Virus by PCR NEGATIVE NEGATIVE Final    Comment: (NOTE) Fact Sheet for Patients: EntrepreneurPulse.com.au  Fact Sheet for Healthcare Providers: IncredibleEmployment.be  This test is not yet approved or cleared by the [place] FDA and has been authorized for detection and/or diagnosis of SARS-CoV-2 by FDA under an Emergency Use Authorization (EUA). This EUA will remain in effect (meaning this test can be used) for the duration of the COVID-19 declaration under Section 564(b)(1) of the Act, 21 U.S.C. section 360bbb-3(b)(1), unless the authorization is terminated or revoked.  Performed at [facility], [place] [address], [place], [place] [address]      Radiological Exams on Admission: DG Chest 2 View  Result Date: 12/28/2022 CLINICAL DATA:  Shortness of breath EXAM: CHEST - 2 VIEW COMPARISON:  Chest x-ray 12/23/2022 FINDINGS: The heart size and mediastinal contours are within normal limits. Both lungs are clear. The visualized skeletal structures are unremarkable. IMPRESSION: No active cardiopulmonary disease. Electronically Signed   By: Ronney Asters M.D.   On: 12/28/2022 18:40    Assessment/Plan Present on Admission:  COPD with acute exacerbation ([place]) -Admit to MedSurg -COPD order set initiated -IV steroids and IV azithromycin -DuoNebs scheduled, albuterol as needed -Oxygen if needed   Tobacco abuse -Nicotine patch ordered   Essential hypertension -Stable, continue Cozaar   GAD -Stable, continue BuSpar   Chronic diastolic CHF (congestive heart failure) (HCC) -Continue Cozaar, Imdur  Leukocytosis -Chronic since 12/10/2018.   Follow-up with PCP  Franklin Chavez 12/28/2022, 8:29 PM

## 2022-12-28 NOTE — ED Notes (Signed)
Patient given sandwich and drink.  

## 2022-12-28 NOTE — ED Notes (Signed)
Pt. Ambulated down the hall and back to his room on 88% room air. Pt. Complaints feeling dizzy and short of breath. Pt. Stating feeling short of breath and placed back on 2 liters of oxygen.

## 2022-12-29 ENCOUNTER — Encounter (HOSPITAL_COMMUNITY): Payer: Self-pay | Admitting: Family Medicine

## 2022-12-29 DIAGNOSIS — E440 Moderate protein-calorie malnutrition: Secondary | ICD-10-CM

## 2022-12-29 DIAGNOSIS — I5032 Chronic diastolic (congestive) heart failure: Secondary | ICD-10-CM

## 2022-12-29 DIAGNOSIS — J441 Chronic obstructive pulmonary disease with (acute) exacerbation: Secondary | ICD-10-CM | POA: Diagnosis not present

## 2022-12-29 DIAGNOSIS — I10 Essential (primary) hypertension: Secondary | ICD-10-CM | POA: Diagnosis not present

## 2022-12-29 DIAGNOSIS — K089 Disorder of teeth and supporting structures, unspecified: Secondary | ICD-10-CM

## 2022-12-29 DIAGNOSIS — Z720 Tobacco use: Secondary | ICD-10-CM | POA: Diagnosis not present

## 2022-12-29 LAB — HIV ANTIBODY (ROUTINE TESTING W REFLEX): HIV Screen 4th Generation wRfx: NONREACTIVE

## 2022-12-29 LAB — BASIC METABOLIC PANEL
Anion gap: 6 (ref 5–15)
BUN: 18 mg/dL (ref 6–20)
CO2: 28 mmol/L (ref 22–32)
Calcium: 8.4 mg/dL — ABNORMAL LOW (ref 8.9–10.3)
Chloride: 102 mmol/L (ref 98–111)
Creatinine, Ser: 0.82 mg/dL (ref 0.61–1.24)
GFR, Estimated: >60 mL/min (ref >60)
Glucose, Bld: 168 mg/dL — ABNORMAL HIGH (ref 70–99)
Potassium: 4.7 mmol/L (ref 3.5–5.1)
Sodium: 136 mmol/L (ref 135–145)

## 2022-12-29 LAB — CBC WITH DIFFERENTIAL/PLATELET
Abs Immature Granulocytes: 0.12 10*3/uL — ABNORMAL HIGH (ref 0.00–0.07)
Basophils Absolute: 0 10*3/uL (ref 0.0–0.1)
Basophils Relative: 0 %
Eosinophils Absolute: 0 10*3/uL (ref 0.0–0.5)
Eosinophils Relative: 0 %
HCT: 41 % (ref 39.0–52.0)
Hemoglobin: 13.8 g/dL (ref 13.0–17.0)
Immature Granulocytes: 1 %
Lymphocytes Relative: 6 %
Lymphs Abs: 0.7 10*3/uL (ref 0.7–4.0)
MCH: 28.3 pg (ref 26.0–34.0)
MCHC: 33.7 g/dL (ref 30.0–36.0)
MCV: 84.2 fL (ref 80.0–100.0)
Monocytes Absolute: 0.2 10*3/uL (ref 0.1–1.0)
Monocytes Relative: 2 %
Neutro Abs: 10.3 10*3/uL — ABNORMAL HIGH (ref 1.7–7.7)
Neutrophils Relative %: 91 %
Platelets: 266 10*3/uL (ref 150–400)
RBC: 4.87 MIL/uL (ref 4.22–5.81)
RDW: 14.6 % (ref 11.5–15.5)
WBC: 11.4 10*3/uL — ABNORMAL HIGH (ref 4.0–10.5)
nRBC: 0 % (ref 0.0–0.2)

## 2022-12-29 MED ORDER — ENOXAPARIN SODIUM 40 MG/0.4ML IJ SOSY
40.0000 mg | PREFILLED_SYRINGE | INTRAMUSCULAR | Status: DC
  Administered 2022-12-29: 40 mg via SUBCUTANEOUS
  Filled 2022-12-29: qty 0.4

## 2022-12-29 MED ORDER — UMECLIDINIUM-VILANTEROL 62.5-25 MCG/ACT IN AEPB
1.0000 | INHALATION_SPRAY | Freq: Every day | RESPIRATORY_TRACT | Status: DC
  Administered 2022-12-30: 1 via RESPIRATORY_TRACT
  Filled 2022-12-29: qty 14

## 2022-12-29 MED ORDER — UMECLIDINIUM-VILANTEROL 62.5-25 MCG/ACT IN AEPB: 1.0000 | INHALATION_SPRAY | Freq: Every day | RESPIRATORY_TRACT | Status: DC

## 2022-12-29 NOTE — Hospital Course (Signed)
Patient is a  62 year old male with history of COPD, hypertension, generalized anxiety disorder and homelessness presented hospital with shortness of breath wheezing and cough without any fever.  He had worsening dyspnea and shortness of breath so he presented to the hospital.  Of note patient was recently admitted and discharged on 12/22/2022 after being treated for COPD exacerbation on prednisone taper and antibiotics.  Patient was however unable to fill those medications so he has not presented to hospital again.In the ED patient had mild leukocytosis.  Influenza COVID and RSV was negative.  Chest x-ray showed no acute infiltrates.   Assessment/Plan  COPD with acute exacerbation -Continue supplemental oxygen nebulizers IV steroids azithromycin.  On 2 L of oxygen by nasal cannula.    Tobacco abuse -Nicotine patch ordered, counseling done   Essential hypertension Continue Cozaar.  Blood pressure seems to be stable at this time   Generalized anxiety disorder. Continue BuSpar   Chronic diastolic CHF (congestive heart failure)  Continue Cozaar.  Decompensated at this time.

## 2022-12-29 NOTE — Evaluation (Signed)
Occupational Therapy Evaluation Patient Details Name: Franklin Chavez MRN: [id number] DOB: Sep 06, 1961 Today's Date: 12/29/2022   History of Present Illness his is a 62 year old male with history of COPD, hypertension, GAD and homelessness.  Patient presents with complaint of shortness of breath that started earlier today.  He has been wheezing and coughing. Patient admitted for COPD exacerbation.   Clinical Impression   [name] is a 62 year old man who is currently experiencing homelessness. He ambulates without a device, is independent, but has had a recent recurrent difficulty with shortness of breathe secondary  to COPD. On evaluation he presented on 2 L [place] at 96%. When O2 removed he maintained between 94-96% at rest. With sitting up and donning socks he briefly dipped to 91% but returned to mid 90s. With short ambulation in hall, approx 100 feet, he was then found tobe 93% but reported dyspnea and sounded wheezy. He exhibited mild balance deficits and decreased activity tolerance. From a functional standpoint he is able to perform functional mobility and ADLs and has no OT needs.      Recommendations for follow up therapy are one component of a multi-disciplinary discharge planning process, led by the attending physician.  Recommendations may be updated based on patient status, additional functional criteria and insurance authorization.   Follow Up Recommendations  No OT follow up     Assistance Recommended at Discharge None  Patient can return home with the following      Functional Status Assessment  Patient has not had a recent decline in their functional status  Equipment Recommendations  None recommended by OT    Recommendations for Other Services       Precautions / Restrictions Precautions Precaution Comments: monitor sats Restrictions Weight Bearing Restrictions: No      Mobility Bed Mobility Overal bed mobility: Modified Independent                   Transfers Overall transfer level: Needs assistance                 General transfer comment: Min guard to ambulate in hallway on RA. Mild unsteadiness that patient able to correct. Patient reports "I lose my balance sometimes"      Balance Overall balance assessment: Mild deficits observed, not formally tested                                         ADL either performed or assessed with clinical judgement   ADL Overall ADL's : Independent                                             Vision Patient Visual Report: No change from baseline       Perception     Praxis      Pertinent Vitals/Pain Pain Assessment Pain Assessment: No/denies pain     Hand Dominance Right   Extremity/Trunk Assessment Upper Extremity Assessment Upper Extremity Assessment: Overall WFL for tasks assessed   Lower Extremity Assessment Lower Extremity Assessment: Defer to PT evaluation   Cervical / Trunk Assessment Cervical / Trunk Assessment: Normal   Communication Communication Communication: No difficulties   Cognition Arousal/Alertness: Awake/alert Behavior During Therapy: WFL for tasks assessed/performed Overall Cognitive Status: Within Functional Limits for  tasks assessed                                       General Comments       Exercises     Shoulder Instructions      Home Living Family/patient expects to be discharged to:: Shelter/Homeless                                        Prior Functioning/Environment Prior Level of Function : Independent/Modified Independent                        OT Problem List: Cardiopulmonary status limiting activity      OT Treatment/Interventions:      OT Goals(Current goals can be found in the care plan section) Acute Rehab OT Goals OT Goal Formulation: All assessment and education complete, DC therapy  OT Frequency:      Co-evaluation               AM-PAC OT "6 Clicks" Daily Activity     Outcome Measure Help from another person eating meals?: None Help from another person taking care of personal grooming?: None Help from another person toileting, which includes using toliet, bedpan, or urinal?: None Help from another person bathing (including washing, rinsing, drying)?: None Help from another person to put on and taking off regular upper body clothing?: None Help from another person to put on and taking off regular lower body clothing?: None 6 Click Score: 24   End of Session Equipment Utilized During Treatment: Oxygen Nurse Communication:  (okay to see)  Activity Tolerance: Patient tolerated treatment well Patient left: in bed;with call bell/phone within reach  OT Visit Diagnosis: Muscle weakness (generalized) (M62.81)                Time: 0950-1001 OT Time Calculation (min): 11 min Charges:  OT General Charges $OT Visit: 1 Visit OT Evaluation $OT Eval Low Complexity: 1 Low  Franklin Chavez, OTR/L [place]  Office [phone]   Franklin Chavez 12/29/2022, 10:07 AM

## 2022-12-29 NOTE — Progress Notes (Signed)
PROGRESS NOTE    Franklin Chavez  PJA:[id number] DOB: Feb 14, 1961 DOA: 12/28/2022 PCP: [facility]    Brief Narrative:  Patient is a  62 year old male with history of COPD, hypertension, generalized anxiety disorder and homelessness presented hospital with shortness of breath wheezing and cough without any fever.  He had worsening dyspnea and shortness of breath so he presented to the hospital.  Of note patient was recently admitted and discharged on 12/22/2022 after being treated for COPD exacerbation on prednisone taper and antibiotics.  Patient was however unable to fill those medications so he has not presented to hospital again.In the ED, patient had mild leukocytosis.  Influenza COVID and RSV was negative.  Chest x-ray showed no acute infiltrates.  Assessment/Plan  COPD with acute exacerbation -Likely secondary to medication noncompliance, homelessness status.  Continue supplemental oxygen, nebulizers IV steroids azithromycin.  On 2 L of oxygen by nasal cannula.  Has dyspnea shortness of breath and wheezing.  Does not feel at his baseline.  Had recurrent admission this month.  TOC consultation for medication assistance, housing needs.  Tobacco abuse -Nicotine patch ordered, counseling done   Essential hypertension Continue Cozaar.  Blood pressure stable at this time   Generalized anxiety disorder. Continue BuSpar   Chronic diastolic CHF (congestive heart failure)  Continue Cozaar.  Appears to be compensated at this time.      DVT prophylaxis: enoxaparin (LOVENOX) injection 40 mg Start: 12/29/22 2200 SCDs Start: 12/28/22 2033   Code Status:     Code Status: Full Code  Disposition: Home likely in 1 to 2 days.  Patient states that he is homeless currently Status is: Inpatient  Remains inpatient appropriate because: COPD exacerbation,   Family Communication: None at bedside  Consultants:  None  Procedures:  None  Antimicrobials:   Azithromycin  Anti-infectives (From admission, onward)    Start     Dose/Rate Route Frequency Ordered Stop   12/29/22 2045  azithromycin (ZITHROMAX) tablet 500 mg       See Hyperspace for full Linked Orders Report.   500 mg Oral Daily 12/28/22 2035 01/02/23 0959   12/28/22 2045  azithromycin (ZITHROMAX) 500 mg in sodium chloride 0.9 % 250 mL IVPB       See Hyperspace for full Linked Orders Report.   500 mg 250 mL/hr over 60 Minutes Intravenous Every 24 hours 12/28/22 2035 12/28/22 2329      Subjective: Today, patient was seen and examined at bedside.  Patient still complains of not feeling well, shortness of breath dyspnea on supplemental oxygen with wheezing.  States that he does not have a place to go.  Objective: Vitals:   12/29/22 0530 12/29/22 0547 12/29/22 0600 12/29/22 0900  BP: 111/68  116/77 104/80  Pulse: 62  (!) 56 69  Resp: 14  14 (!) 24  Temp:  98.6 F (37 C)    TempSrc:  Oral    SpO2: 100%  96% 100%  Weight:      Height:        Intake/Output Summary (Last 24 hours) at 12/29/2022 1004 Last data filed at 12/29/2022 0620 Gross per 24 hour  Intake 250 ml  Output 600 ml  Net -350 ml   Filed Weights   12/28/22 1811  Weight: 67 kg    Physical Examination: Body mass index is 20.03 kg/m.  General:  Average built, not in obvious distress, on nasal cannula oxygen HENT:   No scleral pallor or icterus noted. Oral mucosa is  moist.  Chest:  .  Diminished breath sounds bilaterally CVS: S1 &S2 heard. No murmur.  Regular rate and rhythm. Abdomen: Soft, nontender, nondistended.  Bowel sounds are heard.   Extremities: No cyanosis, clubbing or edema.  Peripheral pulses are palpable. Psych: Alert, awake and oriented, normal mood CNS:  No cranial nerve deficits.  Power equal in all extremities.   Skin: Warm and dry.  No rashes noted.  Data Reviewed:   CBC: Recent Labs  Lab 12/23/22 1831 12/28/22 1908 12/28/22 2152 12/29/22 0617  WBC 12.7* 14.9* 13.0* 11.4*   NEUTROABS 9.5* 12.5*  --  10.3*  HGB 14.6 15.2 14.8 13.8  HCT 43.8 44.5 43.5 41.0  MCV 84.4 83.6 84.3 84.2  PLT 293 272 260 314    Basic Metabolic Panel: Recent Labs  Lab 12/23/22 1831 12/28/22 1908 12/28/22 2152 12/29/22 0617  NA 138 136  --  136  K 4.2 3.9  --  4.7  CL 98 99  --  102  CO2 31 29  --  28  GLUCOSE 114* 125*  --  168*  BUN 17 14  --  18  CREATININE 0.87 0.83 1.08 0.82  CALCIUM 8.4* 8.3*  --  8.4*    Liver Function Tests: No results for input(s): "AST", "ALT", "ALKPHOS", "BILITOT", "PROT", "ALBUMIN" in the last 168 hours.   Radiology Studies: DG Chest 2 View  Result Date: 12/28/2022 CLINICAL DATA:  Shortness of breath EXAM: CHEST - 2 VIEW COMPARISON:  Chest x-ray 12/23/2022 FINDINGS: The heart size and mediastinal contours are within normal limits. Both lungs are clear. The visualized skeletal structures are unremarkable. IMPRESSION: No active cardiopulmonary disease. Electronically Signed   By: Ronney Asters M.D.   On: 12/28/2022 18:40      LOS: 1 day     Flora Lipps, MD [facility] Available via Epic secure chat 7am-7pm After these hours, please refer to coverage provider listed on [email or web address] 12/29/2022, 10:04 AM

## 2022-12-29 NOTE — Evaluation (Signed)
Physical Therapy Evaluation Patient Details Name: Franklin Chavez MRN: [id number] DOB: July 07, 1961 Today's Date: 12/29/2022  History of Present Illness  Pt is 62 year old male admitted 12/28/22 with COPD exacerbation. Pt with history of COPD, hypertension, GAD and homelessness.  Clinical Impression  Pt admitted with above diagnosis. At baseline, pt independent with community ambulation.  Today, pt presenting with mild deficits in mobility, endurance, and balance.  He required cues for safety and min guard for safety.  Pt was able to tolerate some activity on RA but O2 sats decreased when returned to bed on RA - replaced 2 L O2. Pt with good rehab potential and likely no needs at d/c.  Pt currently with functional limitations due to the deficits listed below (see PT Problem List). Pt will benefit from skilled PT to increase their independence and safety with mobility to allow discharge to the venue listed below.          Recommendations for follow up therapy are one component of a multi-disciplinary discharge planning process, led by the attending physician.  Recommendations may be updated based on patient status, additional functional criteria and insurance authorization.  Follow Up Recommendations No PT follow up      Assistance Recommended at Discharge None  Patient can return home with the following       Equipment Recommendations None recommended by PT  Recommendations for Other Services       Functional Status Assessment Patient has had a recent decline in their functional status and demonstrates the ability to make significant improvements in function in a reasonable and predictable amount of time.     Precautions / Restrictions Precautions Precautions: Fall Precaution Comments: monitor sats Restrictions Weight Bearing Restrictions: No      Mobility  Bed Mobility Overal bed mobility: Modified Independent                  Transfers Overall transfer level: Needs  assistance Equipment used: None Transfers: Sit to/from Stand Sit to Stand: Min guard           General transfer comment: min guard for safety    Ambulation/Gait Ambulation/Gait assistance: Min guard Gait Distance (Feet): 150 Feet Assistive device: None Gait Pattern/deviations: Step-through pattern, Drifts right/left Gait velocity: decreased     General Gait Details: Pt ambulating with step through pattern but at slow speed and occasional loss of balance to right requiring min guard.  Pt also mildly drifting R/L with hallway but also tends to keep closing eyes.  Cues to keep eyes open.  Stairs            Wheelchair Mobility    Modified Rankin (Stroke Patients Only)       Balance Overall balance assessment: Needs assistance Sitting-balance support: No upper extremity supported Sitting balance-Leahy Scale: Good     Standing balance support: No upper extremity supported Standing balance-Leahy Scale: Good Standing balance comment: Pt able to ambulate but with deviations                             Pertinent Vitals/Pain Pain Assessment Pain Assessment: No/denies pain    Home Living Family/patient expects to be discharged to:: Shelter/Homeless                   Additional Comments: reports being homeless for past 3 months, previously was staying with sister but reports he cannot go back there.    Prior Function Prior Level of  Function : Independent/Modified Independent             Mobility Comments: Reports community ambulation without AD ADLs Comments: used to work in home improvement, still works a little per pt     Hand Dominance   Dominant Hand: Right    Extremity/Trunk Assessment   Upper Extremity Assessment Upper Extremity Assessment: Overall WFL for tasks assessed    Lower Extremity Assessment Lower Extremity Assessment: Overall WFL for tasks assessed    Cervical / Trunk Assessment Cervical / Trunk Assessment:  Normal  Communication   Communication: No difficulties  Cognition Arousal/Alertness: Awake/alert Behavior During Therapy: WFL for tasks assessed/performed Overall Cognitive Status: Within Functional Limits for tasks assessed                                          General Comments General comments (skin integrity, edema, etc.): Pt was on 2 L with sats 100%.  Tried RA and sats 95% sitting, 93% walking, but down to 90% laying.  Placed back on 2 L post therapy    Exercises     Assessment/Plan    PT Assessment Patient needs continued PT services  PT Problem List Decreased knowledge of use of DME;Decreased activity tolerance;Decreased balance;Cardiopulmonary status limiting activity;Decreased mobility       PT Treatment Interventions DME instruction;Balance training;Gait training;Stair training;Functional mobility training;Patient/family education;Therapeutic activities;Therapeutic exercise;Neuromuscular re-education    PT Goals (Current goals can be found in the Care Plan section)  Acute Rehab PT Goals Patient Stated Goal: breath better PT Goal Formulation: With patient Time For Goal Achievement: 01/05/23 Potential to Achieve Goals: Good Additional Goals Additional Goal #1: Pt will score >19 on DGI to indicate low fall risk    Frequency Min 3X/week     Co-evaluation               AM-PAC PT "6 Clicks" Mobility  Outcome Measure Help needed turning from your back to your side while in a flat bed without using bedrails?: None Help needed moving from lying on your back to sitting on the side of a flat bed without using bedrails?: None Help needed moving to and from a bed to a chair (including a wheelchair)?: A Little Help needed standing up from a chair using your arms (e.g., wheelchair or bedside chair)?: A Little Help needed to walk in hospital room?: A Little Help needed climbing 3-5 steps with a railing? : A Little 6 Click Score: 20    End of  Session   Activity Tolerance: Patient tolerated treatment well Patient left: in bed;with call bell/phone within reach Nurse Communication: Mobility status PT Visit Diagnosis: Other abnormalities of gait and mobility (R26.89)    Time: 8250-5397 PT Time Calculation (min) (ACUTE ONLY): 14 min   Charges:   PT Evaluation $PT Eval Low Complexity: 1 Low          Jedrek Dinovo, PT Acute Rehab [facility] [phone]   Karlton Lemon 12/29/2022, 1:42 PM

## 2022-12-30 DIAGNOSIS — I5032 Chronic diastolic (congestive) heart failure: Secondary | ICD-10-CM | POA: Diagnosis not present

## 2022-12-30 DIAGNOSIS — I10 Essential (primary) hypertension: Secondary | ICD-10-CM | POA: Diagnosis not present

## 2022-12-30 DIAGNOSIS — J441 Chronic obstructive pulmonary disease with (acute) exacerbation: Secondary | ICD-10-CM | POA: Diagnosis not present

## 2022-12-30 DIAGNOSIS — Z720 Tobacco use: Secondary | ICD-10-CM | POA: Diagnosis not present

## 2022-12-30 LAB — CBC
HCT: 42.4 % (ref 39.0–52.0)
Hemoglobin: 14.2 g/dL (ref 13.0–17.0)
MCH: 28.2 pg (ref 26.0–34.0)
MCHC: 33.5 g/dL (ref 30.0–36.0)
MCV: 84.3 fL (ref 80.0–100.0)
Platelets: 293 10*3/uL (ref 150–400)
RBC: 5.03 MIL/uL (ref 4.22–5.81)
RDW: 14.5 % (ref 11.5–15.5)
WBC: 25.3 10*3/uL — ABNORMAL HIGH (ref 4.0–10.5)
nRBC: 0 % (ref 0.0–0.2)

## 2022-12-30 LAB — BASIC METABOLIC PANEL
Anion gap: 8 (ref 5–15)
BUN: 19 mg/dL (ref 6–20)
CO2: 29 mmol/L (ref 22–32)
Calcium: 8.8 mg/dL — ABNORMAL LOW (ref 8.9–10.3)
Chloride: 99 mmol/L (ref 98–111)
Creatinine, Ser: 0.8 mg/dL (ref 0.61–1.24)
GFR, Estimated: >60 mL/min (ref >60)
Glucose, Bld: 139 mg/dL — ABNORMAL HIGH (ref 70–99)
Potassium: 4.8 mmol/L (ref 3.5–5.1)
Sodium: 136 mmol/L (ref 135–145)

## 2022-12-30 LAB — MAGNESIUM: Magnesium: 2.4 mg/dL (ref 1.7–2.4)

## 2022-12-30 NOTE — Plan of Care (Signed)

## 2022-12-30 NOTE — Discharge Summary (Signed)
Physician Discharge Summary  Franklin Chavez SEG:[id number] DOB: August 10, 1961 DOA: 12/28/2022  PCP: [facility]  Admit date: 12/28/2022 Discharge date: 12/30/2022  Admitted From: Home  Discharge disposition: Home   Recommendations for Outpatient Follow-Up:   Follow up with your primary care provider in one week.  Check CBC, BMP, magnesium in the next visit  Discharge Diagnosis:   Principal Problem:   Acute exacerbation of chronic obstructive pulmonary disease (COPD) ([place]) Active Problems:   COPD with acute exacerbation (HCC)   Tobacco abuse   Poor dentition   Essential hypertension   Homelessness   Malnutrition of moderate degree   Chronic diastolic CHF (congestive heart failure) ([place])   Discharge Condition: Improved.  Diet recommendation: Low sodium, heart healthy.    Wound care: None.  Code status: Full.   History of Present Illness:   Patient is a  62 year old male with history of COPD, hypertension, generalized anxiety disorder and homelessness presented hospital with shortness of breath wheezing and cough without any fever.  He had worsening dyspnea and shortness of breath so he presented to the hospital.  Of note patient was recently admitted and discharged on 12/22/2022 after being treated for COPD exacerbation on prednisone taper and antibiotics.  Patient was however unable to fill those medications so he has not presented to hospital again.In the ED, patient had mild leukocytosis.  Influenza COVID and RSV was negative.  Chest x-ray showed no acute infiltrates.    Hospital Course:   Following conditions were addressed during hospitalization as listed below,  COPD with acute exacerbation -Likely secondary to medication noncompliance, homelessness status.  Patient received nebulizers, IV steroids azithromycin during hospitalization.  Was subsequently weaned off oxygen.  Has improved clinically.  Has had recurrent admission this month.  TOC  has assisted the patient for his homelessness needs.  Will need to get his medications from the pharmacy.    Tobacco abuse -Nicotine patch given during hospitalization.  Counseling done   Essential hypertension Continue Cozaar.  Blood pressure stable at this time   Generalized anxiety disorder. Continue BuSpar   Chronic diastolic CHF (congestive heart failure)  Continue Cozaar.  Appears to be compensated at this time.   Disposition.  At this time, patient is stable for disposition home with outpatient PCP follow-up  Medical Consultants:   None.  Procedures:    None Subjective:   Today, patient was seen and examined at bedside.  Feels better than yesterday.  Not on supplemental oxygen.  Overall breathing is improved.  Discharge Exam:   Vitals:   12/29/22 2316 12/30/22 0320  BP: 124/76 134/85  Pulse: 78 68  Resp: 16   Temp: 98.3 F (36.8 C) 98.3 F (36.8 C)  SpO2: 92% 95%   Vitals:   12/29/22 1531 12/29/22 1908 12/29/22 2316 12/30/22 0320  BP: 134/81 123/71 124/76 134/85  Pulse: 73 72 78 68  Resp: 18 16 16    Temp: 97.8 F (36.6 C) 98 F (36.7 C) 98.3 F (36.8 C) 98.3 F (36.8 C)  TempSrc: Oral Oral  Oral  SpO2: 94% 95% 92% 95%  Weight:      Height:        General: Alert awake, not in obvious distress, on room air HENT: pupils equally reacting to light,  No scleral pallor or icterus noted. Oral mucosa is moist.  Chest:    Diminished breath sounds bilaterally. No crackles or wheezes.  CVS: S1 &S2 heard. No murmur.  Regular rate and rhythm. Abdomen:  Soft, nontender, nondistended.  Bowel sounds are heard.   Extremities: No cyanosis, clubbing or edema.  Peripheral pulses are palpable. Psych: Alert, awake and oriented, normal mood CNS:  No cranial nerve deficits.  Power equal in all extremities.   Skin: Warm and dry.  No rashes noted.  The results of significant diagnostics from this hospitalization (including imaging, microbiology, ancillary and laboratory)  are listed below for reference.     Diagnostic Studies:   DG Chest 2 View  Result Date: 12/28/2022 CLINICAL DATA:  Shortness of breath EXAM: CHEST - 2 VIEW COMPARISON:  Chest x-ray 12/23/2022 FINDINGS: The heart size and mediastinal contours are within normal limits. Both lungs are clear. The visualized skeletal structures are unremarkable. IMPRESSION: No active cardiopulmonary disease. Electronically Signed   By: Darliss Cheney M.D.   On: 12/28/2022 18:40     Labs:   Basic Metabolic Panel: Recent Labs  Lab 12/23/22 1831 12/28/22 1908 12/28/22 2152 12/29/22 0617 12/30/22 0624  NA 138 136  --  136 136  K 4.2 3.9  --  4.7 4.8  CL 98 99  --  102 99  CO2 31 29  --  28 29  GLUCOSE 114* 125*  --  168* 139*  BUN 17 14  --  18 19  CREATININE 0.87 0.83 1.08 0.82 0.80  CALCIUM 8.4* 8.3*  --  8.4* 8.8*  MG  --   --   --   --  2.4   GFR Estimated Creatinine Clearance: 93.1 mL/min (by C-G formula based on SCr of 0.8 mg/dL). Liver Function Tests: No results for input(s): "AST", "ALT", "ALKPHOS", "BILITOT", "PROT", "ALBUMIN" in the last 168 hours. No results for input(s): "LIPASE", "AMYLASE" in the last 168 hours. No results for input(s): "AMMONIA" in the last 168 hours. Coagulation profile No results for input(s): "INR", "PROTIME" in the last 168 hours.  CBC: Recent Labs  Lab 12/23/22 1831 12/28/22 1908 12/28/22 2152 12/29/22 0617 12/30/22 0624  WBC 12.7* 14.9* 13.0* 11.4* 25.3*  NEUTROABS 9.5* 12.5*  --  10.3*  --   HGB 14.6 15.2 14.8 13.8 14.2  HCT 43.8 44.5 43.5 41.0 42.4  MCV 84.4 83.6 84.3 84.2 84.3  PLT 293 272 260 266 293   Cardiac Enzymes: No results for input(s): "CKTOTAL", "CKMB", "CKMBINDEX", "TROPONINI" in the last 168 hours. BNP: Invalid input(s): "POCBNP" CBG: No results for input(s): "GLUCAP" in the last 168 hours. D-Dimer No results for input(s): "DDIMER" in the last 72 hours. Hgb A1c No results for input(s): "HGBA1C" in the last 72 hours. Lipid  Profile No results for input(s): "CHOL", "HDL", "LDLCALC", "TRIG", "CHOLHDL", "LDLDIRECT" in the last 72 hours. Thyroid function studies No results for input(s): "TSH", "T4TOTAL", "T3FREE", "THYROIDAB" in the last 72 hours.  Invalid input(s): "FREET3" Anemia work up No results for input(s): "VITAMINB12", "FOLATE", "FERRITIN", "TIBC", "IRON", "RETICCTPCT" in the last 72 hours. Microbiology Recent Results (from the past 240 hour(s))  Culture, blood (Routine X 2) w Reflex to ID Panel     Status: None   Collection Time: 12/21/22  6:47 PM   Specimen: BLOOD  Result Value Ref Range Status   Specimen Description   Final    BLOOD BLOOD RIGHT ARM AEROBIC BOTTLE ONLY ANAEROBIC BOTTLE ONLY Performed at [facility], [address], [place], [place] [address]    Special Requests   Final    BOTTLES DRAWN AEROBIC AND ANAEROBIC Blood Culture adequate volume Performed at [facility], [address]  [place], [place] [address]    Culture   Final    NO GROWTH 5 DAYS Performed at [facility], [place] [address] [place], [place] [address]    Report Status 12/27/2022 FINAL  Final  Culture, blood (Routine X 2) w Reflex to ID Panel     Status: None   Collection Time: 12/21/22  6:53 PM   Specimen: BLOOD  Result Value Ref Range Status   Specimen Description   Final    BLOOD BLOOD LEFT HAND AEROBIC BOTTLE ONLY Performed at [facility] [address], [place], [place] [address]    Special Requests   Final    BOTTLES DRAWN AEROBIC ONLY Blood Culture adequate volume Performed at [facility] [address] [place], [place] [address]    Culture   Final    NO GROWTH 5 DAYS Performed at [facility], [place] [address], [place], [place] [address]    Report Status 12/27/2022 FINAL  Final  Resp panel by RT-PCR (RSV, Flu A&B, Covid) Anterior Nasal Swab     Status: None   Collection Time: 12/28/22  7:09 PM   Specimen:  Anterior Nasal Swab  Result Value Ref Range Status   SARS Coronavirus 2 by RT PCR NEGATIVE NEGATIVE Final    Comment: (NOTE) SARS-CoV-2 target nucleic acids are NOT DETECTED.  The SARS-CoV-2 RNA is generally detectable in upper respiratory specimens during the acute phase of infection. The lowest concentration of SARS-CoV-2 viral copies this assay can detect is 138 copies/mL. A negative result does not preclude SARS-Cov-2 infection and should not be used as the sole basis for treatment or other patient management decisions. A negative result may occur with  improper specimen collection/handling, submission of specimen other than nasopharyngeal swab, presence of viral mutation(s) within the areas targeted by this assay, and inadequate number of viral copies(<138 copies/mL). A negative result must be combined with clinical observations, patient history, and epidemiological information. The expected result is Negative.  Fact Sheet for Patients:  EntrepreneurPulse.com.au  Fact Sheet for Healthcare Providers:  IncredibleEmployment.be  This test is no t yet approved or cleared by the [place] FDA and  has been authorized for detection and/or diagnosis of SARS-CoV-2 by FDA under an Emergency Use Authorization (EUA). This EUA will remain  in effect (meaning this test can be used) for the duration of the COVID-19 declaration under Section 564(b)(1) of the Act, 21 U.S.C.section 360bbb-3(b)(1), unless the authorization is terminated  or revoked sooner.       Influenza A by PCR NEGATIVE NEGATIVE Final   Influenza B by PCR NEGATIVE NEGATIVE Final    Comment: (NOTE) The Xpert Xpress SARS-CoV-2/FLU/RSV plus assay is intended as an aid in the diagnosis of influenza from Nasopharyngeal swab specimens and should not be used as a sole basis for treatment. Nasal washings and aspirates are unacceptable for Xpert Xpress SARS-CoV-2/FLU/RSV testing.  Fact  Sheet for Patients: EntrepreneurPulse.com.au  Fact Sheet for Healthcare Providers: IncredibleEmployment.be  This test is not yet approved or cleared by the [place] FDA and has been authorized for detection and/or diagnosis of SARS-CoV-2 by FDA under an Emergency Use Authorization (EUA). This EUA will remain in effect (meaning this test can be used) for the duration of the COVID-19 declaration under Section 564(b)(1) of the Act, 21 U.S.C. section 360bbb-3(b)(1), unless the authorization is terminated or revoked.     Resp Syncytial Virus by PCR NEGATIVE NEGATIVE Final    Comment: (NOTE) Fact Sheet  for Patients: BloggerCourse.com  Fact Sheet for Healthcare Providers: SeriousBroker.it  This test is not yet approved or cleared by the [place] FDA and has been authorized for detection and/or diagnosis of SARS-CoV-2 by FDA under an Emergency Use Authorization (EUA). This EUA will remain in effect (meaning this test can be used) for the duration of the COVID-19 declaration under Section 564(b)(1) of the Act, 21 U.S.C. section 360bbb-3(b)(1), unless the authorization is terminated or revoked.  Performed at [facility], [address], [place], [place] [address]      Discharge Instructions:   Discharge Instructions     Diet - low sodium heart healthy   Complete by: As directed    Discharge instructions   Complete by: As directed    Follow up with your primary care physician in one week.   Increase activity slowly   Complete by: As directed       Allergies as of 12/30/2022   No Known Allergies      Medication List     TAKE these medications    albuterol 108 (90 Base) MCG/ACT inhaler Commonly known as: VENTOLIN HFA Inhale 1-2 puffs into the lungs every 6 (six) hours as needed for wheezing or shortness of breath. What changed: how much to take    busPIRone 5 MG tablet Commonly known as: BUSPAR Take 1 tablet (5 mg total) by mouth 2 (two) times daily.   fluticasone-salmeterol 250-50 MCG/ACT Aepb Commonly known as: Advair Diskus Inhale 1 puff into the lungs in the morning and at bedtime.   guaiFENesin 600 MG 12 hr tablet Commonly known as: MUCINEX Take 1 tablet (600 mg total) by mouth 2 (two) times daily.   isosorbide mononitrate 60 MG 24 hr tablet Commonly known as: IMDUR Take 1 tablet (60 mg total) by mouth daily.   losartan 100 MG tablet Commonly known as: COZAAR Take 1 tablet (100 mg total) by mouth daily.   predniSONE 10 MG tablet Commonly known as: DELTASONE Label  & dispense according to the schedule below. 6 Pills PO on day one then, 5 Pills PO on day two, 4 Pills PO on day three, 3Pills PO on day four, 2 Pills PO on day five, 1 Pills PO on day six,  then STOP.  Total of 21 tabs   tiotropium 18 MCG inhalation capsule Commonly known as: Spiriva HandiHaler Place 1 capsule (18 mcg total) into inhaler and inhale daily.          Time coordinating discharge: 39 minutes  Signed:  Jermy Couper  [facility] 12/30/2022, 2:10 PM

## 2022-12-30 NOTE — TOC Transition Note (Signed)
Transition of Care [facility])) - CM/SW Discharge Note   Patient Details  Name: KANTON KAMEL MRN: [id number] Date of Birth: October 31, 1961  Transition of Care [facility]) CM/SW Contact:  Rodney Booze, LCSW Phone Number: 12/30/2022, 12:18 PM   Clinical Narrative:     CSW spoke to the patient at the bedside. This CSW explained to the patient about insurance as we can not supply co-pays for his medication at this time. CSW also spoke to the patient about shelters. The patient will be DC today and was advised to get to the shelters as soon as possible. This CSW will provide bus passes to this patient. At this time there are no further needs.   Final next level of care: Other (comment) (Homeless CSW added resouurces) Barriers to Discharge: No Barriers Identified   Patient Goals and CMS Choice CMS Medicare.gov Compare Post Acute Care list provided to:: Patient    Discharge Placement                         Discharge Plan and Services Additional resources added to the After Visit Summary for                                       Social Determinants of Health (SDOH) Interventions SDOH Screenings   Food Insecurity: Food Insecurity Present (12/29/2022)  Housing: High Risk (12/29/2022)  Transportation Needs: No Transportation Needs (12/29/2022)  Utilities: Not At Risk (12/29/2022)  Tobacco Use: High Risk (12/29/2022)     Readmission Risk Interventions    11/01/2022   12:03 PM  Readmission Risk Prevention Plan  Transportation Screening Complete  PCP or Specialist Appt within 5-7 Days Complete  Home Care Screening Complete  Medication Review (RN CM) Complete

## 2022-12-30 NOTE — Progress Notes (Signed)
Mobility Specialist - Progress Note  Pre-mobility: 83 bpm HR, 98% SpO2 During mobility: 83 bpm HR, 95% SpO2 Post-mobility: 70 bpm HR, 96% SPO2   12/30/22 0959  Oxygen Therapy  O2 Device Room Air  Patient Activity (if Appropriate) Ambulating  Mobility  Activity Ambulated independently in hallway  Level of Assistance Standby assist, set-up cues, supervision of patient - no hands on  Assistive Device None  Distance Ambulated (ft) 120 ft  Range of Motion/Exercises Active  Activity Response Tolerated fair  Mobility Referral Yes  $Mobility charge 1 Mobility   Pt was found in bed and agreeable to ambulate. Stated feeling a little dizzy when standing. During ambulation was unsteady and stated that the dizziness was getting worse. Once back in room and supine on bed stated feeling better and that the dizziness was going away. At EOS was left in bed with all necessities in reach.  Ferd Hibbs Mobility Specialist

## 2022-12-30 NOTE — Discharge Instructions (Signed)
[place] [facility]  [phone]  Please Follow up with [facility].

## 2023-01-09 ENCOUNTER — Other Ambulatory Visit: Payer: Self-pay

## 2023-01-09 ENCOUNTER — Encounter (HOSPITAL_COMMUNITY): Payer: Self-pay | Admitting: Internal Medicine

## 2023-01-09 ENCOUNTER — Emergency Department (HOSPITAL_COMMUNITY): Payer: Medicaid Other

## 2023-01-09 ENCOUNTER — Inpatient Hospital Stay (HOSPITAL_COMMUNITY)
Admission: EM | Admit: 2023-01-09 | Discharge: 2023-01-11 | DRG: 190 | Disposition: A | Discharge status: Home / Self Care | Payer: Medicaid Other | Attending: Internal Medicine | Admitting: Internal Medicine

## 2023-01-09 DIAGNOSIS — D72829 Elevated white blood cell count, unspecified: Secondary | ICD-10-CM | POA: Diagnosis present

## 2023-01-09 DIAGNOSIS — I5032 Chronic diastolic (congestive) heart failure: Secondary | ICD-10-CM | POA: Diagnosis present

## 2023-01-09 DIAGNOSIS — R0902 Hypoxemia: Principal | ICD-10-CM

## 2023-01-09 DIAGNOSIS — T380X5A Adverse effect of glucocorticoids and synthetic analogues, initial encounter: Secondary | ICD-10-CM | POA: Diagnosis present

## 2023-01-09 DIAGNOSIS — Z8249 Family history of ischemic heart disease and other diseases of the circulatory system: Secondary | ICD-10-CM

## 2023-01-09 DIAGNOSIS — J9601 Acute respiratory failure with hypoxia: Secondary | ICD-10-CM | POA: Diagnosis present

## 2023-01-09 DIAGNOSIS — F1410 Cocaine abuse, uncomplicated: Secondary | ICD-10-CM | POA: Diagnosis present

## 2023-01-09 DIAGNOSIS — J441 Chronic obstructive pulmonary disease with (acute) exacerbation: Secondary | ICD-10-CM | POA: Diagnosis present

## 2023-01-09 DIAGNOSIS — I110 Hypertensive heart disease with heart failure: Secondary | ICD-10-CM | POA: Diagnosis present

## 2023-01-09 DIAGNOSIS — Z1152 Encounter for screening for COVID-19: Secondary | ICD-10-CM | POA: Diagnosis not present

## 2023-01-09 DIAGNOSIS — F411 Generalized anxiety disorder: Secondary | ICD-10-CM | POA: Diagnosis present

## 2023-01-09 DIAGNOSIS — I10 Essential (primary) hypertension: Secondary | ICD-10-CM | POA: Diagnosis present

## 2023-01-09 DIAGNOSIS — Z720 Tobacco use: Secondary | ICD-10-CM | POA: Diagnosis present

## 2023-01-09 DIAGNOSIS — F17200 Nicotine dependence, unspecified, uncomplicated: Secondary | ICD-10-CM | POA: Diagnosis present

## 2023-01-09 DIAGNOSIS — Z79899 Other long term (current) drug therapy: Secondary | ICD-10-CM | POA: Diagnosis not present

## 2023-01-09 LAB — RAPID URINE DRUG SCREEN, HOSP PERFORMED
Amphetamines: NOT DETECTED
Barbiturates: NOT DETECTED
Benzodiazepines: NOT DETECTED
Cocaine: POSITIVE — AB
Opiates: NOT DETECTED
Tetrahydrocannabinol: POSITIVE — AB

## 2023-01-09 LAB — CBC WITH DIFFERENTIAL/PLATELET
Abs Immature Granulocytes: 0.05 10*3/uL (ref 0.00–0.07)
Basophils Absolute: 0 10*3/uL (ref 0.0–0.1)
Basophils Relative: 0 %
Eosinophils Absolute: 0.6 10*3/uL — ABNORMAL HIGH (ref 0.0–0.5)
Eosinophils Relative: 5 %
HCT: 41.6 % (ref 39.0–52.0)
Hemoglobin: 13.9 g/dL (ref 13.0–17.0)
Immature Granulocytes: 0 %
Lymphocytes Relative: 7 %
Lymphs Abs: 0.8 10*3/uL (ref 0.7–4.0)
MCH: 28.6 pg (ref 26.0–34.0)
MCHC: 33.4 g/dL (ref 30.0–36.0)
MCV: 85.6 fL (ref 80.0–100.0)
Monocytes Absolute: 0.3 10*3/uL (ref 0.1–1.0)
Monocytes Relative: 2 %
Neutro Abs: 9.4 10*3/uL — ABNORMAL HIGH (ref 1.7–7.7)
Neutrophils Relative %: 86 %
Platelets: 305 10*3/uL (ref 150–400)
RBC: 4.86 MIL/uL (ref 4.22–5.81)
RDW: 15.4 % (ref 11.5–15.5)
WBC: 11.1 10*3/uL — ABNORMAL HIGH (ref 4.0–10.5)
nRBC: 0 % (ref 0.0–0.2)

## 2023-01-09 LAB — COMPREHENSIVE METABOLIC PANEL
ALT: 19 U/L (ref 0–44)
AST: 19 U/L (ref 15–41)
Albumin: 3.6 g/dL (ref 3.5–5.0)
Alkaline Phosphatase: 34 U/L — ABNORMAL LOW (ref 38–126)
Anion gap: 7 (ref 5–15)
BUN: 10 mg/dL (ref 6–20)
CO2: 28 mmol/L (ref 22–32)
Calcium: 8.6 mg/dL — ABNORMAL LOW (ref 8.9–10.3)
Chloride: 102 mmol/L (ref 98–111)
Creatinine, Ser: 0.84 mg/dL (ref 0.61–1.24)
GFR, Estimated: >60 mL/min (ref >60)
Glucose, Bld: 124 mg/dL — ABNORMAL HIGH (ref 70–99)
Potassium: 3.7 mmol/L (ref 3.5–5.1)
Sodium: 137 mmol/L (ref 135–145)
Total Bilirubin: 0.6 mg/dL (ref 0.3–1.2)
Total Protein: 6.4 g/dL — ABNORMAL LOW (ref 6.5–8.1)

## 2023-01-09 LAB — TROPONIN I (HIGH SENSITIVITY)
Troponin I (High Sensitivity): 16 ng/L (ref <18)
Troponin I (High Sensitivity): 15 ng/L (ref <18)

## 2023-01-09 LAB — RESP PANEL BY RT-PCR (RSV, FLU A&B, COVID)  RVPGX2
Influenza A by PCR: NEGATIVE
Influenza B by PCR: NEGATIVE
Resp Syncytial Virus by PCR: NEGATIVE
SARS Coronavirus 2 by RT PCR: NEGATIVE

## 2023-01-09 LAB — LACTIC ACID, PLASMA
Lactic Acid, Venous: 0.8 mmol/L (ref 0.5–1.9)
Lactic Acid, Venous: 0.9 mmol/L (ref 0.5–1.9)

## 2023-01-09 LAB — BRAIN NATRIURETIC PEPTIDE: B Natriuretic Peptide: 29.5 pg/mL (ref 0.0–100.0)

## 2023-01-09 MED ORDER — GUAIFENESIN ER 600 MG PO TB12
600.0000 mg | ORAL_TABLET | Freq: Two times a day (BID) | ORAL | Status: DC
  Administered 2023-01-09 – 2023-01-11 (×4): 600 mg via ORAL
  Filled 2023-01-09 (×4): qty 1

## 2023-01-09 MED ORDER — AMLODIPINE BESYLATE 10 MG PO TABS
10.0000 mg | ORAL_TABLET | Freq: Every day | ORAL | Status: DC
  Administered 2023-01-09 – 2023-01-11 (×3): 10 mg via ORAL
  Filled 2023-01-09 (×3): qty 1

## 2023-01-09 MED ORDER — ACETAMINOPHEN 650 MG RE SUPP: 650.0000 mg | Freq: Four times a day (QID) | RECTAL | Status: DC | PRN

## 2023-01-09 MED ORDER — GUAIFENESIN-DM 100-10 MG/5ML PO SYRP
5.0000 mL | ORAL_SOLUTION | ORAL | Status: DC | PRN
  Administered 2023-01-09 – 2023-01-11 (×5): 5 mL via ORAL
  Filled 2023-01-09 (×7): qty 5

## 2023-01-09 MED ORDER — NICOTINE 21 MG/24HR TD PT24
21.0000 mg | MEDICATED_PATCH | Freq: Every day | TRANSDERMAL | Status: DC
  Filled 2023-01-09: qty 1

## 2023-01-09 MED ORDER — BUSPIRONE HCL 5 MG PO TABS
5.0000 mg | ORAL_TABLET | Freq: Two times a day (BID) | ORAL | Status: DC
  Administered 2023-01-09 – 2023-01-11 (×4): 5 mg via ORAL
  Filled 2023-01-09 (×4): qty 1

## 2023-01-09 MED ORDER — LOSARTAN POTASSIUM 50 MG PO TABS
100.0000 mg | ORAL_TABLET | Freq: Every day | ORAL | Status: DC
  Administered 2023-01-09 – 2023-01-11 (×3): 100 mg via ORAL
  Filled 2023-01-09 (×3): qty 2

## 2023-01-09 MED ORDER — IPRATROPIUM-ALBUTEROL 0.5-2.5 (3) MG/3ML IN SOLN: 3.0000 mL | Freq: Three times a day (TID) | RESPIRATORY_TRACT | Status: DC

## 2023-01-09 MED ORDER — ISOSORBIDE MONONITRATE ER 60 MG PO TB24
60.0000 mg | ORAL_TABLET | Freq: Every day | ORAL | Status: DC
  Administered 2023-01-09 – 2023-01-11 (×3): 60 mg via ORAL
  Filled 2023-01-09 (×3): qty 1

## 2023-01-09 MED ORDER — METHYLPREDNISOLONE SODIUM SUCC 40 MG IJ SOLR
40.0000 mg | Freq: Two times a day (BID) | INTRAMUSCULAR | Status: AC
  Administered 2023-01-09 – 2023-01-10 (×2): 40 mg via INTRAVENOUS
  Filled 2023-01-09 (×2): qty 1

## 2023-01-09 MED ORDER — ACETAMINOPHEN 325 MG PO TABS
650.0000 mg | ORAL_TABLET | Freq: Four times a day (QID) | ORAL | Status: DC | PRN
  Administered 2023-01-09 – 2023-01-10 (×2): 650 mg via ORAL
  Filled 2023-01-09 (×2): qty 2

## 2023-01-09 MED ORDER — IPRATROPIUM-ALBUTEROL 0.5-2.5 (3) MG/3ML IN SOLN
3.0000 mL | Freq: Once | RESPIRATORY_TRACT | Status: AC
  Administered 2023-01-09: 3 mL via RESPIRATORY_TRACT
  Filled 2023-01-09: qty 3

## 2023-01-09 MED ORDER — IPRATROPIUM-ALBUTEROL 0.5-2.5 (3) MG/3ML IN SOLN
3.0000 mL | Freq: Four times a day (QID) | RESPIRATORY_TRACT | Status: DC
  Administered 2023-01-09 (×2): 3 mL via RESPIRATORY_TRACT
  Filled 2023-01-09 (×2): qty 3

## 2023-01-09 MED ORDER — PREDNISONE 20 MG PO TABS
40.0000 mg | ORAL_TABLET | Freq: Every day | ORAL | Status: DC
  Administered 2023-01-11: 40 mg via ORAL
  Filled 2023-01-09: qty 2

## 2023-01-09 MED ORDER — METHYLPREDNISOLONE SODIUM SUCC 125 MG IJ SOLR
125.0000 mg | Freq: Once | INTRAMUSCULAR | Status: AC
  Administered 2023-01-09: 125 mg via INTRAVENOUS
  Filled 2023-01-09: qty 2

## 2023-01-09 NOTE — ED Notes (Signed)
On assessment, patient noted to be 87% on room air.  Placed on 2 L [place] at this time

## 2023-01-09 NOTE — ED Provider Notes (Signed)
[place] EMERGENCY DEPARTMENT AT [facility] Provider Note   CSN: [id number] Arrival date & time: 01/09/23  6967     History  No chief complaint on file.   Franklin Chavez is a 62 y.o. male.  The history is provided by the patient and medical records. No language interpreter was used.  Cough Cough characteristics:  Productive Sputum characteristics:  Nondescript Severity:  Moderate Duration:  3 days Timing:  Constant Progression:  Waxing and waning Chronicity:  Recurrent Relieved by:  Nothing Worsened by:  Nothing Ineffective treatments:  None tried Associated symptoms: chills, shortness of breath and wheezing   Associated symptoms: no chest pain, no diaphoresis, no fever, no headaches and no rash        Home Medications Prior to Admission medications   Medication Sig Start Date End Date Taking? Authorizing Provider  albuterol (VENTOLIN HFA) 108 (90 Base) MCG/ACT inhaler Inhale 1-2 puffs into the lungs every 6 (six) hours as needed for wheezing or shortness of breath. Patient taking differently: Inhale 1 puff into the lungs every 6 (six) hours as needed for wheezing or shortness of breath. 11/07/22   Rai, Ripudeep K, MD  busPIRone (BUSPAR) 5 MG tablet Take 1 tablet (5 mg total) by mouth 2 (two) times daily. 11/07/22   Rai, Ripudeep K, MD  fluticasone-salmeterol (ADVAIR DISKUS) 250-50 MCG/ACT AEPB Inhale 1 puff into the lungs in the morning and at bedtime. 11/07/22   Rai, Vernelle Emerald, MD  guaiFENesin (MUCINEX) 600 MG 12 hr tablet Take 1 tablet (600 mg total) by mouth 2 (two) times daily. Patient not taking: Reported on 12/28/2022 12/22/22   Florencia Reasons, MD  isosorbide mononitrate (IMDUR) 60 MG 24 hr tablet Take 1 tablet (60 mg total) by mouth daily. 12/22/22   Florencia Reasons, MD  losartan (COZAAR) 100 MG tablet Take 1 tablet (100 mg total) by mouth daily. 12/22/22   Florencia Reasons, MD  predniSONE (DELTASONE) 10 MG tablet Label  & dispense according to the schedule below. 6 Pills PO on  day one then, 5 Pills PO on day two, 4 Pills PO on day three, 3Pills PO on day four, 2 Pills PO on day five, 1 Pills PO on day six,  then STOP.  Total of 21 tabs Patient not taking: Reported on 12/28/2022 12/22/22   Florencia Reasons, MD  tiotropium (SPIRIVA HANDIHALER) 18 MCG inhalation capsule Place 1 capsule (18 mcg total) into inhaler and inhale daily. Patient not taking: Reported on 12/20/2022 11/07/22 02/05/23  Mendel Corning, MD      Allergies    Patient has no known allergies.    Review of Systems   Review of Systems  Constitutional:  Positive for chills and fatigue. Negative for diaphoresis and fever.  HENT:  Positive for congestion.   Respiratory:  Positive for cough, chest tightness, shortness of breath and wheezing.   Cardiovascular:  Negative for chest pain, palpitations and leg swelling.  Gastrointestinal:  Negative for abdominal pain, constipation, diarrhea and nausea.  Genitourinary:  Negative for dysuria.  Musculoskeletal:  Negative for back pain, neck pain and neck stiffness.  Skin:  Negative for rash and wound.  Neurological:  Negative for headaches.  Psychiatric/Behavioral:  Negative for agitation and confusion.   All other systems reviewed and are negative.   Physical Exam Updated Vital Signs BP (!) 145/97 (BP Location: Right Arm)   Pulse 65   Temp (!) 97.5 F (36.4 C) (Oral)   Resp 15   SpO2 96%  Physical Exam Vitals and nursing note reviewed.  Constitutional:      General: He is not in acute distress.    Appearance: He is well-developed. He is not ill-appearing, toxic-appearing or diaphoretic.  HENT:     Head: Normocephalic and atraumatic.     Nose: Congestion present.     Mouth/Throat:     Mouth: Mucous membranes are moist.     Pharynx: No oropharyngeal exudate or posterior oropharyngeal erythema.  Eyes:     Extraocular Movements: Extraocular movements intact.     Conjunctiva/sclera: Conjunctivae normal.     Pupils: Pupils are equal, round, and reactive to  light.  Cardiovascular:     Rate and Rhythm: Normal rate and regular rhythm.     Heart sounds: No murmur heard. Pulmonary:     Effort: No respiratory distress.     Breath sounds: Wheezing and rhonchi present.  Abdominal:     General: Abdomen is flat.     Palpations: Abdomen is soft.     Tenderness: There is no abdominal tenderness. There is no guarding or rebound.  Musculoskeletal:        General: No swelling.     Cervical back: Neck supple. No tenderness.     Right lower leg: No edema.     Left lower leg: No edema.  Skin:    General: Skin is warm and dry.     Capillary Refill: Capillary refill takes less than 2 seconds.     Findings: No erythema.  Neurological:     Mental Status: He is alert.  Psychiatric:        Mood and Affect: Mood normal.     ED Results / Procedures / Treatments   Labs (all labs ordered are listed, but only abnormal results are displayed) Labs Reviewed  CBC WITH DIFFERENTIAL/PLATELET - Abnormal; Notable for the following components:      Result Value   WBC 11.1 (*)    Neutro Abs 9.4 (*)    Eosinophils Absolute 0.6 (*)    All other components within normal limits  COMPREHENSIVE METABOLIC PANEL - Abnormal; Notable for the following components:   Glucose, Bld 124 (*)    Calcium 8.6 (*)    Total Protein 6.4 (*)    Alkaline Phosphatase 34 (*)    All other components within normal limits  RESP PANEL BY RT-PCR (RSV, FLU A&B, COVID)  RVPGX2  LACTIC ACID, PLASMA  LACTIC ACID, PLASMA  BRAIN NATRIURETIC PEPTIDE  TROPONIN I (HIGH SENSITIVITY)  TROPONIN I (HIGH SENSITIVITY)    EKG EKG Interpretation  Date/Time:  Tuesday January 09 2023 07:41:34 EST Ventricular Rate:  64 PR Interval:  154 QRS Duration: 90 QT Interval:  463 QTC Calculation: 478 R Axis:   88 Text Interpretation: Sinus rhythm when compared to prior, overall similar appearance and ST elevations. No STEMI Confirmed by Antony Blackbird [phone]) on 01/09/2023 7:46:32 AM  Radiology DG  Chest Portable 1 View  Result Date: 01/09/2023 CLINICAL DATA:  Productive cough with shortness of breath and wheezing. EXAM: PORTABLE CHEST 1 VIEW COMPARISON:  Radiographs 12/28/2022 and 12/23/2022.  CT 10/28/2022. FINDINGS: 0753 hours. Two views obtained. The heart size and mediastinal contours are stable. The lungs are clear. There is no pleural effusion or pneumothorax. No acute osseous findings are identified. Telemetry leads overlie the chest. IMPRESSION: Stable chest.  No active cardiopulmonary process. Electronically Signed   By: Richardean Sale M.D.   On: 01/09/2023 08:04    Procedures Procedures    Medications  Ordered in ED Medications  methylPREDNISolone sodium succinate (SOLU-MEDROL) 125 mg/2 mL injection 125 mg (125 mg Intravenous Given 01/09/23 0809)  ipratropium-albuterol (DUONEB) 0.5-2.5 (3) MG/3ML nebulizer solution 3 mL (3 mLs Nebulization Given 01/09/23 0810)    ED Course/ Medical Decision Making/ A&P                             Medical Decision Making Amount and/or Complexity of Data Reviewed Labs: ordered. Radiology: ordered.  Risk Prescription drug management. Decision regarding hospitalization.   JOHNMARK GEIGER is a 62 y.o. male with a past medical history significant for COPD, hypertension, homelessness, CHF, and arthritis who presents with recurrent congestion, cough, shortness breath, and wheezing.  According to EMS report to nursing and patient report, he said cough and shortness breath the last several days.  He reports home inhaler has not been helping.  Patient was having intermittent hypoxia down to 87% with his coughing and wheezing.  He reports no chest pain or palpitations and is not reporting any nausea, vomiting, constipation, diarrhea, or urinary changes.  Denies any chest pressure.  Reports she has had some fatigue and chills but otherwise denies complaints.  On my initial evaluation he is on 3 L nasal cannula to maintain oxygen saturations.  On exam,  lungs had coarse rhonchi and wheezing.  Chest was nontender.  No murmur.  Abdomen nontender.  Legs are nontender and nonedematous.  Patient is sleeping and resting but after waking up was able to answer some questions.  No other focal abnormalities on exam.  EKG appears similar to prior with no STEMI.  Clinically I do suspect he has either a viral URI versus COPD exacerbation in the setting of environmental temperature changes given his documented homelessness.  Will give breathing treatment and a steroid shot and will get labs and imaging.  Will get x-ray to rule out pneumonia.  Will check for viral illness such as COVID/flu/RSV.  Anticipate reassessment and ambulation to see if patient remains hypoxic or will be able to go home for outpatient management of COPD exacerbation.      12:15 PM Patient's workup returned and he does have a mild leukocytosis but otherwise his viral testing was negative.  Troponin negative.  Metabolic panel similar to prior.  BNP nonelevated.  Lactic acid normal.  X-ray did not show pneumonia.  Patient unfortunately still needs oxygen to maintain oxygen saturations in the 90s.  He is still in the 80s when it was removed.  Suspect this is primarily COPD exacerbation he will need readmission.          Final Clinical Impression(s) / ED Diagnoses Final diagnoses:  Hypoxia  COPD exacerbation ([place])    Rx / DC Orders ED Discharge Orders     None       Clinical Impression: 1. Hypoxia   2. COPD exacerbation ([place])     Disposition: Admit  This note was prepared with assistance of Dragon voice recognition software. Occasional wrong-word or sound-a-like substitutions may have occurred due to the inherent limitations of voice recognition software.      Emilene Roma, Gwenyth Allegra, MD 01/09/23 [phone]

## 2023-01-09 NOTE — H&P (Signed)
History and Physical    Patient: Franklin Chavez EZM:[id number] DOB: 04-Nov-1961 DOA: 01/09/2023 DOS: the patient was seen and examined on 01/09/2023 PCP: [facility]  Patient coming from: Home  Chief Complaint:  Chief Complaint  Patient presents with   Shortness of Breath   HPI: Franklin Chavez is a 62 y.o. male with medical history significant of COPD, HTN, tobacco abuse, GAD, cocaine abuse, chronic diastolic HF. Presenting with shortness of breath. He reports that he's had worsening shortness of breath over the last week. He has tried his normal inhalers but they did not help. He ran out of albuterol nebulizers. He said he had productive cough, but no fevers. He had no sick contacts. When his symptoms did not improve this morning, he decided to come to the ED for evaluation.   Review of Systems: As mentioned in the history of present illness. All other systems reviewed and are negative. Past Medical History:  Diagnosis Date   COPD (chronic obstructive pulmonary disease) ([place])    Essential hypertension    GAD (generalized anxiety disorder)    Poor dentition 12/13/2018   Tobacco use disorder 12/13/2018   PSHx No past surgical history on file.  Social History:  reports that he has been smoking. He has never used smokeless tobacco. He endorses coke use.  No Known Allergies  Family History  Problem Relation Age of Onset   Diabetes Mellitus II Mother    Hypertension Mother     Prior to Admission medications   Medication Sig Start Date End Date Taking? Authorizing Provider  albuterol (VENTOLIN HFA) 108 (90 Base) MCG/ACT inhaler Inhale 1-2 puffs into the lungs every 6 (six) hours as needed for wheezing or shortness of breath. Patient taking differently: Inhale 1 puff into the lungs every 6 (six) hours as needed for wheezing or shortness of breath. 11/07/22  Yes Rai, Ripudeep K, MD  amLODipine (NORVASC) 10 MG tablet Take 10 mg by mouth daily.   Yes [provider]  busPIRone (BUSPAR) 5 MG tablet Take 1 tablet (5 mg total) by mouth 2 (two) times daily. 11/07/22  Yes Rai, Ripudeep K, MD  fluticasone-salmeterol (ADVAIR DISKUS) 250-50 MCG/ACT AEPB Inhale 1 puff into the lungs in the morning and at bedtime. 11/07/22  Yes Rai, Ripudeep K, MD  isosorbide mononitrate (IMDUR) 60 MG 24 hr tablet Take 1 tablet (60 mg total) by mouth daily. 12/22/22  Yes Florencia Reasons, MD  losartan (COZAAR) 100 MG tablet Take 1 tablet (100 mg total) by mouth daily. 12/22/22  Yes Florencia Reasons, MD  guaiFENesin (MUCINEX) 600 MG 12 hr tablet Take 1 tablet (600 mg total) by mouth 2 (two) times daily. Patient not taking: Reported on 12/28/2022 12/22/22   Florencia Reasons, MD  predniSONE (DELTASONE) 10 MG tablet Label  & dispense according to the schedule below. 6 Pills PO on day one then, 5 Pills PO on day two, 4 Pills PO on day three, 3Pills PO on day four, 2 Pills PO on day five, 1 Pills PO on day six,  then STOP.  Total of 21 tabs Patient not taking: Reported on 01/09/2023 12/22/22   Florencia Reasons, MD  tiotropium (SPIRIVA HANDIHALER) 18 MCG inhalation capsule Place 1 capsule (18 mcg total) into inhaler and inhale daily. Patient not taking: Reported on 12/20/2022 11/07/22 02/05/23  Mendel Corning, MD    Physical Exam: Vitals:   01/09/23 0816 01/09/23 0930 01/09/23 0945 01/09/23 1117  BP:  (!) 139/103 (!) 144/116  Pulse:  72 70   Resp:  18 (!) 25   Temp:    97.9 F (36.6 C)  TempSrc:      SpO2: 100% 95% 91%    General: 62 y.o. male resting in bed in NAD Eyes: PERRL, normal sclera ENMT: Nares patent w/o discharge, orophaynx clear, dentition poor, ears w/o discharge/lesions/ulcers Neck: Supple, trachea midline Cardiovascular: RRR, +S1, S2, no m/g/r, equal pulses throughout Respiratory: scattered wheeze, slightly increased WOB on 3L GI: BS+, NDNT, no masses noted, no organomegaly noted MSK: No e/c/c Neuro: A&O x 3, no focal deficits Psyc: Appropriate interaction and affect,  calm/cooperative  Data Reviewed:  Results for orders placed or performed during the hospital encounter of 01/09/23 (from the past 24 hour(s))  CBC with Differential     Status: Abnormal   Collection Time: 01/09/23  7:50 AM  Result Value Ref Range   WBC 11.1 (H) 4.0 - 10.5 K/uL   RBC 4.86 4.22 - 5.81 MIL/uL   Hemoglobin 13.9 13.0 - 17.0 g/dL   HCT 41.6 39.0 - 52.0 %   MCV 85.6 80.0 - 100.0 fL   MCH 28.6 26.0 - 34.0 pg   MCHC 33.4 30.0 - 36.0 g/dL   RDW 15.4 11.5 - 15.5 %   Platelets 305 150 - 400 K/uL   nRBC 0.0 0.0 - 0.2 %   Neutrophils Relative % 86 %   Neutro Abs 9.4 (H) 1.7 - 7.7 K/uL   Lymphocytes Relative 7 %   Lymphs Abs 0.8 0.7 - 4.0 K/uL   Monocytes Relative 2 %   Monocytes Absolute 0.3 0.1 - 1.0 K/uL   Eosinophils Relative 5 %   Eosinophils Absolute 0.6 (H) 0.0 - 0.5 K/uL   Basophils Relative 0 %   Basophils Absolute 0.0 0.0 - 0.1 K/uL   Immature Granulocytes 0 %   Abs Immature Granulocytes 0.05 0.00 - 0.07 K/uL  Comprehensive metabolic panel     Status: Abnormal   Collection Time: 01/09/23  7:50 AM  Result Value Ref Range   Sodium 137 135 - 145 mmol/L   Potassium 3.7 3.5 - 5.1 mmol/L   Chloride 102 98 - 111 mmol/L   CO2 28 22 - 32 mmol/L   Glucose, Bld 124 (H) 70 - 99 mg/dL   BUN 10 6 - 20 mg/dL   Creatinine, Ser 0.84 0.61 - 1.24 mg/dL   Calcium 8.6 (L) 8.9 - 10.3 mg/dL   Total Protein 6.4 (L) 6.5 - 8.1 g/dL   Albumin 3.6 3.5 - 5.0 g/dL   AST 19 15 - 41 U/L   ALT 19 0 - 44 U/L   Alkaline Phosphatase 34 (L) 38 - 126 U/L   Total Bilirubin 0.6 0.3 - 1.2 mg/dL   GFR, Estimated >60 >60 mL/min   Anion gap 7 5 - 15  Lactic acid, plasma     Status: None   Collection Time: 01/09/23  7:50 AM  Result Value Ref Range   Lactic Acid, Venous 0.8 0.5 - 1.9 mmol/L  Troponin I (High Sensitivity)     Status: None   Collection Time: 01/09/23  7:50 AM  Result Value Ref Range   Troponin I (High Sensitivity) 16 <18 ng/L  Brain natriuretic peptide     Status: None    Collection Time: 01/09/23  7:50 AM  Result Value Ref Range   B Natriuretic Peptide 29.5 0.0 - 100.0 pg/mL  Resp panel by RT-PCR (RSV, Flu A&B, Covid) Anterior Nasal Swab  Status: None   Collection Time: 01/09/23  7:52 AM   Specimen: Anterior Nasal Swab  Result Value Ref Range   SARS Coronavirus 2 by RT PCR NEGATIVE NEGATIVE   Influenza A by PCR NEGATIVE NEGATIVE   Influenza B by PCR NEGATIVE NEGATIVE   Resp Syncytial Virus by PCR NEGATIVE NEGATIVE  Lactic acid, plasma     Status: None   Collection Time: 01/09/23  9:45 AM  Result Value Ref Range   Lactic Acid, Venous 0.9 0.5 - 1.9 mmol/L  Troponin I (High Sensitivity)     Status: None   Collection Time: 01/09/23  9:45 AM  Result Value Ref Range   Troponin I (High Sensitivity) 15 <18 ng/L   CXR: Stable chest. No active cardiopulmonary process.   Assessment and Plan: COPD exacerbation Acute hypoxic respiratory failure     - admit to inpt, tele     - continue steroids, nebs     - add guaifenesin     - COVID/flu/RSV negative     - wean O2 as able  Tobacco abuse Cocaine abuse     - counsel against further use  HTN     - resume home regimen when confirmed  GAD     - resume home regimen when confirmed  Chronic diastolic HF     - resume home regimen when confirmed  Advance Care Planning:   Code Status: FULL  Consults: None  Family Communication: None at bedside  Severity of Illness: The appropriate patient status for this patient is OBSERVATION. Observation status is judged to be reasonable and necessary in order to provide the required intensity of service to ensure the patient's safety. The patient's presenting symptoms, physical exam findings, and initial radiographic and laboratory data in the context of their medical condition is felt to place them at decreased risk for further clinical deterioration. Furthermore, it is anticipated that the patient will be medically stable for discharge from the hospital within 2  midnights of admission.   Author: Jonnie Finner, DO 01/09/2023 1:17 PM  For on call review [email or web address].

## 2023-01-09 NOTE — ED Notes (Signed)
Pt placed on 2L/M due to pt's SpO2 RA was 87%, pt now reading 94-96%

## 2023-01-09 NOTE — TOC Progression Note (Incomplete)
Transition of Care [name]) - Progression Note    Patient Details  Name: Franklin Chavez MRN: [id number] Date of Birth: 1961/06/12  [facility]) CM/SW [place], RN Phone [phone]  01/09/2023, 4:05 PM  Clinical Narrative:    [facility] acknowledges consult for medication assistance. Patient currently has medicaid which covers the cost of medication with low co-pay. This patient will not qualify for medication assist per TOC. [employer] will only cover patients that are not insured.. CM will follow up with patient to determine needs.  CM at bedside for [facility] consult for medication assist. Patient states that he has 2 insurance cards and the last time his meds were delivered with no cost to the patient/ patient states that he does not know why TOC was consulted for medication assist. Patient states that his meds are not working. CM has advised patient to discuss this issue with the MD.   CM offered substance abuse resources for patient positive for cocaine on admission. Patient is agreeable to substance abuse resources being added to his AVS. Patient refused CAGE aid stating that he is not a drug abuser. He was just doing drugs because that's what he was around and in order to have a place to live. Resources have been attatched to AVS. No other TOC needs noted.   1137 CM has received message from MD stating that patient has no where to go at discharge. CM has advised MD that  TOC can offer a list of shelters and resources for homeless individuals but no resources for immediate housing. Patient to be updated. Resources have been added to AVS.   1209 CM received call from Sonia Side with [facility]. Per [facility] block has been working with the patient to assist with housing . Kennyth Lose states that patient was at a hotel prior to admission and that hotel was paid for by [employer]. CM has made Kennyth Lose aware that [facility] has no resources for immediate housing. Kennyth Lose states that she  will contact [employer] to determine if they will be able to pay for [facility] stay once patient is discharged. Kennyth Lose states that she will follow up with CM.         Expected Discharge Plan and Services                                               Social Determinants of Health (SDOH) Interventions SDOH Screenings   Food Insecurity: Food Insecurity Present (12/29/2022)  Housing: High Risk (12/29/2022)  Transportation Needs: No Transportation Needs (12/29/2022)  Utilities: Not At Risk (12/29/2022)  Tobacco Use: High Risk (12/29/2022)    Readmission Risk Interventions    11/01/2022   12:03 PM  Readmission Risk Prevention Plan  Transportation Screening Complete  PCP or Specialist Appt within 5-7 Days Complete  Home Care Screening Complete  Medication Review (RN CM) Complete

## 2023-01-09 NOTE — ED Triage Notes (Signed)
Patient BIB EMS for evaluation of shortness of breath.  EMS reports that patient is homeless and began to have symptoms during the night.  Uses inhaler without improvement.  Has had cough.  97% on RA for EMS.

## 2023-01-09 NOTE — ED Notes (Signed)
ED TO INPATIENT HANDOFF REPORT  Name/Age/Gender Franklin Chavez 62 y.o. male  Code Status    Code Status Orders  (From admission, onward)           Start     Ordered   01/09/23 1440  Full code  Continuous       Question:  By:  Answer:  Consent: discussion documented in EHR   01/09/23 1440           Code Status History     Date Active Date Inactive Code Status Order ID Comments User Context   12/28/2022 2035 12/30/2022 1925 Full Code [id number]  Gery Pray, MD ED   12/19/2022 2108 12/22/2022 2216 Full Code [id number]  Angie Fava, DO ED   10/30/2022 1610 11/07/2022 1955 Full Code [id number]  Almon Hercules, MD ED   10/23/2022 0901 10/26/2022 1904 Full Code [id number]  Bobette Mo, MD ED   12/10/2018 1716 12/14/2018 1735 Full Code [id number]  Dominica Severin, MD ED       Home/SNF/Other Home  Chief Complaint COPD exacerbation (HCC) [J44.1]  Level of Care/Admitting Diagnosis ED Disposition     ED Disposition  Admit   Condition  --   Comment  Hospital Area: [facility] [[id number]]  Level of Care: Telemetry [5]  Admit to tele based on following criteria: Monitor for Ischemic changes  May admit patient to [facility] or [facility] if equivalent level of care is available:: No  Covid Evaluation: Confirmed COVID Negative  Diagnosis: COPD exacerbation [facility]) [[id number]]  Admitting Physician: Teddy Spike [[id number]]  Attending Physician: Teddy Spike [[id number]]  Certification:: I certify this patient will need inpatient services for at least 2 midnights  Estimated Length of Stay: 2          Medical History Past Medical History:  Diagnosis Date   COPD (chronic obstructive pulmonary disease) (HCC)    Essential hypertension    GAD (generalized anxiety disorder)    Poor dentition 12/13/2018   Tobacco use disorder 12/13/2018    Allergies No Known Allergies  IV Location/Drains/Wounds Patient Lines/Drains/Airways Status     Active  Line/Drains/Airways     Name Placement date Placement time Site Days   Peripheral IV 01/09/23 20 G Left;Posterior Hand 01/09/23  0802  Hand  less than 1   Wound / Incision (Open or Dehisced) 12/11/18 Incision - Open Finger (Comment which one) Left lt middle finger 12/11/18  1125  Finger (Comment which one)  1490            Labs/Imaging Results for orders placed or performed during the hospital encounter of 01/09/23 (from the past 48 hour(s))  CBC with Differential     Status: Abnormal   Collection Time: 01/09/23  7:50 AM  Result Value Ref Range   WBC 11.1 (H) 4.0 - 10.5 K/uL   RBC 4.86 4.22 - 5.81 MIL/uL   Hemoglobin 13.9 13.0 - 17.0 g/dL   HCT 44.3 15.4 - 00.8 %   MCV 85.6 80.0 - 100.0 fL   MCH 28.6 26.0 - 34.0 pg   MCHC 33.4 30.0 - 36.0 g/dL   RDW 67.6 19.5 - 09.3 %   Platelets 305 150 - 400 K/uL   nRBC 0.0 0.0 - 0.2 %   Neutrophils Relative % 86 %   Neutro Abs 9.4 (H) 1.7 - 7.7 K/uL   Lymphocytes Relative 7 %   Lymphs Abs 0.8 0.7 - 4.0 K/uL   Monocytes Relative 2 %  Monocytes Absolute 0.3 0.1 - 1.0 K/uL   Eosinophils Relative 5 %   Eosinophils Absolute 0.6 (H) 0.0 - 0.5 K/uL   Basophils Relative 0 %   Basophils Absolute 0.0 0.0 - 0.1 K/uL   Immature Granulocytes 0 %   Abs Immature Granulocytes 0.05 0.00 - 0.07 K/uL    Comment: Performed at [facility], [place] [address], [place], [place] [address]  Comprehensive metabolic panel     Status: Abnormal   Collection Time: 01/09/23  7:50 AM  Result Value Ref Range   Sodium 137 135 - 145 mmol/L   Potassium 3.7 3.5 - 5.1 mmol/L   Chloride 102 98 - 111 mmol/L   CO2 28 22 - 32 mmol/L   Glucose, Bld 124 (H) 70 - 99 mg/dL    Comment: Glucose reference range applies only to samples taken after fasting for at least 8 hours.   BUN 10 6 - 20 mg/dL   Creatinine, Ser 0.84 0.61 - 1.24 mg/dL   Calcium 8.6 (L) 8.9 - 10.3 mg/dL   Total Protein 6.4 (L) 6.5 - 8.1 g/dL   Albumin 3.6 3.5 - 5.0 g/dL   AST 19 15 - 41  U/L   ALT 19 0 - 44 U/L   Alkaline Phosphatase 34 (L) 38 - 126 U/L   Total Bilirubin 0.6 0.3 - 1.2 mg/dL   GFR, Estimated >60 >60 mL/min    Comment: (NOTE) Calculated using the CKD-EPI Creatinine Equation (2021)    Anion gap 7 5 - 15    Comment: Performed at [facility], [place] [address] [place], [place] [address]  Lactic acid, plasma     Status: None   Collection Time: 01/09/23  7:50 AM  Result Value Ref Range   Lactic Acid, Venous 0.8 0.5 - 1.9 mmol/L    Comment: Performed at [facility], [place] [address], [place], [place] [address]  Troponin I (High Sensitivity)     Status: None   Collection Time: 01/09/23  7:50 AM  Result Value Ref Range   Troponin I (High Sensitivity) 16 <18 ng/L    Comment: (NOTE) Elevated high sensitivity troponin I (hsTnI) values and significant  changes across serial measurements may suggest ACS but many other  chronic and acute conditions are known to elevate hsTnI results.  Refer to the "Links" section for chest pain algorithms and additional  guidance. Performed at [facility], [place] [address] [place], [place] [address]   Brain natriuretic peptide     Status: None   Collection Time: 01/09/23  7:50 AM  Result Value Ref Range   B Natriuretic Peptide 29.5 0.0 - 100.0 pg/mL    Comment: Performed at [facility], [place] [address], [place], [place] [address]  Resp panel by RT-PCR (RSV, Flu A&B, Covid) Anterior Nasal Swab     Status: None   Collection Time: 01/09/23  7:52 AM   Specimen: Anterior Nasal Swab  Result Value Ref Range   SARS Coronavirus 2 by RT PCR NEGATIVE NEGATIVE    Comment: (NOTE) SARS-CoV-2 target nucleic acids are NOT DETECTED.  The SARS-CoV-2 RNA is generally detectable in upper respiratory specimens during the acute phase of infection. The lowest concentration of SARS-CoV-2 viral copies this assay can detect is 138 copies/mL. A negative result does not  preclude SARS-Cov-2 infection and should not be used as the sole basis for treatment or other patient management decisions. A negative result may occur with  improper  specimen collection/handling, submission of specimen other than nasopharyngeal swab, presence of viral mutation(s) within the areas targeted by this assay, and inadequate number of viral copies(<138 copies/mL). A negative result must be combined with clinical observations, patient history, and epidemiological information. The expected result is Negative.  Fact Sheet for Patients:  EntrepreneurPulse.com.au  Fact Sheet for Healthcare Providers:  IncredibleEmployment.be  This test is no t yet approved or cleared by the [place] FDA and  has been authorized for detection and/or diagnosis of SARS-CoV-2 by FDA under an Emergency Use Authorization (EUA). This EUA will remain  in effect (meaning this test can be used) for the duration of the COVID-19 declaration under Section 564(b)(1) of the Act, 21 U.S.C.section 360bbb-3(b)(1), unless the authorization is terminated  or revoked sooner.       Influenza A by PCR NEGATIVE NEGATIVE   Influenza B by PCR NEGATIVE NEGATIVE    Comment: (NOTE) The Xpert Xpress SARS-CoV-2/FLU/RSV plus assay is intended as an aid in the diagnosis of influenza from Nasopharyngeal swab specimens and should not be used as a sole basis for treatment. Nasal washings and aspirates are unacceptable for Xpert Xpress SARS-CoV-2/FLU/RSV testing.  Fact Sheet for Patients: EntrepreneurPulse.com.au  Fact Sheet for Healthcare Providers: IncredibleEmployment.be  This test is not yet approved or cleared by the [place] FDA and has been authorized for detection and/or diagnosis of SARS-CoV-2 by FDA under an Emergency Use Authorization (EUA). This EUA will remain in effect (meaning this test can be used) for the duration of  the COVID-19 declaration under Section 564(b)(1) of the Act, 21 U.S.C. section 360bbb-3(b)(1), unless the authorization is terminated or revoked.     Resp Syncytial Virus by PCR NEGATIVE NEGATIVE    Comment: (NOTE) Fact Sheet for Patients: EntrepreneurPulse.com.au  Fact Sheet for Healthcare Providers: IncredibleEmployment.be  This test is not yet approved or cleared by the [place] FDA and has been authorized for detection and/or diagnosis of SARS-CoV-2 by FDA under an Emergency Use Authorization (EUA). This EUA will remain in effect (meaning this test can be used) for the duration of the COVID-19 declaration under Section 564(b)(1) of the Act, 21 U.S.C. section 360bbb-3(b)(1), unless the authorization is terminated or revoked.  Performed at [facility], [place] [address], [place], [place] [address]   Lactic acid, plasma     Status: None   Collection Time: 01/09/23  9:45 AM  Result Value Ref Range   Lactic Acid, Venous 0.9 0.5 - 1.9 mmol/L    Comment: Performed at [facility], [place] [address], [place], [place] [address]  Troponin I (High Sensitivity)     Status: None   Collection Time: 01/09/23  9:45 AM  Result Value Ref Range   Troponin I (High Sensitivity) 15 <18 ng/L    Comment: (NOTE) Elevated high sensitivity troponin I (hsTnI) values and significant  changes across serial measurements may suggest ACS but many other  chronic and acute conditions are known to elevate hsTnI results.  Refer to the "Links" section for chest pain algorithms and additional  guidance. Performed at [facility], [place] [address] [place], [place] [address]    DG Chest Portable 1 View  Result Date: 01/09/2023 CLINICAL DATA:  Productive cough with shortness of breath and wheezing. EXAM: PORTABLE CHEST 1 VIEW COMPARISON:  Radiographs 12/28/2022 and 12/23/2022.  CT 10/28/2022. FINDINGS: 0753 hours. Two  views obtained. The heart size and mediastinal contours are stable. The lungs are clear. There is no pleural effusion  or pneumothorax. No acute osseous findings are identified. Telemetry leads overlie the chest. IMPRESSION: Stable chest.  No active cardiopulmonary process. Electronically Signed   By: Richardean Sale M.D.   On: 01/09/2023 08:04    Pending Labs Unresulted Labs (From admission, onward)     Start     Ordered   01/09/23 1424  Rapid urine drug screen (hospital performed)  ONCE - STAT,   STAT        01/09/23 1423   Signed and Held  Comprehensive metabolic panel  Tomorrow morning,   R        Signed and Held   Signed and Held  CBC  Tomorrow morning,   R        Signed and Held            Vitals/Pain Today's Vitals   01/09/23 0945 01/09/23 1117 01/09/23 1341 01/09/23 1439  BP: (!) 144/116  (!) 165/97   Pulse: 70  74   Resp: (!) 25  18   Temp:  97.9 F (36.6 C) 98.4 F (36.9 C)   TempSrc:   Oral   SpO2: 91%  95%   PainSc:    0-No pain    Isolation Precautions No active isolations  Medications Medications  ipratropium-albuterol (DUONEB) 0.5-2.5 (3) MG/3ML nebulizer solution 3 mL (3 mLs Nebulization Given 01/09/23 1430)  methylPREDNISolone sodium succinate (SOLU-MEDROL) 125 mg/2 mL injection 125 mg (125 mg Intravenous Given 01/09/23 0809)  ipratropium-albuterol (DUONEB) 0.5-2.5 (3) MG/3ML nebulizer solution 3 mL (3 mLs Nebulization Given 01/09/23 0810)    Mobility walks with person assist

## 2023-01-10 DIAGNOSIS — J441 Chronic obstructive pulmonary disease with (acute) exacerbation: Secondary | ICD-10-CM | POA: Diagnosis not present

## 2023-01-10 LAB — CBC
HCT: 39.2 % (ref 39.0–52.0)
Hemoglobin: 13.1 g/dL (ref 13.0–17.0)
MCH: 28.6 pg (ref 26.0–34.0)
MCHC: 33.4 g/dL (ref 30.0–36.0)
MCV: 85.6 fL (ref 80.0–100.0)
Platelets: 315 10*3/uL (ref 150–400)
RBC: 4.58 MIL/uL (ref 4.22–5.81)
RDW: 15.1 % (ref 11.5–15.5)
WBC: 21 10*3/uL — ABNORMAL HIGH (ref 4.0–10.5)
nRBC: 0 % (ref 0.0–0.2)

## 2023-01-10 LAB — COMPREHENSIVE METABOLIC PANEL
ALT: 16 U/L (ref 0–44)
AST: 17 U/L (ref 15–41)
Albumin: 3.2 g/dL — ABNORMAL LOW (ref 3.5–5.0)
Alkaline Phosphatase: 32 U/L — ABNORMAL LOW (ref 38–126)
Anion gap: 8 (ref 5–15)
BUN: 19 mg/dL (ref 6–20)
CO2: 29 mmol/L (ref 22–32)
Calcium: 8.7 mg/dL — ABNORMAL LOW (ref 8.9–10.3)
Chloride: 97 mmol/L — ABNORMAL LOW (ref 98–111)
Creatinine, Ser: 0.98 mg/dL (ref 0.61–1.24)
GFR, Estimated: >60 mL/min (ref >60)
Glucose, Bld: 113 mg/dL — ABNORMAL HIGH (ref 70–99)
Potassium: 4.2 mmol/L (ref 3.5–5.1)
Sodium: 134 mmol/L — ABNORMAL LOW (ref 135–145)
Total Bilirubin: 0.5 mg/dL (ref 0.3–1.2)
Total Protein: 6.1 g/dL — ABNORMAL LOW (ref 6.5–8.1)

## 2023-01-10 MED ORDER — IPRATROPIUM-ALBUTEROL 0.5-2.5 (3) MG/3ML IN SOLN
3.0000 mL | Freq: Three times a day (TID) | RESPIRATORY_TRACT | Status: DC
  Administered 2023-01-10: 3 mL via RESPIRATORY_TRACT

## 2023-01-10 MED ORDER — ALBUTEROL SULFATE (2.5 MG/3ML) 0.083% IN NEBU
2.5000 mg | INHALATION_SOLUTION | RESPIRATORY_TRACT | Status: DC | PRN
  Filled 2023-01-10: qty 3

## 2023-01-10 MED ORDER — REVEFENACIN 175 MCG/3ML IN SOLN
175.0000 ug | Freq: Every day | RESPIRATORY_TRACT | Status: DC
  Filled 2023-01-10 (×2): qty 3

## 2023-01-10 MED ORDER — TIZANIDINE HCL 4 MG PO TABS
2.0000 mg | ORAL_TABLET | Freq: Once | ORAL | Status: AC
  Administered 2023-01-10: 2 mg via ORAL
  Filled 2023-01-10: qty 1

## 2023-01-10 MED ORDER — IPRATROPIUM-ALBUTEROL 0.5-2.5 (3) MG/3ML IN SOLN
3.0000 mL | Freq: Three times a day (TID) | RESPIRATORY_TRACT | Status: DC
  Filled 2023-01-10: qty 3

## 2023-01-10 MED ORDER — ARFORMOTEROL TARTRATE 15 MCG/2ML IN NEBU
15.0000 ug | INHALATION_SOLUTION | Freq: Two times a day (BID) | RESPIRATORY_TRACT | Status: DC
  Administered 2023-01-10: 15 ug via RESPIRATORY_TRACT
  Filled 2023-01-10 (×3): qty 2

## 2023-01-10 NOTE — Progress Notes (Signed)
PROGRESS NOTE  Franklin Chavez  TSV:[id number] DOB: [birth year] DOA: 01/09/2023 PCP: [facility]   Brief Narrative: Patient is a 62 year old male with history of COPD, hypertension, tobacco abuse, GAD, cocaine abuse, chronic diastolic CHF who presented with shortness of breath.  Reported out of inhalers at home.  Also complained of productive cough.  On presentation ,he was hypoxic and put on 3 L of oxygen.  Currently being managed for COPD exacerbation  Assessment & Plan:  Principal Problem:   COPD exacerbation ([place]) Active Problems:   Tobacco abuse   HTN (hypertension)   Acute respiratory failure with hypoxia (HCC)   Chronic diastolic CHF (congestive heart failure) (HCC)   Cocaine abuse (HCC)   GAD (generalized anxiety disorder)    Acute COPD exacerbation/acute hypoxic respiratory failure: Hypoxic on arrival.  Currently on 3 L We will continue to try to wean.  Continue steroids, nebulization treatment, mucolytics. RSV, COVID/flu negative. He takes inhalers at home but said that they are not working.  He is supposed to see pulmonology as an outpatient.  Leukocytosis: This is most likely from steroids.  Continue to monitor  Tobacco use/cocaine abuse: Counseled for cessation.  UDS positive for cocaine, THC.  Continue nicotine patch.  He says he is trying to quit smoking  Hypertension: Currently blood pressure stable.  Continue home medications: Amlodipine, Imdur, losartan  GAD: Continue home medication, on buspirone  Chronic diastolic CHF: Currently appears euvolemic BNP normal  Weakness: Consulted PT        DVT prophylaxis:SCDs Start: 01/09/23 1616     Code Status: Full Code  Family Communication: None  Patient status:Inpatient  Patient is from :Home  Anticipated discharge PQ:ZRAQ  Estimated DC date: 1 to 2 days   Consultants: None  Procedures:None  Antimicrobials:  Anti-infectives (From admission, onward)    None        Subjective:  Patient seen and examined at bedside today.  Hemodynamically stable.  Still on 3 L.  Complains of weakness on minimal exertion. Auscultation did not reveal significant wheezing.  Concerned about placement and says he does not have place to stay.  Requesting oxygen supply on discharge  Objective: Vitals:   01/09/23 1847 01/09/23 1917 01/09/23 2027 01/10/23 0353  BP: (!) 144/98  (!) 143/87 136/82  Pulse: 92  91 78  Resp: 20  16 18   Temp: (!) 97.5 F (36.4 C)  98.5 F (36.9 C) 98.2 F (36.8 C)  TempSrc: Oral  Oral Oral  SpO2: 98% 91% 100% 95%  Weight:      Height:       No intake or output data in the 24 hours ending 01/10/23 1112 Filed Weights   01/09/23 1629  Weight: 68 kg    Examination:  General exam: Overall comfortable, not in distress HEENT: PERRL Respiratory system:  no wheezes or crackles .  Diminished sounds bilaterally Cardiovascular system: S1 & S2 heard, RRR.  Gastrointestinal system: Abdomen is nondistended, soft and nontender. Central nervous system: Alert and oriented Extremities: No edema, no clubbing ,no cyanosis Skin: No rashes, no ulcers,no icterus     Data Reviewed: I have personally reviewed following labs and imaging studies  CBC: Recent Labs  Lab 01/09/23 0750 01/10/23 0650  WBC 11.1* 21.0*  NEUTROABS 9.4*  --   HGB 13.9 13.1  HCT 41.6 39.2  MCV 85.6 85.6  PLT 305 762   Basic Metabolic Panel: Recent Labs  Lab 01/09/23 0750 01/10/23 0650  NA 137 134*  K  3.7 4.2  CL 102 97*  CO2 28 29  GLUCOSE 124* 113*  BUN 10 19  CREATININE 0.84 0.98  CALCIUM 8.6* 8.7*     Recent Results (from the past 240 hour(s))  Resp panel by RT-PCR (RSV, Flu A&B, Covid) Anterior Nasal Swab     Status: None   Collection Time: 01/09/23  7:52 AM   Specimen: Anterior Nasal Swab  Result Value Ref Range Status   SARS Coronavirus 2 by RT PCR NEGATIVE NEGATIVE Final    Comment: (NOTE) SARS-CoV-2 target nucleic acids are NOT  DETECTED.  The SARS-CoV-2 RNA is generally detectable in upper respiratory specimens during the acute phase of infection. The lowest concentration of SARS-CoV-2 viral copies this assay can detect is 138 copies/mL. A negative result does not preclude SARS-Cov-2 infection and should not be used as the sole basis for treatment or other patient management decisions. A negative result may occur with  improper specimen collection/handling, submission of specimen other than nasopharyngeal swab, presence of viral mutation(s) within the areas targeted by this assay, and inadequate number of viral copies(<138 copies/mL). A negative result must be combined with clinical observations, patient history, and epidemiological information. The expected result is Negative.  Fact Sheet for Patients:  EntrepreneurPulse.com.au  Fact Sheet for Healthcare Providers:  IncredibleEmployment.be  This test is no t yet approved or cleared by the [place] FDA and  has been authorized for detection and/or diagnosis of SARS-CoV-2 by FDA under an Emergency Use Authorization (EUA). This EUA will remain  in effect (meaning this test can be used) for the duration of the COVID-19 declaration under Section 564(b)(1) of the Act, 21 U.S.C.section 360bbb-3(b)(1), unless the authorization is terminated  or revoked sooner.       Influenza A by PCR NEGATIVE NEGATIVE Final   Influenza B by PCR NEGATIVE NEGATIVE Final    Comment: (NOTE) The Xpert Xpress SARS-CoV-2/FLU/RSV plus assay is intended as an aid in the diagnosis of influenza from Nasopharyngeal swab specimens and should not be used as a sole basis for treatment. Nasal washings and aspirates are unacceptable for Xpert Xpress SARS-CoV-2/FLU/RSV testing.  Fact Sheet for Patients: EntrepreneurPulse.com.au  Fact Sheet for Healthcare Providers: IncredibleEmployment.be  This test is not yet  approved or cleared by the [place] FDA and has been authorized for detection and/or diagnosis of SARS-CoV-2 by FDA under an Emergency Use Authorization (EUA). This EUA will remain in effect (meaning this test can be used) for the duration of the COVID-19 declaration under Section 564(b)(1) of the Act, 21 U.S.C. section 360bbb-3(b)(1), unless the authorization is terminated or revoked.     Resp Syncytial Virus by PCR NEGATIVE NEGATIVE Final    Comment: (NOTE) Fact Sheet for Patients: EntrepreneurPulse.com.au  Fact Sheet for Healthcare Providers: IncredibleEmployment.be  This test is not yet approved or cleared by the [place] FDA and has been authorized for detection and/or diagnosis of SARS-CoV-2 by FDA under an Emergency Use Authorization (EUA). This EUA will remain in effect (meaning this test can be used) for the duration of the COVID-19 declaration under Section 564(b)(1) of the Act, 21 U.S.C. section 360bbb-3(b)(1), unless the authorization is terminated or revoked.  Performed at [facility], [place] [address] [place], [place] [address]      Radiology Studies: DG Chest Portable 1 View  Result Date: 01/09/2023 CLINICAL DATA:  Productive cough with shortness of breath and wheezing. EXAM: PORTABLE CHEST 1 VIEW COMPARISON:  Radiographs 12/28/2022 and 12/23/2022.  CT 10/28/2022. FINDINGS: [phone]  hours. Two views obtained. The heart size and mediastinal contours are stable. The lungs are clear. There is no pleural effusion or pneumothorax. No acute osseous findings are identified. Telemetry leads overlie the chest. IMPRESSION: Stable chest.  No active cardiopulmonary process. Electronically Signed   By: Richardean Sale M.D.   On: 01/09/2023 08:04    Scheduled Meds:  amLODipine  10 mg Oral Daily   busPIRone  5 mg Oral BID   guaiFENesin  600 mg Oral BID   ipratropium-albuterol  3 mL Nebulization TID   isosorbide  mononitrate  60 mg Oral Daily   losartan  100 mg Oral Daily   nicotine  21 mg Transdermal Daily   [START ON 01/11/2023] predniSONE  40 mg Oral Q breakfast   Continuous Infusions:   LOS: 1 day   Shelly Coss, MD [facility] P2/06/2023, 11:12 AM

## 2023-01-11 ENCOUNTER — Other Ambulatory Visit (HOSPITAL_COMMUNITY): Payer: Self-pay

## 2023-01-11 DIAGNOSIS — J441 Chronic obstructive pulmonary disease with (acute) exacerbation: Secondary | ICD-10-CM | POA: Diagnosis not present

## 2023-01-11 LAB — CBC
HCT: 39.4 % (ref 39.0–52.0)
Hemoglobin: 13.2 g/dL (ref 13.0–17.0)
MCH: 28.8 pg (ref 26.0–34.0)
MCHC: 33.5 g/dL (ref 30.0–36.0)
MCV: 86 fL (ref 80.0–100.0)
Platelets: 317 10*3/uL (ref 150–400)
RBC: 4.58 MIL/uL (ref 4.22–5.81)
RDW: 15.1 % (ref 11.5–15.5)
WBC: 20.1 10*3/uL — ABNORMAL HIGH (ref 4.0–10.5)
nRBC: 0 % (ref 0.0–0.2)

## 2023-01-11 MED ORDER — PREDNISONE 20 MG PO TABS
40.0000 mg | ORAL_TABLET | Freq: Every day | ORAL | 0 refills | Status: AC
  Filled 2023-01-11: qty 6, 3d supply, fill #0

## 2023-01-11 NOTE — Discharge Summary (Signed)
Physician Discharge Summary  Franklin Chavez RXV:[id number] DOB: 09/09/1961 DOA: 01/09/2023  [facility]  Admit date: 01/09/2023 Discharge date: 01/11/2023  Admitted From: Home Disposition:  Home  Discharge Condition:Stable CODE STATUS:FULL Diet recommendation: Heart Healthy  Brief/Interim Summary: Patient is a 62 year old male with history of COPD, hypertension, tobacco abuse, GAD, cocaine abuse, chronic diastolic CHF who presented with shortness of breath.  Reported out of inhalers at home.  Also complained of productive cough.  On presentation ,he was hypoxic and put on 3 L of oxygen.  Patient has significantly improved symptomatically today.  He is free of wheezing, on room air.  Lungs are clear to auscultation.  Desperately wants to go home today.  Discharged on 3 more days of prednisone.  Has been counseled about stopping smoking and cocaine  Following problems were addressed during the hospitalization:  Acute COPD exacerbation/acute hypoxic respiratory failure: Hypoxic on arrival, requiring 3 L of oxygen. RSV, COVID/flu negative. He takes inhalers at home which we will continue.  Prescribed prednisone for 3 more days.  He remains on room air.  He is supposed to see pulmonology as an outpatient.   Leukocytosis: This is most likely from steroids.  Check CBC in a week.  Low suspicion for infectious etiology   Tobacco use/cocaine abuse: Counseled for cessation.  UDS positive for cocaine, THC.  Continue nicotine patch.  He says he is trying to quit smoking   Hypertension: Currently blood pressure stable.  Continue home medications: Amlodipine, Imdur, losartan   GAD: Continue home medication, on buspirone   Chronic diastolic CHF: Currently appears euvolemic ,BNP normal   Weakness: Resolved  Discharge Diagnoses:  Principal Problem:   COPD exacerbation ([facility]) Active Problems:   Tobacco abuse   HTN (hypertension)   Acute respiratory failure with hypoxia  (HCC)   Chronic diastolic CHF (congestive heart failure) (HCC)   Cocaine abuse (HCC)   GAD (generalized anxiety disorder)    Discharge Instructions  Discharge Instructions     Diet - low sodium heart healthy   Complete by: As directed    Discharge instructions   Complete by: As directed    1)Take prescribed medications as instructed 2)Follow up with your PCP in a week.Do a CBC test to check your white cell count during the follow up. 3)Follow up with pulmonology as an outpatient 4)Stop smoking and substance abuse   Increase activity slowly   Complete by: As directed       Allergies as of 01/11/2023   No Known Allergies      Medication List     TAKE these medications    albuterol 108 (90 Base) MCG/ACT inhaler Commonly known as: VENTOLIN HFA Inhale 1-2 puffs into the lungs every 6 (six) hours as needed for wheezing or shortness of breath. What changed: how much to take   amLODipine 10 MG tablet Commonly known as: NORVASC Take 10 mg by mouth daily.   busPIRone 5 MG tablet Commonly known as: BUSPAR Take 1 tablet (5 mg total) by mouth 2 (two) times daily.   fluticasone-salmeterol 250-50 MCG/ACT Aepb Commonly known as: Advair Diskus Inhale 1 puff into the lungs in the morning and at bedtime.   guaiFENesin 600 MG 12 hr tablet Commonly known as: MUCINEX Take 1 tablet (600 mg total) by mouth 2 (two) times daily.   isosorbide mononitrate 60 MG 24 hr tablet Commonly known as: IMDUR Take 1 tablet (60 mg total) by mouth daily.   losartan 100 MG tablet  Commonly known as: COZAAR Take 1 tablet (100 mg total) by mouth daily.   predniSONE 20 MG tablet Commonly known as: DELTASONE Take 2 tablets (40 mg total) by mouth daily with breakfast for 3 days. Start taking on: January 12, 2023 What changed:  medication strength how much to take how to take this when to take this additional instructions   tiotropium 18 MCG inhalation capsule Commonly known as: Spiriva  HandiHaler Place 1 capsule (18 mcg total) into inhaler and inhale daily.        [facility]up [facility] Schedule an appointment as soon as possible for a visit in 1 week(s).   Contact information: [place] [address] [phone]                No Known Allergies  Consultations:  None  Procedures/Studies: DG Chest Portable 1 View  Result Date: 01/09/2023 CLINICAL DATA:  Productive cough with shortness of breath and wheezing. EXAM: PORTABLE CHEST 1 VIEW COMPARISON:  Radiographs 12/28/2022 and 12/23/2022.  CT 10/28/2022. FINDINGS: 0753 hours. Two views obtained. The heart size and mediastinal contours are stable. The lungs are clear. There is no pleural effusion or pneumothorax. No acute osseous findings are identified. Telemetry leads overlie the chest. IMPRESSION: Stable chest.  No active cardiopulmonary process. Electronically Signed   By: Richardean Sale M.D.   On: 01/09/2023 08:04   DG Chest 2 View  Result Date: 12/28/2022 CLINICAL DATA:  Shortness of breath EXAM: CHEST - 2 VIEW COMPARISON:  Chest x-ray 12/23/2022 FINDINGS: The heart size and mediastinal contours are within normal limits. Both lungs are clear. The visualized skeletal structures are unremarkable. IMPRESSION: No active cardiopulmonary disease. Electronically Signed   By: Ronney Asters M.D.   On: 12/28/2022 18:40   DG Chest 2 View  Result Date: 12/23/2022 CLINICAL DATA:  Shortness of breath. EXAM: CHEST - 2 VIEW COMPARISON:  December 19, 2022 FINDINGS: The heart size and mediastinal contours are within normal limits. The lungs are hyperinflated. Both lungs are clear. The visualized skeletal structures are unremarkable. IMPRESSION: No active cardiopulmonary disease. Electronically Signed   By: Virgina Norfolk M.D.   On: 12/23/2022 18:35   DG Chest Port 1 View  Result Date: 12/19/2022 CLINICAL DATA:  Shortness of breath since November. Hypoxic on room  air today. EXAM: PORTABLE CHEST 1 VIEW COMPARISON:  11/03/2022 FINDINGS: Heart size and pulmonary vascularity are normal. Hyperinflation of the lungs. No airspace disease or consolidation. No pleural effusions. No pneumothorax. Mediastinal contours appear intact. IMPRESSION: Mild hyperinflation.  No evidence of active pulmonary disease. Electronically Signed   By: Lucienne Capers M.D.   On: 12/19/2022 19:49      Subjective: Patient seen and examined at bedside today.  Hemodynamically stable for discharge  Discharge Exam: Vitals:   01/10/23 2106 01/11/23 0419  BP: (!) 142/100 (!) 140/92  Pulse: 86 71  Resp: 18 18  Temp: 98.6 F (37 C) 98.4 F (36.9 C)  SpO2: 94% 96%   Vitals:   01/10/23 1336 01/10/23 1933 01/10/23 2106 01/11/23 0419  BP: 120/76  (!) 142/100 (!) 140/92  Pulse: 73  86 71  Resp: 15  18 18   Temp: 98.1 F (36.7 C)  98.6 F (37 C) 98.4 F (36.9 C)  TempSrc: Oral  Oral Oral  SpO2: 96% 95% 94% 96%  Weight:      Height:        General: Pt is alert, awake,  not in acute distress Cardiovascular: RRR, S1/S2 +, no rubs, no gallops Respiratory: CTA bilaterally, no wheezing, no rhonchi Abdominal: Soft, NT, ND, bowel sounds + Extremities: no edema, no cyanosis    The results of significant diagnostics from this hospitalization (including imaging, microbiology, ancillary and laboratory) are listed below for reference.     Microbiology: Recent Results (from the past 240 hour(s))  Resp panel by RT-PCR (RSV, Flu A&B, Covid) Anterior Nasal Swab     Status: None   Collection Time: 01/09/23  7:52 AM   Specimen: Anterior Nasal Swab  Result Value Ref Range Status   SARS Coronavirus 2 by RT PCR NEGATIVE NEGATIVE Final    Comment: (NOTE) SARS-CoV-2 target nucleic acids are NOT DETECTED.  The SARS-CoV-2 RNA is generally detectable in upper respiratory specimens during the acute phase of infection. The lowest concentration of SARS-CoV-2 viral copies this assay can detect  is 138 copies/mL. A negative result does not preclude SARS-Cov-2 infection and should not be used as the sole basis for treatment or other patient management decisions. A negative result may occur with  improper specimen collection/handling, submission of specimen other than nasopharyngeal swab, presence of viral mutation(s) within the areas targeted by this assay, and inadequate number of viral copies(<138 copies/mL). A negative result must be combined with clinical observations, patient history, and epidemiological information. The expected result is Negative.  Fact Sheet for Patients:  BloggerCourse.com  Fact Sheet for Healthcare Providers:  SeriousBroker.it  This test is no t yet approved or cleared by the [place] FDA and  has been authorized for detection and/or diagnosis of SARS-CoV-2 by FDA under an Emergency Use Authorization (EUA). This EUA will remain  in effect (meaning this test can be used) for the duration of the COVID-19 declaration under Section 564(b)(1) of the Act, 21 U.S.C.section 360bbb-3(b)(1), unless the authorization is terminated  or revoked sooner.       Influenza A by PCR NEGATIVE NEGATIVE Final   Influenza B by PCR NEGATIVE NEGATIVE Final    Comment: (NOTE) The Xpert Xpress SARS-CoV-2/FLU/RSV plus assay is intended as an aid in the diagnosis of influenza from Nasopharyngeal swab specimens and should not be used as a sole basis for treatment. Nasal washings and aspirates are unacceptable for Xpert Xpress SARS-CoV-2/FLU/RSV testing.  Fact Sheet for Patients: BloggerCourse.com  Fact Sheet for Healthcare Providers: SeriousBroker.it  This test is not yet approved or cleared by the [place] FDA and has been authorized for detection and/or diagnosis of SARS-CoV-2 by FDA under an Emergency Use Authorization (EUA). This EUA will remain in effect  (meaning this test can be used) for the duration of the COVID-19 declaration under Section 564(b)(1) of the Act, 21 U.S.C. section 360bbb-3(b)(1), unless the authorization is terminated or revoked.     Resp Syncytial Virus by PCR NEGATIVE NEGATIVE Final    Comment: (NOTE) Fact Sheet for Patients: BloggerCourse.com  Fact Sheet for Healthcare Providers: SeriousBroker.it  This test is not yet approved or cleared by the [place] FDA and has been authorized for detection and/or diagnosis of SARS-CoV-2 by FDA under an Emergency Use Authorization (EUA). This EUA will remain in effect (meaning this test can be used) for the duration of the COVID-19 declaration under Section 564(b)(1) of the Act, 21 U.S.C. section 360bbb-3(b)(1), unless the authorization is terminated or revoked.  Performed at [facility], [address], [place], [place] [address]      Labs: BNP (last 3 results) Recent Labs  11/03/22 1402 12/20/22 0404 01/09/23 0750  BNP 85.2 14.2 16.1   Basic Metabolic Panel: Recent Labs  Lab 01/09/23 0750 01/10/23 0650  NA 137 134*  K 3.7 4.2  CL 102 97*  CO2 28 29  GLUCOSE 124* 113*  BUN 10 19  CREATININE 0.84 0.98  CALCIUM 8.6* 8.7*   Liver Function Tests: Recent Labs  Lab 01/09/23 0750 01/10/23 0650  AST 19 17  ALT 19 16  ALKPHOS 34* 32*  BILITOT 0.6 0.5  PROT 6.4* 6.1*  ALBUMIN 3.6 3.2*   No results for input(s): "LIPASE", "AMYLASE" in the last 168 hours. No results for input(s): "AMMONIA" in the last 168 hours. CBC: Recent Labs  Lab 01/09/23 0750 01/10/23 0650 01/11/23 0514  WBC 11.1* 21.0* 20.1*  NEUTROABS 9.4*  --   --   HGB 13.9 13.1 13.2  HCT 41.6 39.2 39.4  MCV 85.6 85.6 86.0  PLT 305 315 317   Cardiac Enzymes: No results for input(s): "CKTOTAL", "CKMB", "CKMBINDEX", "TROPONINI" in the last 168 hours. BNP: Invalid input(s): "POCBNP" CBG: No results for  input(s): "GLUCAP" in the last 168 hours. D-Dimer No results for input(s): "DDIMER" in the last 72 hours. Hgb A1c No results for input(s): "HGBA1C" in the last 72 hours. Lipid Profile No results for input(s): "CHOL", "HDL", "LDLCALC", "TRIG", "CHOLHDL", "LDLDIRECT" in the last 72 hours. Thyroid function studies No results for input(s): "TSH", "T4TOTAL", "T3FREE", "THYROIDAB" in the last 72 hours.  Invalid input(s): "FREET3" Anemia work up No results for input(s): "VITAMINB12", "FOLATE", "FERRITIN", "TIBC", "IRON", "RETICCTPCT" in the last 72 hours. Urinalysis    Component Value Date/Time   COLORURINE YELLOW 12/20/2022 0404   APPEARANCEUR HAZY (A) 12/20/2022 0404   LABSPEC 1.015 12/20/2022 0404   PHURINE 5.0 12/20/2022 0404   GLUCOSEU >=500 (A) 12/20/2022 0404   HGBUR NEGATIVE 12/20/2022 0404   BILIRUBINUR NEGATIVE 12/20/2022 0404   KETONESUR NEGATIVE 12/20/2022 0404   PROTEINUR NEGATIVE 12/20/2022 0404   NITRITE NEGATIVE 12/20/2022 0404   LEUKOCYTESUR NEGATIVE 12/20/2022 0404   Sepsis Labs Recent Labs  Lab 01/09/23 0750 01/10/23 0650 01/11/23 0514  WBC 11.1* 21.0* 20.1*   Microbiology Recent Results (from the past 240 hour(s))  Resp panel by RT-PCR (RSV, Flu A&B, Covid) Anterior Nasal Swab     Status: None   Collection Time: 01/09/23  7:52 AM   Specimen: Anterior Nasal Swab  Result Value Ref Range Status   SARS Coronavirus 2 by RT PCR NEGATIVE NEGATIVE Final    Comment: (NOTE) SARS-CoV-2 target nucleic acids are NOT DETECTED.  The SARS-CoV-2 RNA is generally detectable in upper respiratory specimens during the acute phase of infection. The lowest concentration of SARS-CoV-2 viral copies this assay can detect is 138 copies/mL. A negative result does not preclude SARS-Cov-2 infection and should not be used as the sole basis for treatment or other patient management decisions. A negative result may occur with  improper specimen collection/handling, submission of  specimen other than nasopharyngeal swab, presence of viral mutation(s) within the areas targeted by this assay, and inadequate number of viral copies(<138 copies/mL). A negative result must be combined with clinical observations, patient history, and epidemiological information. The expected result is Negative.  Fact Sheet for Patients:  EntrepreneurPulse.com.au  Fact Sheet for Healthcare Providers:  IncredibleEmployment.be  This test is no t yet approved or cleared by the [place] FDA and  has been authorized for detection and/or diagnosis of SARS-CoV-2 by FDA under an Emergency Use Authorization (EUA). This EUA will remain  in effect (meaning this test can be used) for the duration of the COVID-19 declaration under Section 564(b)(1) of the Act, 21 U.S.C.section 360bbb-3(b)(1), unless the authorization is terminated  or revoked sooner.       Influenza A by PCR NEGATIVE NEGATIVE Final   Influenza B by PCR NEGATIVE NEGATIVE Final    Comment: (NOTE) The Xpert Xpress SARS-CoV-2/FLU/RSV plus assay is intended as an aid in the diagnosis of influenza from Nasopharyngeal swab specimens and should not be used as a sole basis for treatment. Nasal washings and aspirates are unacceptable for Xpert Xpress SARS-CoV-2/FLU/RSV testing.  Fact Sheet for Patients: EntrepreneurPulse.com.au  Fact Sheet for Healthcare Providers: IncredibleEmployment.be  This test is not yet approved or cleared by the [place] FDA and has been authorized for detection and/or diagnosis of SARS-CoV-2 by FDA under an Emergency Use Authorization (EUA). This EUA will remain in effect (meaning this test can be used) for the duration of the COVID-19 declaration under Section 564(b)(1) of the Act, 21 U.S.C. section 360bbb-3(b)(1), unless the authorization is terminated or revoked.     Resp Syncytial Virus by PCR NEGATIVE NEGATIVE Final     Comment: (NOTE) Fact Sheet for Patients: EntrepreneurPulse.com.au  Fact Sheet for Healthcare Providers: IncredibleEmployment.be  This test is not yet approved or cleared by the [place] FDA and has been authorized for detection and/or diagnosis of SARS-CoV-2 by FDA under an Emergency Use Authorization (EUA). This EUA will remain in effect (meaning this test can be used) for the duration of the COVID-19 declaration under Section 564(b)(1) of the Act, 21 U.S.C. section 360bbb-3(b)(1), unless the authorization is terminated or revoked.  Performed at [facility], [place] [address], [place], [place] [address]     Please note: You were cared for by a hospitalist during your hospital stay. Once you are discharged, your primary care physician will handle any further medical issues. Please note that NO REFILLS for any discharge medications will be authorized once you are discharged, as it is imperative that you return to your primary care physician (or establish a relationship with a primary care physician if you do not have one) for your post hospital discharge needs so that they can reassess your need for medications and monitor your lab values.    Time coordinating discharge: 40 minutes  SIGNED:   Shelly Coss, MD  [facility] 01/11/2023, 10:10 AM Pager [phone]  If 7PM-7AM, please contact night-coverage [email or web address] Password [email or web address]

## 2023-01-11 NOTE — Progress Notes (Signed)
PT Cancellation Note  Patient Details Name: Franklin Chavez MRN: [id number] DOB: [birth year]   Cancelled Treatment:    Reason Eval/Treat Not Completed: Other (comment) per MD, patient doing well no need for PT eval.   Deniece Ree PT DPT PN2

## 2023-01-27 ENCOUNTER — Other Ambulatory Visit: Payer: Self-pay

## 2023-01-27 ENCOUNTER — Emergency Department (HOSPITAL_COMMUNITY): Payer: Medicaid Other

## 2023-01-27 ENCOUNTER — Encounter (HOSPITAL_COMMUNITY): Payer: Self-pay

## 2023-01-27 ENCOUNTER — Inpatient Hospital Stay (HOSPITAL_COMMUNITY)
Admission: EM | Admit: 2023-01-27 | Discharge: 2023-02-01 | DRG: 190 | Disposition: A | Discharge status: Home / Self Care | Payer: Medicaid Other | Attending: Internal Medicine | Admitting: Internal Medicine

## 2023-01-27 DIAGNOSIS — Z1152 Encounter for screening for COVID-19: Secondary | ICD-10-CM

## 2023-01-27 DIAGNOSIS — J9621 Acute and chronic respiratory failure with hypoxia: Secondary | ICD-10-CM | POA: Diagnosis not present

## 2023-01-27 DIAGNOSIS — Z9981 Dependence on supplemental oxygen: Secondary | ICD-10-CM

## 2023-01-27 DIAGNOSIS — Z720 Tobacco use: Secondary | ICD-10-CM | POA: Diagnosis not present

## 2023-01-27 DIAGNOSIS — Z5900 Homelessness unspecified: Secondary | ICD-10-CM

## 2023-01-27 DIAGNOSIS — D72829 Elevated white blood cell count, unspecified: Secondary | ICD-10-CM | POA: Diagnosis present

## 2023-01-27 DIAGNOSIS — J439 Emphysema, unspecified: Secondary | ICD-10-CM | POA: Diagnosis present

## 2023-01-27 DIAGNOSIS — Z7951 Long term (current) use of inhaled steroids: Secondary | ICD-10-CM

## 2023-01-27 DIAGNOSIS — Z8249 Family history of ischemic heart disease and other diseases of the circulatory system: Secondary | ICD-10-CM

## 2023-01-27 DIAGNOSIS — F411 Generalized anxiety disorder: Secondary | ICD-10-CM | POA: Diagnosis present

## 2023-01-27 DIAGNOSIS — F1410 Cocaine abuse, uncomplicated: Secondary | ICD-10-CM | POA: Diagnosis not present

## 2023-01-27 DIAGNOSIS — T380X5A Adverse effect of glucocorticoids and synthetic analogues, initial encounter: Secondary | ICD-10-CM | POA: Diagnosis present

## 2023-01-27 DIAGNOSIS — E440 Moderate protein-calorie malnutrition: Secondary | ICD-10-CM | POA: Diagnosis present

## 2023-01-27 DIAGNOSIS — J441 Chronic obstructive pulmonary disease with (acute) exacerbation: Secondary | ICD-10-CM | POA: Diagnosis not present

## 2023-01-27 DIAGNOSIS — I5032 Chronic diastolic (congestive) heart failure: Secondary | ICD-10-CM | POA: Diagnosis not present

## 2023-01-27 DIAGNOSIS — Z91148 Patient's other noncompliance with medication regimen for other reason: Secondary | ICD-10-CM

## 2023-01-27 DIAGNOSIS — I159 Secondary hypertension, unspecified: Secondary | ICD-10-CM

## 2023-01-27 DIAGNOSIS — Z833 Family history of diabetes mellitus: Secondary | ICD-10-CM

## 2023-01-27 DIAGNOSIS — F1290 Cannabis use, unspecified, uncomplicated: Secondary | ICD-10-CM | POA: Diagnosis present

## 2023-01-27 DIAGNOSIS — I10 Essential (primary) hypertension: Secondary | ICD-10-CM | POA: Diagnosis present

## 2023-01-27 DIAGNOSIS — Z91199 Patient's noncompliance with other medical treatment and regimen due to unspecified reason: Secondary | ICD-10-CM

## 2023-01-27 DIAGNOSIS — I110 Hypertensive heart disease with heart failure: Secondary | ICD-10-CM | POA: Diagnosis present

## 2023-01-27 DIAGNOSIS — E8809 Other disorders of plasma-protein metabolism, not elsewhere classified: Secondary | ICD-10-CM | POA: Diagnosis present

## 2023-01-27 DIAGNOSIS — Z79899 Other long term (current) drug therapy: Secondary | ICD-10-CM

## 2023-01-27 DIAGNOSIS — F17210 Nicotine dependence, cigarettes, uncomplicated: Secondary | ICD-10-CM | POA: Diagnosis present

## 2023-01-27 LAB — CBC WITH DIFFERENTIAL/PLATELET
Abs Immature Granulocytes: 0.18 10*3/uL — ABNORMAL HIGH (ref 0.00–0.07)
Basophils Absolute: 0.1 10*3/uL (ref 0.0–0.1)
Basophils Relative: 1 %
Eosinophils Absolute: 0.6 10*3/uL — ABNORMAL HIGH (ref 0.0–0.5)
Eosinophils Relative: 4 %
HCT: 44.2 % (ref 39.0–52.0)
Hemoglobin: 14.8 g/dL (ref 13.0–17.0)
Immature Granulocytes: 1 %
Lymphocytes Relative: 6 %
Lymphs Abs: 0.7 10*3/uL (ref 0.7–4.0)
MCH: 29.1 pg (ref 26.0–34.0)
MCHC: 33.5 g/dL (ref 30.0–36.0)
MCV: 86.8 fL (ref 80.0–100.0)
Monocytes Absolute: 0.3 10*3/uL (ref 0.1–1.0)
Monocytes Relative: 2 %
Neutro Abs: 11.1 10*3/uL — ABNORMAL HIGH (ref 1.7–7.7)
Neutrophils Relative %: 86 %
Platelets: 264 10*3/uL (ref 150–400)
RBC: 5.09 MIL/uL (ref 4.22–5.81)
RDW: 15.4 % (ref 11.5–15.5)
WBC: 12.9 10*3/uL — ABNORMAL HIGH (ref 4.0–10.5)
nRBC: 0 % (ref 0.0–0.2)

## 2023-01-27 LAB — TROPONIN I (HIGH SENSITIVITY)
Troponin I (High Sensitivity): 10 ng/L (ref <18)
Troponin I (High Sensitivity): 10 ng/L (ref <18)

## 2023-01-27 LAB — BASIC METABOLIC PANEL
Anion gap: 8 (ref 5–15)
BUN: 7 mg/dL (ref 6–20)
CO2: 29 mmol/L (ref 22–32)
Calcium: 8.3 mg/dL — ABNORMAL LOW (ref 8.9–10.3)
Chloride: 101 mmol/L (ref 98–111)
Creatinine, Ser: 0.85 mg/dL (ref 0.61–1.24)
GFR, Estimated: >60 mL/min (ref >60)
Glucose, Bld: 113 mg/dL — ABNORMAL HIGH (ref 70–99)
Potassium: 3.7 mmol/L (ref 3.5–5.1)
Sodium: 138 mmol/L (ref 135–145)

## 2023-01-27 LAB — BRAIN NATRIURETIC PEPTIDE: B Natriuretic Peptide: 19.5 pg/mL (ref 0.0–100.0)

## 2023-01-27 MED ORDER — IPRATROPIUM-ALBUTEROL 0.5-2.5 (3) MG/3ML IN SOLN
3.0000 mL | Freq: Once | RESPIRATORY_TRACT | Status: AC
  Administered 2023-01-27: 3 mL via RESPIRATORY_TRACT
  Filled 2023-01-27: qty 3

## 2023-01-27 MED ORDER — AMLODIPINE BESYLATE 10 MG PO TABS
10.0000 mg | ORAL_TABLET | Freq: Every day | ORAL | Status: DC
  Administered 2023-01-28 – 2023-02-01 (×6): 10 mg via ORAL
  Filled 2023-01-27: qty 2
  Filled 2023-01-27 (×5): qty 1

## 2023-01-27 MED ORDER — ALBUTEROL SULFATE (2.5 MG/3ML) 0.083% IN NEBU: 2.5000 mg | INHALATION_SOLUTION | RESPIRATORY_TRACT | Status: DC | PRN

## 2023-01-27 MED ORDER — IPRATROPIUM-ALBUTEROL 0.5-2.5 (3) MG/3ML IN SOLN
3.0000 mL | Freq: Four times a day (QID) | RESPIRATORY_TRACT | Status: DC | PRN
  Administered 2023-01-28: 3 mL via RESPIRATORY_TRACT
  Filled 2023-01-27: qty 3

## 2023-01-27 MED ORDER — METHYLPREDNISOLONE SODIUM SUCC 40 MG IJ SOLR
40.0000 mg | INTRAMUSCULAR | Status: AC
  Administered 2023-01-28: 40 mg via INTRAVENOUS
  Filled 2023-01-27: qty 1

## 2023-01-27 MED ORDER — PREDNISONE 20 MG PO TABS: 40.0000 mg | ORAL_TABLET | Freq: Every day | ORAL | Status: DC

## 2023-01-27 MED ORDER — ENOXAPARIN SODIUM 40 MG/0.4ML IJ SOSY
40.0000 mg | PREFILLED_SYRINGE | INTRAMUSCULAR | Status: DC
  Administered 2023-01-28 – 2023-01-30 (×3): 40 mg via SUBCUTANEOUS
  Filled 2023-01-27 (×5): qty 0.4

## 2023-01-27 MED ORDER — METHYLPREDNISOLONE SODIUM SUCC 40 MG IJ SOLR: 40.0000 mg | Freq: Two times a day (BID) | INTRAMUSCULAR | Status: DC

## 2023-01-27 NOTE — ED Notes (Signed)
Patient refused resp swab

## 2023-01-27 NOTE — ED Provider Notes (Signed)
[place] [facility] Provider Note   CSN: [id number] Arrival date & time: 01/27/23  2006     History  Chief Complaint  Patient presents with   Shortness of Breath    Franklin Chavez is a 62 y.o. male.  62 year old male with prior medical history as detailed below presents for evaluation.  Patient reports chronic issues with COPD.  Patient reports increased shortness of breath over the last 2 to 3 days.  He denies fever.  He reports increased wheezing.  EMS administered 125 mg of Solu-Medrol and breathing treatments during transport. EMS increased WOB, moderate respiratory distress on initial evaluation.  Patient reports moderate improvement in symptoms after EMS transport.  The history is provided by the patient and medical records.       Home Medications Prior to Admission medications   Medication Sig Start Date End Date Taking? Authorizing Provider  albuterol (VENTOLIN HFA) 108 (90 Base) MCG/ACT inhaler Inhale 1-2 puffs into the lungs every 6 (six) hours as needed for wheezing or shortness of breath. Patient taking differently: Inhale 1 puff into the lungs every 6 (six) hours as needed for wheezing or shortness of breath. 11/07/22   Rai, Ripudeep K, MD  amLODipine (NORVASC) 10 MG tablet Take 10 mg by mouth daily.    [provider]  busPIRone (BUSPAR) 5 MG tablet Take 1 tablet (5 mg total) by mouth 2 (two) times daily. 11/07/22   Rai, Ripudeep K, MD  fluticasone-salmeterol (ADVAIR DISKUS) 250-50 MCG/ACT AEPB Inhale 1 puff into the lungs in the morning and at bedtime. 11/07/22   Rai, Vernelle Emerald, MD  guaiFENesin (MUCINEX) 600 MG 12 hr tablet Take 1 tablet (600 mg total) by mouth 2 (two) times daily. Patient not taking: Reported on 12/28/2022 12/22/22   Florencia Reasons, MD  isosorbide mononitrate (IMDUR) 60 MG 24 hr tablet Take 1 tablet (60 mg total) by mouth daily. 12/22/22   Florencia Reasons, MD  losartan (COZAAR) 100 MG tablet Take 1 tablet (100 mg  total) by mouth daily. 12/22/22   Florencia Reasons, MD  tiotropium (SPIRIVA HANDIHALER) 18 MCG inhalation capsule Place 1 capsule (18 mcg total) into inhaler and inhale daily. Patient not taking: Reported on 12/20/2022 11/07/22 02/05/23  Mendel Corning, MD      Allergies    Patient has no known allergies.    Review of Systems   Review of Systems  All other systems reviewed and are negative.   Physical Exam Updated Vital Signs BP (!) 151/92 (BP Location: Right Arm)   Pulse 72   Temp 97.8 F (36.6 C) (Oral)   Resp 19   Ht 6' (1.829 m)   Wt 68 kg   SpO2 96% Comment: 3l  BMI 20.33 kg/m  Physical Exam Vitals and nursing note reviewed.  Constitutional:      General: He is not in acute distress.    Appearance: Normal appearance. He is well-developed.  HENT:     Head: Normocephalic and atraumatic.  Eyes:     Conjunctiva/sclera: Conjunctivae normal.     Pupils: Pupils are equal, round, and reactive to light.  Cardiovascular:     Rate and Rhythm: Normal rate and regular rhythm.     Heart sounds: Normal heart sounds.  Pulmonary:     Effort: Pulmonary effort is normal. No respiratory distress.     Comments: Diffuse scattered expiratory wheezes in all lung fields Abdominal:     General: There is no distension.  Palpations: Abdomen is soft.     Tenderness: There is no abdominal tenderness.  Musculoskeletal:        General: No deformity. Normal range of motion.     Cervical back: Normal range of motion and neck supple.  Skin:    General: Skin is warm and dry.  Neurological:     General: No focal deficit present.     Mental Status: He is alert and oriented to person, place, and time.     ED Results / Procedures / Treatments   Labs (all labs ordered are listed, but only abnormal results are displayed) Labs Reviewed  BASIC METABOLIC PANEL - Abnormal; Notable for the following components:      Result Value   Glucose, Bld 113 (*)    Calcium 8.3 (*)    All other components within  normal limits  CBC WITH DIFFERENTIAL/PLATELET - Abnormal; Notable for the following components:   WBC 12.9 (*)    Neutro Abs 11.1 (*)    Eosinophils Absolute 0.6 (*)    Abs Immature Granulocytes 0.18 (*)    All other components within normal limits  RESP PANEL BY RT-PCR (RSV, FLU A&B, COVID)  [id number]  BRAIN NATRIURETIC PEPTIDE  CBC WITH DIFFERENTIAL/PLATELET  RAPID URINE DRUG SCREEN, HOSP PERFORMED  TROPONIN I (HIGH SENSITIVITY)  TROPONIN I (HIGH SENSITIVITY)    EKG None  Radiology No results found.  Procedures Procedures    Medications Ordered in ED Medications  ipratropium-albuterol (DUONEB) 0.5-2.5 (3) MG/3ML nebulizer solution 3 mL (3 mLs Nebulization Given 01/27/23 2044)    ED Course/ Medical Decision Making/ A&P                             Medical Decision Making Amount and/or Complexity of Data Reviewed Labs: ordered. Radiology: ordered.  Risk Prescription drug management.    Medical Screen Complete  This patient presented to the ED with complaint of dyspnea, wheezing.  This complaint involves an extensive number of treatment options. The initial differential diagnosis includes, but is not limited to, COPD exacerbation, metabolic abnormality, etc.  This presentation is: Acute, Chronic, Self-Limited, Previously Undiagnosed, Uncertain Prognosis, Complicated, Systemic Symptoms, and Threat to Life/Bodily Function  Patient with known history of COPD presents with increased wheezing and shortness of breath.  Patient reports improvement with EMS treatment during transport.  However, patient with persistent expiratory wheezes in all lung fields.  Workup is not suggestive of acute infectious process.  Patient demonstrates improvement while in ED.  However patient would benefit from admission for continued breathing treatments and observation.  Hospitalist service made aware of case will evaluate for admission.      Additional history  obtained:  Additional history obtained from EMS External records from outside sources obtained and reviewed including prior ED visits and prior Inpatient records.    Lab Tests:  I ordered and personally interpreted labs.  The pertinent results include: CBC, BMP, troponin, BNP   Imaging Studies ordered:  I ordered imaging studies including chest x-ray I independently visualized and interpreted obtained imaging which showed NAD I agree with the radiologist interpretation.   Cardiac Monitoring:  The patient was maintained on a cardiac monitor.  I personally viewed and interpreted the cardiac monitor which showed an underlying rhythm of: NSR   Medicines ordered:  I ordered medication including Duoneb  for wheezing  Reevaluation of the patient after these medicines showed that the patient: improved   Problem List /  ED Course:  COPD exacerbation   Reevaluation:  After the interventions noted above, I reevaluated the patient and found that they have: improved  Disposition:  After consideration of the diagnostic results and the patients response to treatment, I feel that the patent would benefit from admission.          Final Clinical Impression(s) / ED Diagnoses Final diagnoses:  COPD exacerbation [facility])    Rx / DC Orders ED Discharge Orders     None         Valarie Merino, MD 01/27/23 2235

## 2023-01-27 NOTE — Assessment & Plan Note (Signed)
-  likely due to recent steroid use

## 2023-01-27 NOTE — ED Notes (Signed)
Pt declined RSV panel.

## 2023-01-27 NOTE — ED Notes (Signed)
Pt declined RSV panel. MD made aware.

## 2023-01-27 NOTE — Assessment & Plan Note (Addendum)
-  likely secondary to continual tobacco and cocaine use -duoneb q6hr PRN, albuterol q4 PRN -solu-medrol IV tomorrow with transition to prednisone the following day -has only been using albuterol and denies having other maintenance inhalers at home

## 2023-01-27 NOTE — H&P (Signed)
History and Physical    Patient: Franklin Chavez [id number] DOB: 1961/01/27 DOA: 01/27/2023 DOS: the patient was seen and examined on 01/28/2023 PCP: [facility]  Patient coming from: Home  Chief Complaint:  Chief Complaint  Patient presents with   Shortness of Breath   HPI: Franklin Chavez is a 62 y.o. male with medical history significant of HTN, diastolic HF, COPD, GAD, polysubstance abuse (cocaine, THC, tobacco) who presents with increasing shortness of breath.   He was recently admitted from 2/6-2/8 with acute hypoxic respiratory failure seconary to COPD exacerbation requiring 3L. He was prescribed prednisone for 3 more days on discharge. Reports return of symptoms once steroid course was completed. Has increase dry cough and increase shortness of breath. Has been using albuterol inhaler at home. Continues to use tobacco but has been cutting down to 2 cigarettes daily. Last used cocaine and THC yesterday.  Does not take any other medications at home including those for his hypertension and heart failure.   In ED, temp of 97.35F, BP of 151/92.   Has leukocytosis of 12.9, hgb of 14.8.   BMP unremarkable. BNP 19. Troponin of 10.   EKG on my review with LVH and elevated J-point to V3.   He was administered '125mg'$  of Solu-medrol and breathing treatments with EMS. Received additional duo-neb in ED. Hospitalist consulted for continual management of COPD exacerbation.    Review of Systems: As mentioned in the history of present illness. All other systems reviewed and are negative. Past Medical History:  Diagnosis Date   COPD (chronic obstructive pulmonary disease) ([place])    Essential hypertension    GAD (generalized anxiety disorder)    Poor dentition 12/13/2018   Tobacco use disorder 12/13/2018   History reviewed. No pertinent surgical history. Social History:  reports that he has been smoking. He has never used smokeless tobacco. He reports that he does  not drink alcohol and does not use drugs.  No Known Allergies  Family History  Problem Relation Age of Onset   Diabetes Mellitus II Mother    Hypertension Mother     Prior to Admission medications   Medication Sig Start Date End Date Taking? Authorizing Provider  albuterol (VENTOLIN HFA) 108 (90 Base) MCG/ACT inhaler Inhale 1-2 puffs into the lungs every 6 (six) hours as needed for wheezing or shortness of breath. Patient taking differently: Inhale 1 puff into the lungs every 6 (six) hours as needed for wheezing or shortness of breath. 11/07/22  Yes Rai, Ripudeep K, MD  amLODipine (NORVASC) 10 MG tablet Take 10 mg by mouth daily.   Yes [provider]  busPIRone (BUSPAR) 5 MG tablet Take 1 tablet (5 mg total) by mouth 2 (two) times daily. 11/07/22  Yes Rai, Ripudeep K, MD  fluticasone-salmeterol (ADVAIR DISKUS) 250-50 MCG/ACT AEPB Inhale 1 puff into the lungs in the morning and at bedtime. 11/07/22  Yes Rai, Ripudeep K, MD  isosorbide mononitrate (IMDUR) 60 MG 24 hr tablet Take 1 tablet (60 mg total) by mouth daily. 12/22/22  Yes Florencia Reasons, MD  losartan (COZAAR) 100 MG tablet Take 1 tablet (100 mg total) by mouth daily. 12/22/22  Yes Florencia Reasons, MD  guaiFENesin (MUCINEX) 600 MG 12 hr tablet Take 1 tablet (600 mg total) by mouth 2 (two) times daily. Patient not taking: Reported on 12/28/2022 12/22/22   Florencia Reasons, MD  tiotropium (SPIRIVA HANDIHALER) 18 MCG inhalation capsule Place 1 capsule (18 mcg total) into inhaler and inhale daily. Patient  not taking: Reported on 12/20/2022 11/07/22 02/05/23  Mendel Corning, MD    Physical Exam: Vitals:   01/27/23 2016 01/27/23 2017 01/27/23 2023  BP:  (!) 151/92   Pulse:  72   Resp:  19   Temp:  97.8 F (36.6 C)   TempSrc:  Oral   SpO2:  95% 96%  Weight: 68 kg    Height: 6' (1.829 m) 6' (1.829 m)    Constitutional: NAD, calm, comfortable, elderly male sitting upright asleep in bed Eyes: lids and conjunctivae normal ENMT: Mucous membranes are  moist.  Neck: normal, supple Respiratory: diminished breath sounds throughout but no wheezing, no crackles. Normal respiratory effort. No accessory muscle use.  Cardiovascular: Regular rate and rhythm, no murmurs / rubs / gallops. No extremity edema.  Abdomen: soft, non-distended, no tenderness, Bowel sounds positive.  Musculoskeletal: no clubbing / cyanosis. No joint deformity upper and lower extremities. Normal muscle tone.  Skin: no rashes, lesions, ulcers.  Neurologic: CN 2-12 grossly intact.  Psychiatric: .Normal mood. Data Reviewed:  See HPI  Assessment and Plan: * COPD exacerbation ([place]) -likely secondary to continual tobacco and cocaine use -duoneb q6hr PRN, albuterol q4 PRN -solu-medrol IV tomorrow with transition to prednisone the following day -has only been using albuterol and denies having other maintenance inhalers at home  Tobacco abuse -continues to smoke about 2 cigarettes daily  -encouraged cessation  GAD (generalized anxiety disorder) -not currently taking his medication. Previously prescribed Buspar.  Cocaine abuse ([place]) -reports last use yesterday  Chronic diastolic CHF (congestive heart failure) (HCC) -euvolemic   Leukocytosis -likely due to recent steroid use  HTN (hypertension) -reports non-compliance with his medications -BP is elevated to 150/90s -will resume amlodipine for now and monitor      Advance Care Planning: Full  Consults: none  Family Communication: none at bedside  Severity of Illness: The appropriate patient status for this patient is OBSERVATION. Observation status is judged to be reasonable and necessary in order to provide the required intensity of service to ensure the patient's safety. The patient's presenting symptoms, physical exam findings, and initial radiographic and laboratory data in the context of their medical condition is felt to place them at decreased risk for further clinical deterioration. Furthermore, it is  anticipated that the patient will be medically stable for discharge from the hospital within 2 midnights of admission.   Author: Orene Desanctis, DO 01/28/2023 12:03 AM  For on call review [email or web address].

## 2023-01-27 NOTE — Assessment & Plan Note (Signed)
-  reports non-compliance with his medications -BP is elevated to 150/90s -will resume amlodipine for now and monitor

## 2023-01-27 NOTE — ED Triage Notes (Signed)
Ran out of his prednisone this morning, and has been SOB since. He has wheezing noted. Denis any Drug use.   125-solumedrol   .5-Atrovent 5- albuterol 18 LFA

## 2023-01-28 DIAGNOSIS — Z91148 Patient's other noncompliance with medication regimen for other reason: Secondary | ICD-10-CM | POA: Diagnosis not present

## 2023-01-28 DIAGNOSIS — J439 Emphysema, unspecified: Secondary | ICD-10-CM | POA: Diagnosis present

## 2023-01-28 DIAGNOSIS — J9601 Acute respiratory failure with hypoxia: Secondary | ICD-10-CM

## 2023-01-28 DIAGNOSIS — Z720 Tobacco use: Secondary | ICD-10-CM | POA: Diagnosis not present

## 2023-01-28 DIAGNOSIS — F411 Generalized anxiety disorder: Secondary | ICD-10-CM | POA: Diagnosis not present

## 2023-01-28 DIAGNOSIS — Z79899 Other long term (current) drug therapy: Secondary | ICD-10-CM | POA: Diagnosis not present

## 2023-01-28 DIAGNOSIS — Z91199 Patient's noncompliance with other medical treatment and regimen due to unspecified reason: Secondary | ICD-10-CM | POA: Diagnosis not present

## 2023-01-28 DIAGNOSIS — Z833 Family history of diabetes mellitus: Secondary | ICD-10-CM | POA: Diagnosis not present

## 2023-01-28 DIAGNOSIS — Z8249 Family history of ischemic heart disease and other diseases of the circulatory system: Secondary | ICD-10-CM | POA: Diagnosis not present

## 2023-01-28 DIAGNOSIS — E440 Moderate protein-calorie malnutrition: Secondary | ICD-10-CM | POA: Diagnosis present

## 2023-01-28 DIAGNOSIS — F1410 Cocaine abuse, uncomplicated: Secondary | ICD-10-CM | POA: Diagnosis present

## 2023-01-28 DIAGNOSIS — J441 Chronic obstructive pulmonary disease with (acute) exacerbation: Secondary | ICD-10-CM | POA: Diagnosis not present

## 2023-01-28 DIAGNOSIS — Z5900 Homelessness unspecified: Secondary | ICD-10-CM | POA: Diagnosis not present

## 2023-01-28 DIAGNOSIS — Z7951 Long term (current) use of inhaled steroids: Secondary | ICD-10-CM | POA: Diagnosis not present

## 2023-01-28 DIAGNOSIS — J9621 Acute and chronic respiratory failure with hypoxia: Secondary | ICD-10-CM | POA: Diagnosis not present

## 2023-01-28 DIAGNOSIS — Z9981 Dependence on supplemental oxygen: Secondary | ICD-10-CM | POA: Diagnosis not present

## 2023-01-28 DIAGNOSIS — Z1152 Encounter for screening for COVID-19: Secondary | ICD-10-CM | POA: Diagnosis not present

## 2023-01-28 DIAGNOSIS — I110 Hypertensive heart disease with heart failure: Secondary | ICD-10-CM | POA: Diagnosis present

## 2023-01-28 DIAGNOSIS — D72829 Elevated white blood cell count, unspecified: Secondary | ICD-10-CM | POA: Diagnosis present

## 2023-01-28 DIAGNOSIS — F17210 Nicotine dependence, cigarettes, uncomplicated: Secondary | ICD-10-CM | POA: Diagnosis present

## 2023-01-28 DIAGNOSIS — I5032 Chronic diastolic (congestive) heart failure: Secondary | ICD-10-CM | POA: Diagnosis not present

## 2023-01-28 DIAGNOSIS — E8809 Other disorders of plasma-protein metabolism, not elsewhere classified: Secondary | ICD-10-CM | POA: Diagnosis present

## 2023-01-28 DIAGNOSIS — F1290 Cannabis use, unspecified, uncomplicated: Secondary | ICD-10-CM | POA: Diagnosis present

## 2023-01-28 DIAGNOSIS — T380X5A Adverse effect of glucocorticoids and synthetic analogues, initial encounter: Secondary | ICD-10-CM | POA: Diagnosis present

## 2023-01-28 LAB — RESP PANEL BY RT-PCR (RSV, FLU A&B, COVID)  RVPGX2
Influenza A by PCR: NEGATIVE
Influenza B by PCR: NEGATIVE
Resp Syncytial Virus by PCR: NEGATIVE
SARS Coronavirus 2 by RT PCR: NEGATIVE

## 2023-01-28 LAB — COMPREHENSIVE METABOLIC PANEL
ALT: 13 U/L (ref 0–44)
AST: 23 U/L (ref 15–41)
Albumin: 2.7 g/dL — ABNORMAL LOW (ref 3.5–5.0)
Alkaline Phosphatase: 35 U/L — ABNORMAL LOW (ref 38–126)
Anion gap: 9 (ref 5–15)
BUN: <5 mg/dL — ABNORMAL LOW (ref 6–20)
CO2: 30 mmol/L (ref 22–32)
Calcium: 8.8 mg/dL — ABNORMAL LOW (ref 8.9–10.3)
Chloride: 99 mmol/L (ref 98–111)
Creatinine, Ser: 0.9 mg/dL (ref 0.61–1.24)
GFR, Estimated: >60 mL/min (ref >60)
Glucose, Bld: 134 mg/dL — ABNORMAL HIGH (ref 70–99)
Potassium: 4.5 mmol/L (ref 3.5–5.1)
Sodium: 138 mmol/L (ref 135–145)
Total Bilirubin: 0.7 mg/dL (ref 0.3–1.2)
Total Protein: 5.4 g/dL — ABNORMAL LOW (ref 6.5–8.1)

## 2023-01-28 LAB — PHOSPHORUS: Phosphorus: 4.6 mg/dL (ref 2.5–4.6)

## 2023-01-28 LAB — MAGNESIUM: Magnesium: 2.2 mg/dL (ref 1.7–2.4)

## 2023-01-28 LAB — RESPIRATORY PANEL BY PCR

## 2023-01-28 LAB — CBC
HCT: 45 % (ref 39.0–52.0)
Hemoglobin: 14.9 g/dL (ref 13.0–17.0)
MCH: 28.6 pg (ref 26.0–34.0)
MCHC: 33.1 g/dL (ref 30.0–36.0)
MCV: 86.4 fL (ref 80.0–100.0)
Platelets: 280 10*3/uL (ref 150–400)
RBC: 5.21 MIL/uL (ref 4.22–5.81)
RDW: 15.3 % (ref 11.5–15.5)
WBC: 10.1 10*3/uL (ref 4.0–10.5)
nRBC: 0 % (ref 0.0–0.2)

## 2023-01-28 MED ORDER — ORAL CARE MOUTH RINSE: 15.0000 mL | OROMUCOSAL | Status: DC | PRN

## 2023-01-28 MED ORDER — IPRATROPIUM BROMIDE 0.02 % IN SOLN
0.5000 mg | Freq: Two times a day (BID) | RESPIRATORY_TRACT | Status: DC
  Administered 2023-01-28 – 2023-02-01 (×8): 0.5 mg via RESPIRATORY_TRACT
  Filled 2023-01-28 (×8): qty 2.5

## 2023-01-28 MED ORDER — DOXYCYCLINE HYCLATE 100 MG PO TABS
100.0000 mg | ORAL_TABLET | Freq: Two times a day (BID) | ORAL | Status: DC
  Administered 2023-01-28 – 2023-02-01 (×9): 100 mg via ORAL
  Filled 2023-01-28 (×9): qty 1

## 2023-01-28 MED ORDER — GUAIFENESIN-DM 100-10 MG/5ML PO SYRP
10.0000 mL | ORAL_SOLUTION | ORAL | Status: DC | PRN
  Administered 2023-01-28 (×3): 10 mL via ORAL
  Filled 2023-01-28 (×3): qty 10

## 2023-01-28 MED ORDER — IPRATROPIUM BROMIDE 0.02 % IN SOLN
0.5000 mg | Freq: Four times a day (QID) | RESPIRATORY_TRACT | Status: DC
  Administered 2023-01-28: 0.5 mg via RESPIRATORY_TRACT
  Filled 2023-01-28: qty 2.5

## 2023-01-28 MED ORDER — LEVALBUTEROL HCL 0.63 MG/3ML IN NEBU
0.6300 mg | INHALATION_SOLUTION | Freq: Four times a day (QID) | RESPIRATORY_TRACT | Status: DC
  Administered 2023-01-28: 0.63 mg via RESPIRATORY_TRACT
  Filled 2023-01-28: qty 3

## 2023-01-28 MED ORDER — GUAIFENESIN ER 600 MG PO TB12
1200.0000 mg | ORAL_TABLET | Freq: Two times a day (BID) | ORAL | Status: DC
  Administered 2023-01-28 – 2023-02-01 (×9): 1200 mg via ORAL
  Filled 2023-01-28 (×9): qty 2

## 2023-01-28 MED ORDER — LEVALBUTEROL HCL 0.63 MG/3ML IN NEBU
0.6300 mg | INHALATION_SOLUTION | Freq: Two times a day (BID) | RESPIRATORY_TRACT | Status: DC
  Administered 2023-01-28 – 2023-02-01 (×8): 0.63 mg via RESPIRATORY_TRACT
  Filled 2023-01-28 (×9): qty 3

## 2023-01-28 MED ORDER — METHYLPREDNISOLONE SODIUM SUCC 40 MG IJ SOLR
40.0000 mg | Freq: Two times a day (BID) | INTRAMUSCULAR | Status: DC
  Administered 2023-01-28 – 2023-01-31 (×7): 40 mg via INTRAVENOUS
  Filled 2023-01-28 (×7): qty 1

## 2023-01-28 NOTE — Assessment & Plan Note (Signed)
-  continues to smoke about 2 cigarettes daily  -encouraged cessation

## 2023-01-28 NOTE — Assessment & Plan Note (Signed)
euvolemic 

## 2023-01-28 NOTE — Assessment & Plan Note (Signed)
-  reports last use yesterday

## 2023-01-28 NOTE — Progress Notes (Signed)
PROGRESS NOTE    Franklin Chavez  [id number] DOB: 03-05-1961 DOA: 01/27/2023 PCP: [facility]   Brief Narrative:  The patient is a 62 year old African-American male with past medical history significant for but not limited to hypertension, diastolic CHF, COPD, GAD, polysubstance abuse including cocaine, THC and tobacco as well as other comorbidities who presents with increased shortness of breath.  He was recently admitted from 2/6-2/8 with acute hypoxic respiratory failure secondary to COPD requiring 3 L.  He is prescribed prednisone for 3 more days after discharge however symptoms started returning after steroid course was completed.  He has had increased dry cough, increased shortness of breath and has been using his albuterol [place] at home and continues to have tobacco abuse and has been cutting down to 2 cigarettes daily.  He last used cocaine and THC the day before yesterday.  Does not take any other home medications for his hypertension or heart failure.  In the ED he was noted to be afebrile leukocytosis 12.8 BMP was unremarkable.  EKG showed LVH and elevated J-point to V3.  He was administered 125 mg of Solu-Medrol and breathing treatments with EMS and then received additional DuoNeb's in the ED.  The hospitalist was consulted for continual management of COPD exacerbation as well as acute respiratory failure with hypoxia.  Assessment and Plan:  Acute respiratory failure with Hypoxia in setting of COPD exacerbation -Does not wear Supplemental O2 at home -SpO2: 98 % O2 Flow Rate (L/min): 3 L/min -Continuous Pulse Oximetry and Maintain O2 Saturations >90% -Continue Supplemental O2 via [place] and [place] O2 as Tolerated -Will need an Ambulatory Home O2 Screen prior to D/C and repeat chest x-ray in a.m. -Treatment as below  *Acute COPD exacerbation (HCC) -Likely secondary to continual tobacco and cocaine use -duoneb q6hr PRN, albuterol q4 PRN have now added Xopenex and  Atrovent scheduled every 6 as well as guaifenesin 1200 mg p.o. twice daily, doxycycline 100 mg twice daily, incentive spirometry as well as flutter valve -Given his continued wheezing will hold his prednisone and resume IV Solu-Medrol 40 twice daily -solu-medrol IV tomorrow with transition to prednisone the following day -COVID and influenza AMB were not checked on admission so we will obtain these and also obtaining respiratory virus panel -WBC Trend: Recent Labs  Lab 12/30/22 0624 01/09/23 0750 01/10/23 0650 01/11/23 0514 01/27/23 2220 01/28/23 0702  WBC 25.3* 11.1* 21.0* 20.1* 12.9* 10.1  -Continue monitor and if not improving will consider obtaining a CTA of the chest -Patient does have a pulmonary appointment on Wednesday  Tobacco Abuse -Continues to smoke about 2 cigarettes daily  -If necessary will place him on a nicotine patch -Smoking cessation counseling given  GAD (generalized anxiety disorder) -Not currently taking his medication. Previously prescribed Buspar. -Continue to monitor  Cocaine abuse ([place]) -Reports last use yesterday -Counseling given  Chronic Diastolic CHF (congestive heart failure) (HCC) -Currently appears Euvolemic  -BNP was 19.5 -If he continues to be dyspneic on exertion may consider repeating an echocardiogram given his recent cocaine abuse  Leukocytosis -Likely due to recent steroid use -WBC Trend: Recent Labs  Lab 12/30/22 0624 01/09/23 0750 01/10/23 0650 01/11/23 0514 01/27/23 2220 01/28/23 0702  WBC 25.3* 11.1* 21.0* 20.1* 12.9* 10.1  -Continue to Monitor for S/Sx of Infection -Repeat CBC in the AM   HTN (Hypertension) -Reports non-compliance with his medications -BP is elevated to 150/90s on Admit -Resume Amlodipine 10 mg po Daily for now and Continue to monitor BP per  Protocol -Last BP reading was 138/78  Hypoalbuminemia -Patient's Albumin Trend: Recent Labs  Lab 01/09/23 0750 01/10/23 0650 01/28/23 0702  ALBUMIN 3.6  3.2* 2.7*  -Continue to Monitor and Trend and repeat CMP in the AM  DVT prophylaxis: enoxaparin (LOVENOX) injection 40 mg Start: 01/28/23 1000    Code Status: Full Code Family Communication: No family present at bedside   Disposition Plan:  Level of care: Med-Surg Status is: Observation The patient will require care spanning > 2 midnights and should be moved to inpatient because: Needs further clinical improvement and weaning of O2   Consultants:  None  Procedures:  As delineated as above  Antimicrobials:  Anti-infectives (From admission, onward)    Start     Dose/Rate Route Frequency Ordered Stop   01/28/23 1230  doxycycline (VIBRA-TABS) tablet 100 mg        100 mg Oral Every 12 hours 01/28/23 1136         Subjective: Seen and examined at bedside and he is still feeling dyspneic.  States that he is coughing up some yellowish-green sputum.  States that anytime he ambulates or does anything he was extremely short of breath.  No nausea or vomiting.  No other concerns or complaints at this time.  Objective: Vitals:   01/28/23 0511 01/28/23 0930 01/28/23 0940 01/28/23 1333  BP: (!) 106/94   138/78  Pulse: 64   64  Resp: 19   (!) 21  Temp: 98 F (36.7 C)   98.6 F (37 C)  TempSrc: Oral   Oral  SpO2: 96% 96% 93% 96%  Weight: 68 kg     Height: 6' (1.829 m)       Intake/Output Summary (Last 24 hours) at 01/28/2023 1336 Last data filed at 01/28/2023 0935 Gross per 24 hour  Intake 477 ml  Output --  Net 477 ml   Filed Weights   01/27/23 2016 01/28/23 0511  Weight: 68 kg 68 kg   Examination: Physical Exam:  Constitutional: Thin AAM in mild respiratory Distress Respiratory: Diminished to auscultation bilaterally coarse breath sounds and has some wheezing and rhonchi but no appreciable rales or crackles. Normal respiratory effort and patient is not tachypenic. No accessory muscle use. Wearing supplemental O2 via [place] Cardiovascular: RRR, no murmurs / rubs / gallops. S1  and S2 auscultated. No extremity edema Abdomen: Soft, non-tender, non-distended. Bowel sounds positive.  GU: Deferred. Musculoskeletal: No clubbing / cyanosis of digits/nails. No joint deformity upper and lower extremities.  Skin: No rashes, lesions, ulcers on a limited skin evaluation. No induration; Warm and dry.  Neurologic: CN 2-12 grossly intact with no focal deficits. Romberg sign and cerebellar reflexes not assessed.  Psychiatric: Normal judgment and insight. Alert and oriented x 3. Normal mood and appropriate affect.   Data Reviewed: I have personally reviewed following labs and imaging studies  CBC: Recent Labs  Lab 01/27/23 2220 01/28/23 0702  WBC 12.9* 10.1  NEUTROABS 11.1*  --   HGB 14.8 14.9  HCT 44.2 45.0  MCV 86.8 86.4  PLT 264 [id number]   Basic Metabolic Panel: Recent Labs  Lab 01/27/23 2056 01/28/23 0702  NA 138 138  K 3.7 4.5  CL 101 99  CO2 29 30  GLUCOSE 113* 134*  BUN 7 <5*  CREATININE 0.85 0.90  CALCIUM 8.3* 8.8*  MG  --  2.2  PHOS  --  4.6   GFR: Estimated Creatinine Clearance: 84 mL/min (by C-G formula based on SCr of 0.9 mg/dL). Liver Function  Tests: Recent Labs  Lab 01/28/23 0702  AST 23  ALT 13  ALKPHOS 35*  BILITOT 0.7  PROT 5.4*  ALBUMIN 2.7*   No results for input(s): "LIPASE", "AMYLASE" in the last 168 hours. No results for input(s): "AMMONIA" in the last 168 hours. Coagulation Profile: No results for input(s): "INR", "PROTIME" in the last 168 hours. Cardiac Enzymes: No results for input(s): "CKTOTAL", "CKMB", "CKMBINDEX", "TROPONINI" in the last 168 hours. BNP (last 3 results) No results for input(s): "PROBNP" in the last 8760 hours. HbA1C: No results for input(s): "HGBA1C" in the last 72 hours. CBG: No results for input(s): "GLUCAP" in the last 168 hours. Lipid Profile: No results for input(s): "CHOL", "HDL", "LDLCALC", "TRIG", "CHOLHDL", "LDLDIRECT" in the last 72 hours. Thyroid Function Tests: No results for input(s):  "TSH", "T4TOTAL", "FREET4", "T3FREE", "THYROIDAB" in the last 72 hours. Anemia Panel: No results for input(s): "VITAMINB12", "FOLATE", "FERRITIN", "TIBC", "IRON", "RETICCTPCT" in the last 72 hours. Sepsis Labs: No results for input(s): "PROCALCITON", "LATICACIDVEN" in the last 168 hours.  No results found for this or any previous visit (from the past 240 hour(s)).   Radiology Studies: DG Chest Port 1 View  Result Date: 01/27/2023 CLINICAL DATA:  Shortness of breath, wheezing and COPD. EXAM: PORTABLE CHEST 1 VIEW COMPARISON:  Portable chest 01/09/2023. FINDINGS: The heart size and mediastinal contours are within normal limits. Both lungs are emphysematous but clear. The visualized skeletal structures are unremarkable apart from slight thoracic dextroscoliosis. IMPRESSION: No evidence of acute chest disease.  Stable COPD chest. Electronically Signed   By: Telford Nab M.D.   On: 01/27/2023 21:13    Scheduled Meds:  amLODipine  10 mg Oral Daily   doxycycline  100 mg Oral Q12H   enoxaparin (LOVENOX) injection  40 mg Subcutaneous Q24H   guaiFENesin  1,200 mg Oral BID   ipratropium  0.5 mg Nebulization Q6H   levalbuterol  0.63 mg Nebulization Q6H   methylPREDNISolone (SOLU-MEDROL) injection  40 mg Intravenous Q12H   Continuous Infusions:   LOS: 0 days   Raiford Noble, DO [facility] Available via Epic secure chat 7am-7pm After these hours, please refer to coverage provider listed on [email or web address] 01/28/2023, 1:36 PM

## 2023-01-28 NOTE — Assessment & Plan Note (Signed)
-  not currently taking his medication. Previously prescribed Buspar.

## 2023-01-29 ENCOUNTER — Inpatient Hospital Stay (HOSPITAL_COMMUNITY): Payer: Medicaid Other

## 2023-01-29 DIAGNOSIS — Z720 Tobacco use: Secondary | ICD-10-CM | POA: Diagnosis not present

## 2023-01-29 DIAGNOSIS — F1410 Cocaine abuse, uncomplicated: Secondary | ICD-10-CM | POA: Diagnosis not present

## 2023-01-29 DIAGNOSIS — I5032 Chronic diastolic (congestive) heart failure: Secondary | ICD-10-CM | POA: Diagnosis not present

## 2023-01-29 DIAGNOSIS — J441 Chronic obstructive pulmonary disease with (acute) exacerbation: Secondary | ICD-10-CM | POA: Diagnosis not present

## 2023-01-29 LAB — CBC WITH DIFFERENTIAL/PLATELET
Abs Immature Granulocytes: 0.19 10*3/uL — ABNORMAL HIGH (ref 0.00–0.07)
Basophils Absolute: 0 10*3/uL (ref 0.0–0.1)
Basophils Relative: 0 %
Eosinophils Absolute: 0 10*3/uL (ref 0.0–0.5)
Eosinophils Relative: 0 %
HCT: 42.3 % (ref 39.0–52.0)
Hemoglobin: 14.5 g/dL (ref 13.0–17.0)
Immature Granulocytes: 1 %
Lymphocytes Relative: 3 %
Lymphs Abs: 0.7 10*3/uL (ref 0.7–4.0)
MCH: 29.1 pg (ref 26.0–34.0)
MCHC: 34.3 g/dL (ref 30.0–36.0)
MCV: 84.9 fL (ref 80.0–100.0)
Monocytes Absolute: 0.6 10*3/uL (ref 0.1–1.0)
Monocytes Relative: 3 %
Neutro Abs: 20.6 10*3/uL — ABNORMAL HIGH (ref 1.7–7.7)
Neutrophils Relative %: 93 %
Platelets: 296 10*3/uL (ref 150–400)
RBC: 4.98 MIL/uL (ref 4.22–5.81)
RDW: 14.8 % (ref 11.5–15.5)
WBC: 22.1 10*3/uL — ABNORMAL HIGH (ref 4.0–10.5)
nRBC: 0 % (ref 0.0–0.2)

## 2023-01-29 LAB — COMPREHENSIVE METABOLIC PANEL
ALT: 18 U/L (ref 0–44)
AST: 21 U/L (ref 15–41)
Albumin: 3.3 g/dL — ABNORMAL LOW (ref 3.5–5.0)
Alkaline Phosphatase: 33 U/L — ABNORMAL LOW (ref 38–126)
Anion gap: 9 (ref 5–15)
BUN: 17 mg/dL (ref 6–20)
CO2: 29 mmol/L (ref 22–32)
Calcium: 9 mg/dL (ref 8.9–10.3)
Chloride: 98 mmol/L (ref 98–111)
Creatinine, Ser: 0.8 mg/dL (ref 0.61–1.24)
GFR, Estimated: >60 mL/min (ref >60)
Glucose, Bld: 123 mg/dL — ABNORMAL HIGH (ref 70–99)
Potassium: 4.8 mmol/L (ref 3.5–5.1)
Sodium: 136 mmol/L (ref 135–145)
Total Bilirubin: 0.4 mg/dL (ref 0.3–1.2)
Total Protein: 5.9 g/dL — ABNORMAL LOW (ref 6.5–8.1)

## 2023-01-29 LAB — PHOSPHORUS: Phosphorus: 2.8 mg/dL (ref 2.5–4.6)

## 2023-01-29 LAB — MAGNESIUM: Magnesium: 2.3 mg/dL (ref 1.7–2.4)

## 2023-01-29 MED ORDER — FUROSEMIDE 10 MG/ML IJ SOLN
20.0000 mg | Freq: Once | INTRAMUSCULAR | Status: AC
  Administered 2023-01-29: 20 mg via INTRAVENOUS
  Filled 2023-01-29: qty 2

## 2023-01-29 NOTE — Plan of Care (Signed)

## 2023-01-29 NOTE — Progress Notes (Signed)
PROGRESS NOTE    Franklin Chavez  [id number] DOB: [birth year] DOA: 01/27/2023 PCP: [facility]   Brief Narrative:  The patient is a 62 year old African-American male with past medical history significant for but not limited to hypertension, diastolic CHF, COPD, GAD, polysubstance abuse including cocaine, THC and tobacco as well as other comorbidities who presents with increased shortness of breath.  He was recently admitted from 2/6-2/8 with acute hypoxic respiratory failure secondary to COPD requiring 3 L.  He is prescribed prednisone for 3 more days after discharge however symptoms started returning after steroid course was completed.  He has had increased dry cough, increased shortness of breath and has been using his albuterol [place] at home and continues to have tobacco abuse and has been cutting down to 2 cigarettes daily.  He last used cocaine and THC the day before yesterday.  Does not take any other home medications for his hypertension or heart failure.  In the ED he was noted to be afebrile leukocytosis 12.8 BMP was unremarkable.  EKG showed LVH and elevated J-point to V3.  He was administered 125 mg of Solu-Medrol and breathing treatments with EMS and then received additional DuoNeb's in the ED.  The hospitalist was consulted for continual management of COPD exacerbation as well as acute respiratory failure with hypoxia.   Assessment and Plan: Acute respiratory failure with Hypoxia in setting of COPD exacerbation -Does not wear Supplemental O2 at home -.SpO2: 97 % O2 Flow Rate (L/min): 2 L/min -Continuous Pulse Oximetry and Maintain O2 Saturations >90% -Continue Supplemental O2 via [place] and [place]an O2 as Tolerated -Will need an Ambulatory Home O2 Screen prior to D/C and repeat chest x-ray in a.m. -Treatment as below   *Acute COPD exacerbation (HCC) -Likely secondary to continual tobacco and cocaine use -duoneb q6hr PRN, albuterol q4 PRN have now added Xopenex  and Atrovent scheduled every 6 as well as guaifenesin 1200 mg p.o. twice daily, doxycycline 100 mg twice daily, incentive spirometry as well as flutter valve -Given his continued wheezing will hold his prednisone and resume IV Solu-Medrol 40 twice daily and may need to increase the dose -solu-medrol IV tomorrow with transition to prednisone the following day -COVID and influenza A and B were not checked on admission so we will obtain these and also obtaining respiratory virus panel and these are all been done and NEGATIVE -WBC Trend: Recent Labs  Lab 01/09/23 0750 01/10/23 0650 01/11/23 0514 01/27/23 2220 01/28/23 0702 01/29/23 0340  WBC 11.1* 21.0* 20.1* 12.9* 10.1 22.1*  -Continue monitor and if not improving will consider obtaining a CTA of the chest -CXR today showed "Heart size is normal. Mediastinal shadows are normal. Background pattern emphysema, upper lobe predominant. No evidence of infiltrate, collapse or effusion." -Patient does have a pulmonary appointment on Wednesday   Tobacco Abuse -Continues to smoke about 2 cigarettes daily  -If necessary will place him on a nicotine patch -Smoking cessation counseling given   GAD (generalized anxiety disorder) -Not currently taking his medication. Previously prescribed Buspar. -Continue to monitor   Cocaine Abuse ([place]) -Reports last use Day before admission -Counseling given -UDS is pending    Chronic Diastolic CHF (congestive heart failure) (HCC) -Currently appears Euvolemic  -BNP was 19.5 -If he continues to be dyspneic on exertion may consider repeating an echocardiogram given his recent cocaine abuse -Does not appear overtly overloaded  -Recent ECHO from 12/23 done and showed LV Fxn of 60-65% with No regional wall motion abnormaliteis but there  was moderate concentric LVH and Left Ventricular Diastolic Parameters were consistent with Grade II Diastolic Dysfunction -Continue to monitor strict I's and O's and daily weights  and will try IV Lasix 20 mg x 1   Leukocytosis -Likely due to recent steroid use -WBC Trend: Recent Labs  Lab 01/09/23 0750 01/10/23 0650 01/11/23 0514 01/27/23 2220 01/28/23 0702 01/29/23 0340  WBC 11.1* 21.0* 20.1* 12.9* 10.1 22.1*  -Continue to Monitor for S/Sx of Infection -Repeat CBC in the AM    HTN (Hypertension) -Reports non-compliance with his medications -BP is elevated to 150/90s on Admit -Resume Amlodipine 10 mg po Daily for now and Continue to monitor BP per Protocol -Last BP reading was 133/89   Hypoalbuminemia -Patient's Albumin Trend: Recent Labs  Lab 01/09/23 0750 01/10/23 0650 01/28/23 0702 01/29/23 0340  ALBUMIN 3.6 3.2* 2.7* 3.3*  -Continue to Monitor and Trend and repeat CMP in the AM   DVT prophylaxis: enoxaparin (LOVENOX) injection 40 mg Start: 01/28/23 1000    Code Status: Full Code Family Communication: No family currently at bedside  Disposition Plan:  Level of care: Med-Surg Status is: Inpatient Remains inpatient appropriate because: Continues to be dyspneic and needs further clinical improvement prior to safe discharge disposition   Consultants:  None  Procedures:  As delineated as above  Antimicrobials:  Anti-infectives (From admission, onward)    Start     Dose/Rate Route Frequency Ordered Stop   01/28/23 1230  doxycycline (VIBRA-TABS) tablet 100 mg        100 mg Oral Every 12 hours 01/28/23 1136         Subjective: Seen and examined at bedside and still felt dyspneic but thinks he is doing a little bit better.  Wanted to rest and was little sleepy.  No chest pain.  States that he is coughing up quite a bit of sputum.  No other concerns at this time.  Objective: Vitals:   01/29/23 0835 01/29/23 0940 01/29/23 0950 01/29/23 1314  BP:    133/89  Pulse:    69  Resp:    (!) 21  Temp:    98.3 F (36.8 C)  TempSrc:    Oral  SpO2: 95% (!) 87% 95% 97%  Weight:      Height:        Intake/Output Summary (Last 24 hours) at  01/29/2023 1439 Last data filed at 01/29/2023 [id number] Gross per 24 hour  Intake 477 ml  Output 1400 ml  Net -923 ml   Filed Weights   01/27/23 2016 01/28/23 0511  Weight: 68 kg 68 kg   Examination: Physical Exam:  Constitutional: Thin African-American male currently no acute distress Respiratory: Diminished to auscultation bilaterally with coarse breath sounds and has some rhonchi noted as well as some slight wheezing.  No appreciable crackles or rales.  Wearing supplemental oxygen nasal cannula and is mildly tachypneic Cardiovascular: RRR, no murmurs / rubs / gallops. S1 and S2 auscultated. No extremity edema.  Abdomen: Soft, non-tender, non-distended. Bowel sounds positive.  GU: Deferred. Musculoskeletal: No clubbing / cyanosis of digits/nails. No joint deformity upper and lower extremities.  Skin: No rashes, lesions, ulcers on a limited skin evaluation. No induration; Warm and dry.  Neurologic: CN 2-12 grossly intact with no focal deficits. Romberg sign and cerebellar reflexes not assessed.  Psychiatric: Normal judgment and insight. Alert and oriented x 3. Normal mood and appropriate affect.   Data Reviewed: I have personally reviewed following labs and imaging studies  CBC: Recent Labs  Lab 01/27/23 2220 01/28/23 0702 01/29/23 0340  WBC 12.9* 10.1 22.1*  NEUTROABS 11.1*  --  20.6*  HGB 14.8 14.9 14.5  HCT 44.2 45.0 42.3  MCV 86.8 86.4 84.9  PLT 264 280 0000000   Basic Metabolic Panel: Recent Labs  Lab 01/27/23 2056 01/28/23 0702 01/29/23 0340  NA 138 138 136  K 3.7 4.5 4.8  CL 101 99 98  CO2 '29 30 29  '$ GLUCOSE 113* 134* 123*  BUN 7 <5* 17  CREATININE 0.85 0.90 0.80  CALCIUM 8.3* 8.8* 9.0  MG  --  2.2 2.3  PHOS  --  4.6 2.8   GFR: Estimated Creatinine Clearance: 94.4 mL/min (by C-G formula based on SCr of 0.8 mg/dL). Liver Function Tests: Recent Labs  Lab 01/28/23 0702 01/29/23 0340  AST 23 21  ALT 13 18  ALKPHOS 35* 33*  BILITOT 0.7 0.4  PROT 5.4* 5.9*   ALBUMIN 2.7* 3.3*   No results for input(s): "LIPASE", "AMYLASE" in the last 168 hours. No results for input(s): "AMMONIA" in the last 168 hours. Coagulation Profile: No results for input(s): "INR", "PROTIME" in the last 168 hours. Cardiac Enzymes: No results for input(s): "CKTOTAL", "CKMB", "CKMBINDEX", "TROPONINI" in the last 168 hours. BNP (last 3 results) No results for input(s): "PROBNP" in the last 8760 hours. HbA1C: No results for input(s): "HGBA1C" in the last 72 hours. CBG: No results for input(s): "GLUCAP" in the last 168 hours. Lipid Profile: No results for input(s): "CHOL", "HDL", "LDLCALC", "TRIG", "CHOLHDL", "LDLDIRECT" in the last 72 hours. Thyroid Function Tests: No results for input(s): "TSH", "T4TOTAL", "FREET4", "T3FREE", "THYROIDAB" in the last 72 hours. Anemia Panel: No results for input(s): "VITAMINB12", "FOLATE", "FERRITIN", "TIBC", "IRON", "RETICCTPCT" in the last 72 hours. Sepsis Labs: No results for input(s): "PROCALCITON", "LATICACIDVEN" in the last 168 hours.  Recent Results (from the past 240 hour(s))  Resp panel by RT-PCR (RSV, Flu A&B, Covid) Anterior Nasal Swab     Status: None   Collection Time: 01/28/23  3:01 PM   Specimen: Anterior Nasal Swab  Result Value Ref Range Status   SARS Coronavirus 2 by RT PCR NEGATIVE NEGATIVE Final    Comment: (NOTE) SARS-CoV-2 target nucleic acids are NOT DETECTED.  The SARS-CoV-2 RNA is generally detectable in upper respiratory specimens during the acute phase of infection. The lowest concentration of SARS-CoV-2 viral copies this assay can detect is 138 copies/mL. A negative result does not preclude SARS-Cov-2 infection and should not be used as the sole basis for treatment or other patient management decisions. A negative result may occur with  improper specimen collection/handling, submission of specimen other than nasopharyngeal swab, presence of viral mutation(s) within the areas targeted by this assay,  and inadequate number of viral copies(<138 copies/mL). A negative result must be combined with clinical observations, patient history, and epidemiological information. The expected result is Negative.  Fact Sheet for Patients:  EntrepreneurPulse.com.au  Fact Sheet for Healthcare Providers:  IncredibleEmployment.be  This test is no t yet approved or cleared by the [place] FDA and  has been authorized for detection and/or diagnosis of SARS-CoV-2 by FDA under an Emergency Use Authorization (EUA). This EUA will remain  in effect (meaning this test can be used) for the duration of the COVID-19 declaration under Section 564(b)(1) of the Act, 21 U.S.C.section 360bbb-3(b)(1), unless the authorization is terminated  or revoked sooner.       Influenza A by PCR NEGATIVE NEGATIVE Final   Influenza B by PCR NEGATIVE NEGATIVE Final  Comment: (NOTE) The Xpert Xpress SARS-CoV-2/FLU/RSV plus assay is intended as an aid in the diagnosis of influenza from Nasopharyngeal swab specimens and should not be used as a sole basis for treatment. Nasal washings and aspirates are unacceptable for Xpert Xpress SARS-CoV-2/FLU/RSV testing.  Fact Sheet for Patients: EntrepreneurPulse.com.au  Fact Sheet for Healthcare Providers: IncredibleEmployment.be  This test is not yet approved or cleared by the [place] FDA and has been authorized for detection and/or diagnosis of SARS-CoV-2 by FDA under an Emergency Use Authorization (EUA). This EUA will remain in effect (meaning this test can be used) for the duration of the COVID-19 declaration under Section 564(b)(1) of the Act, 21 U.S.C. section 360bbb-3(b)(1), unless the authorization is terminated or revoked.     Resp Syncytial Virus by PCR NEGATIVE NEGATIVE Final    Comment: (NOTE) Fact Sheet for Patients: EntrepreneurPulse.com.au  Fact Sheet for  Healthcare Providers: IncredibleEmployment.be  This test is not yet approved or cleared by the [place] FDA and has been authorized for detection and/or diagnosis of SARS-CoV-2 by FDA under an Emergency Use Authorization (EUA). This EUA will remain in effect (meaning this test can be used) for the duration of the COVID-19 declaration under Section 564(b)(1) of the Act, 21 U.S.C. section 360bbb-3(b)(1), unless the authorization is terminated or revoked.  Performed at [facility], [place] [address], [place], [place] [address]   Respiratory (~20 pathogens) panel by PCR     Status: None   Collection Time: 01/28/23  3:01 PM   Specimen: Nasopharyngeal Swab; Respiratory  Result Value Ref Range Status   Adenovirus NOT DETECTED NOT DETECTED Final   Coronavirus 229E NOT DETECTED NOT DETECTED Final    Comment: (NOTE) The Coronavirus on the Respiratory Panel, DOES NOT test for the novel  Coronavirus (2019 nCoV)    Coronavirus HKU1 NOT DETECTED NOT DETECTED Final   Coronavirus NL63 NOT DETECTED NOT DETECTED Final   Coronavirus OC43 NOT DETECTED NOT DETECTED Final   Metapneumovirus NOT DETECTED NOT DETECTED Final   Rhinovirus / Enterovirus NOT DETECTED NOT DETECTED Final   Influenza A NOT DETECTED NOT DETECTED Final   Influenza B NOT DETECTED NOT DETECTED Final   Parainfluenza Virus 1 NOT DETECTED NOT DETECTED Final   Parainfluenza Virus 2 NOT DETECTED NOT DETECTED Final   Parainfluenza Virus 3 NOT DETECTED NOT DETECTED Final   Parainfluenza Virus 4 NOT DETECTED NOT DETECTED Final   Respiratory Syncytial Virus NOT DETECTED NOT DETECTED Final   Bordetella pertussis NOT DETECTED NOT DETECTED Final   Bordetella Parapertussis NOT DETECTED NOT DETECTED Final   Chlamydophila pneumoniae NOT DETECTED NOT DETECTED Final   Mycoplasma pneumoniae NOT DETECTED NOT DETECTED Final    Comment: Performed at [facility], [place]. [address] [place], [place]  [address]    Radiology Studies: DG CHEST PORT 1 VIEW  Result Date: 01/29/2023 CLINICAL DATA:  Shortness of breath EXAM: PORTABLE CHEST 1 VIEW COMPARISON:  01/27/2023 FINDINGS: Heart size is normal. Mediastinal shadows are normal. Background pattern emphysema, upper lobe predominant. No evidence of infiltrate, collapse or effusion. IMPRESSION: Emphysema. No acute or focal finding. Electronically Signed   By: Nelson Chimes M.D.   On: 01/29/2023 08:01   DG Chest Port 1 View  Result Date: 01/27/2023 CLINICAL DATA:  Shortness of breath, wheezing and COPD. EXAM: PORTABLE CHEST 1 VIEW COMPARISON:  Portable chest 01/09/2023. FINDINGS: The heart size and mediastinal contours are within normal limits. Both lungs are emphysematous but clear. The visualized skeletal structures are  unremarkable apart from slight thoracic dextroscoliosis. IMPRESSION: No evidence of acute chest disease.  Stable COPD chest. Electronically Signed   By: Telford Nab M.D.   On: 01/27/2023 21:13    Scheduled Meds:  amLODipine  10 mg Oral Daily   doxycycline  100 mg Oral Q12H   enoxaparin (LOVENOX) injection  40 mg Subcutaneous Q24H   guaiFENesin  1,200 mg Oral BID   ipratropium  0.5 mg Nebulization BID   levalbuterol  0.63 mg Nebulization BID   methylPREDNISolone (SOLU-MEDROL) injection  40 mg Intravenous Q12H   Continuous Infusions:   LOS: 1 day   Raiford Noble, DO [facility] Available via Epic secure chat 7am-7pm After these hours, please refer to coverage provider listed on [email or web address] 01/29/2023, 2:39 PM

## 2023-01-30 ENCOUNTER — Inpatient Hospital Stay (HOSPITAL_COMMUNITY): Payer: Medicaid Other

## 2023-01-30 DIAGNOSIS — J441 Chronic obstructive pulmonary disease with (acute) exacerbation: Secondary | ICD-10-CM | POA: Diagnosis not present

## 2023-01-30 DIAGNOSIS — Z720 Tobacco use: Secondary | ICD-10-CM | POA: Diagnosis not present

## 2023-01-30 DIAGNOSIS — I5032 Chronic diastolic (congestive) heart failure: Secondary | ICD-10-CM | POA: Diagnosis not present

## 2023-01-30 DIAGNOSIS — F1410 Cocaine abuse, uncomplicated: Secondary | ICD-10-CM | POA: Diagnosis not present

## 2023-01-30 LAB — CBC WITH DIFFERENTIAL/PLATELET
Abs Immature Granulocytes: 0.16 10*3/uL — ABNORMAL HIGH (ref 0.00–0.07)
Basophils Absolute: 0 10*3/uL (ref 0.0–0.1)
Basophils Relative: 0 %
Eosinophils Absolute: 0 10*3/uL (ref 0.0–0.5)
Eosinophils Relative: 0 %
HCT: 43.9 % (ref 39.0–52.0)
Hemoglobin: 14.7 g/dL (ref 13.0–17.0)
Immature Granulocytes: 1 %
Lymphocytes Relative: 3 %
Lymphs Abs: 0.7 10*3/uL (ref 0.7–4.0)
MCH: 28.6 pg (ref 26.0–34.0)
MCHC: 33.5 g/dL (ref 30.0–36.0)
MCV: 85.4 fL (ref 80.0–100.0)
Monocytes Absolute: 0.5 10*3/uL (ref 0.1–1.0)
Monocytes Relative: 2 %
Neutro Abs: 20.5 10*3/uL — ABNORMAL HIGH (ref 1.7–7.7)
Neutrophils Relative %: 94 %
Platelets: 324 10*3/uL (ref 150–400)
RBC: 5.14 MIL/uL (ref 4.22–5.81)
RDW: 14.9 % (ref 11.5–15.5)
WBC: 21.9 10*3/uL — ABNORMAL HIGH (ref 4.0–10.5)
nRBC: 0 % (ref 0.0–0.2)

## 2023-01-30 LAB — COMPREHENSIVE METABOLIC PANEL
ALT: 19 U/L (ref 0–44)
AST: 18 U/L (ref 15–41)
Albumin: 3 g/dL — ABNORMAL LOW (ref 3.5–5.0)
Alkaline Phosphatase: 29 U/L — ABNORMAL LOW (ref 38–126)
Anion gap: 6 (ref 5–15)
BUN: 19 mg/dL (ref 6–20)
CO2: 32 mmol/L (ref 22–32)
Calcium: 8.9 mg/dL (ref 8.9–10.3)
Chloride: 99 mmol/L (ref 98–111)
Creatinine, Ser: 0.89 mg/dL (ref 0.61–1.24)
GFR, Estimated: >60 mL/min (ref >60)
Glucose, Bld: 118 mg/dL — ABNORMAL HIGH (ref 70–99)
Potassium: 4.2 mmol/L (ref 3.5–5.1)
Sodium: 137 mmol/L (ref 135–145)
Total Bilirubin: 0.7 mg/dL (ref 0.3–1.2)
Total Protein: 5.3 g/dL — ABNORMAL LOW (ref 6.5–8.1)

## 2023-01-30 LAB — PHOSPHORUS: Phosphorus: 2.3 mg/dL — ABNORMAL LOW (ref 2.5–4.6)

## 2023-01-30 LAB — MAGNESIUM: Magnesium: 2.2 mg/dL (ref 1.7–2.4)

## 2023-01-30 MED ORDER — ENSURE ENLIVE PO LIQD
237.0000 mL | Freq: Two times a day (BID) | ORAL | Status: DC
  Administered 2023-01-30 – 2023-02-01 (×3): 237 mL via ORAL

## 2023-01-30 MED ORDER — ADULT MULTIVITAMIN W/MINERALS CH
1.0000 | ORAL_TABLET | Freq: Every day | ORAL | Status: DC
  Administered 2023-01-30 – 2023-02-01 (×3): 1 via ORAL
  Filled 2023-01-30 (×3): qty 1

## 2023-01-30 MED ORDER — K PHOS MONO-SOD PHOS DI & MONO 155-852-130 MG PO TABS
500.0000 mg | ORAL_TABLET | Freq: Two times a day (BID) | ORAL | Status: AC
  Administered 2023-01-30 (×2): 500 mg via ORAL
  Filled 2023-01-30 (×2): qty 2

## 2023-01-30 NOTE — Progress Notes (Signed)
Initial Nutrition Assessment  DOCUMENTATION CODES:   Non-severe (moderate) malnutrition in context of chronic illness  INTERVENTION:   -Ensure Plus High Protein po BID, each supplement provides 350 kcal and 20 grams of protein.   -Multivitamin with minerals daily  NUTRITION DIAGNOSIS:   Moderate Malnutrition related to chronic illness (COPD, substance abuse) as evidenced by percent weight loss, mild muscle depletion.  GOAL:   Patient will meet greater than or equal to 90% of their needs  MONITOR:   PO intake, Supplement acceptance, Labs, Weight trends, I & O's  REASON FOR ASSESSMENT:   Malnutrition Screening Tool    ASSESSMENT:   62 year old African-American male with past medical history significant for but not limited to hypertension, diastolic CHF, COPD, GAD, polysubstance abuse including cocaine, THC and tobacco as well as other comorbidities who presents with increased shortness of breath.  He was recently admitted from 2/6-2/8 with acute hypoxic respiratory failure secondary to COPD requiring 3 L.  Patient in room. States he is eating well now, the steroids have helped his appetite increase. Currently consuming 100% of meals at this time.  Pt agreeable to receiving protein supplements given recent weight loss and pt reports he wasn't eating as well PTA.   Per weight records, pt has lost 15 lbs since 11/25 (8% wt loss x 3 months, significant for time frame).   Medications: K-Phos  Labs reviewed: Low Phos   NUTRITION - FOCUSED PHYSICAL EXAM:  Flowsheet Row Most Recent Value  Orbital Region No depletion  Upper Arm Region Mild depletion  Thoracic and Lumbar Region No depletion  Buccal Region No depletion  Temple Region No depletion  Clavicle Bone Region Mild depletion  Clavicle and Acromion Bone Region Mild depletion  Scapular Bone Region No depletion  Dorsal Hand No depletion  Patellar Region No depletion  Anterior Thigh Region No depletion  Posterior Calf  Region No depletion  Edema (RD Assessment) None  Hair Reviewed  Eyes Reviewed  Mouth Reviewed  Skin Reviewed       Diet Order:   Diet Order             Diet Heart Room service appropriate? Yes; Fluid consistency: Thin  Diet effective now                   EDUCATION NEEDS:   No education needs have been identified at this time  Skin:  Skin Assessment: Reviewed RN Assessment  Last BM:  2/26  Height:   Ht Readings from Last 1 Encounters:  01/28/23 6' (1.829 m)    Weight:   Wt Readings from Last 1 Encounters:  01/30/23 70 kg    BMI:  Body mass index is 20.93 kg/m.  Estimated Nutritional Needs:   Kcal:  2050-2250  Protein:  100-115g  Fluid:  2L/day  Clayton Bibles, MS, RD, LDN Inpatient Clinical Dietitian Contact information available via Amion

## 2023-01-30 NOTE — Progress Notes (Signed)
PROGRESS NOTE    Franklin Chavez  [id number] DOB: [birth year] DOA: 01/27/2023 PCP: [facility]   Brief Narrative:  The patient is a 62 year old African-American male with past medical history significant for but not limited to hypertension, diastolic CHF, COPD, GAD, polysubstance abuse including cocaine, THC and tobacco as well as other comorbidities who presents with increased shortness of breath.  He was recently admitted from 2/6-2/8 with acute hypoxic respiratory failure secondary to COPD requiring 3 L.  He is prescribed prednisone for 3 more days after discharge however symptoms started returning after steroid course was completed.  He has had increased dry cough, increased shortness of breath and has been using his albuterol [place] at home and continues to have tobacco abuse and has been cutting down to 2 cigarettes daily.  He last used cocaine and THC the day before yesterday.  Does not take any other home medications for his hypertension or heart failure.  In the ED he was noted to be afebrile leukocytosis 12.8 BMP was unremarkable.  EKG showed LVH and elevated J-point to V3.  He was administered 125 mg of Solu-Medrol and breathing treatments with EMS and then received additional DuoNeb's in the ED.  The hospitalist was consulted for continual management of COPD exacerbation as well as acute respiratory failure with hypoxia.  He is very slow to improve and if not improving further tomorrow will need to discuss with pulmonary.  Assessment and Plan: Acute respiratory failure with Hypoxia in setting of COPD exacerbation -Does not wear Supplemental O2 at home -SpO2: 98 % O2 Flow Rate (L/min): 2 L/min -Continuous Pulse Oximetry and Maintain O2 Saturations >90% -Continue Supplemental O2 via [place] and Wean O2 as Tolerated -Will need an Ambulatory Home O2 Screen prior to D/C and repeat chest x-ray in a.m. -Treatment as below   *Acute COPD exacerbation (HCC) -Likely  secondary to continual tobacco and cocaine use -duoneb q6hr PRN, albuterol q4 PRN have now added Xopenex and Atrovent scheduled every 6 as well as guaifenesin 1200 mg p.o. twice daily, doxycycline 100 mg twice daily, incentive spirometry as well as flutter valve -Given his continued wheezing will hold his prednisone and resume IV Solu-Medrol 40 twice daily and may need to increase the dose -solu-medrol IV tomorrow with transition to prednisone the following day -COVID and influenza A and B were not checked on admission so we will obtain these and also obtaining respiratory virus panel and these are all been done and NEGATIVE -WBC Trend: Recent Labs  Lab 01/09/23 0750 01/10/23 0650 01/11/23 0514 01/27/23 2220 01/28/23 0702 01/29/23 0340 01/30/23 0425  WBC 11.1* 21.0* 20.1* 12.9* 10.1 22.1* 21.9*  -Continue monitor and if not improving will consider obtaining a CTA of the chest -CXR today showed "Normal heart size, mediastinal contours, and pulmonary vascularity.   Minimal bibasilar atelectasis. Lungs otherwise clear. No pulmonary infiltrate, pleural effusion, or pneumothorax." -Patient does have a pulmonary appointment on Wednesday but may need in-house consult given his slow improvement and lack of progress   Tobacco Abuse -Continues to smoke about 2 cigarettes daily  -If necessary will place him on a nicotine patch -Smoking cessation counseling given   GAD (generalized anxiety disorder) -Not currently taking his medication. Previously prescribed Buspar. -Continue to monitor   Cocaine Abuse ([place]) -Reports last use Day before admission -Counseling given -UDS is pending still   Hypophosphatemia -Patient's Phos Level Trend: Recent Labs  Lab 01/28/23 0702 01/29/23 0340 01/30/23 0425  PHOS 4.6 2.8 2.3*  -  Replete With p.o. K-Phos Neutral 500 g p.o. twice daily -Repeat Phos Level in the AM   Chronic Diastolic CHF (congestive heart failure) ([place]) -Currently appears Euvolemic   -BNP was 19.5 -If he continues to be dyspneic on exertion may consider repeating an echocardiogram given his recent cocaine abuse -Does not appear overtly overloaded  -Recent ECHO from 12/23 done and showed LV Fxn of 60-65% with No regional wall motion abnormaliteis but there was moderate concentric LVH and Left Ventricular Diastolic Parameters were consistent with Grade II Diastolic Dysfunction -Continue to monitor strict I's and O's and daily weights and will try IV Lasix 20 mg x 1 - Intake/Output Summary (Last 24 hours) at 01/30/2023 1905 Last data filed at 01/30/2023 1802 Gross per 24 hour  Intake 2515 ml  Output 1275 ml  Net 1240 ml  -Continue to monitor for signs and symptoms of volume overload   Leukocytosis -Likely due to recent steroid use -WBC Trend: Recent Labs  Lab 01/09/23 0750 01/10/23 0650 01/11/23 0514 01/27/23 2220 01/28/23 0702 01/29/23 0340 01/30/23 0425  WBC 11.1* 21.0* 20.1* 12.9* 10.1 22.1* 21.9*  -Continue to Monitor for S/Sx of Infection -Repeat CBC in the AM    HTN (Hypertension) -Reports non-compliance with his medications -BP is elevated to 150/90s on Admit -Resume Amlodipine 10 mg po Daily for now and Continue to monitor BP per Protocol -Last BP reading was 150/91 today    Hypoalbuminemia -Patient's Albumin Trend: Recent Labs  Lab 01/09/23 0750 01/10/23 0650 01/28/23 0702 01/29/23 0340 01/30/23 0425  ALBUMIN 3.6 3.2* 2.7* 3.3* 3.0*  -Continue to Monitor and Trend and repeat CMP in the AM  Non-severe (Moderate) Malnutrition in the context of chronic illness -Nutritionist consulted  -Nutrition Status: Nutrition Problem: Moderate Malnutrition Etiology: chronic illness (COPD, substance abuse) Signs/Symptoms: percent weight loss, mild muscle depletion Interventions: Ensure Enlive (each supplement provides 350kcal and 20 grams of protein), MVI  DVT prophylaxis: enoxaparin (LOVENOX) injection 40 mg Start: 01/28/23 1000    Code Status:  Full Code Family Communication: No family currently at bedside  Disposition Plan:  Level of care: Med-Surg Status is: Inpatient Remains inpatient appropriate because: His further clinical improvement and will need an amatory home O2 screen prior to discharge   Consultants:  None  Procedures:  As delineated as above  Antimicrobials:  Anti-infectives (From admission, onward)    Start     Dose/Rate Route Frequency Ordered Stop   01/28/23 1230  doxycycline (VIBRA-TABS) tablet 100 mg        100 mg Oral Every 12 hours 01/28/23 1136         Subjective: Seen and examined at bedside he still feels short of breath but thinks he is very slowly improving.  No nausea or vomiting.  Continues to cough up some yellow sputum.  Denies any chest pain.  No other concerns or complaints this time.  Objective: Vitals:   01/30/23 0423 01/30/23 0500 01/30/23 0720 01/30/23 1247  BP: (!) 154/93   (!) 150/91  Pulse: (!) 57   61  Resp: 20   20  Temp: 98.7 F (37.1 C)   98 F (36.7 C)  TempSrc: Oral   Oral  SpO2: 92%  97% 98%  Weight:  70 kg    Height:        Intake/Output Summary (Last 24 hours) at 01/30/2023 1903 Last data filed at 01/30/2023 1802 Gross per 24 hour  Intake 2515 ml  Output 1275 ml  Net 1240 ml   Danley Danker  Weights   01/27/23 2016 01/28/23 0511 01/30/23 0500  Weight: 68 kg 68 kg 70 kg   Examination: Physical Exam:  Constitutional: Thin African-American male in no acute distress but appears a little uncomfortable Respiratory: Diminished to auscultation bilaterally with coarse breath sounds and has some slight rhonchi and mild wheezing.  No appreciable rales or crackles. Normal respiratory effort and patient is not tachypenic. No accessory muscle use.  Continues to wear supplemental oxygen via nasal cannula Cardiovascular: RRR, no murmurs / rubs / gallops. S1 and S2 auscultated. No extremity edema.  Abdomen: Soft, non-tender, non-distended.  Bowel sounds positive.  GU:  Deferred. Musculoskeletal: No clubbing / cyanosis of digits/nails. No joint deformity upper and lower extremities.  Skin: No rashes, lesions, ulcers.  Limited skin evaluation. No induration; Warm and dry.  Neurologic: CN 2-12 grossly intact with no focal deficits. Romberg sign and cerebellar reflexes not assessed.  Psychiatric: Normal judgment and insight. Alert and oriented x 3. Normal mood and appropriate affect.   Data Reviewed: I have personally reviewed following labs and imaging studies  CBC: Recent Labs  Lab 01/27/23 2220 01/28/23 0702 01/29/23 0340 01/30/23 0425  WBC 12.9* 10.1 22.1* 21.9*  NEUTROABS 11.1*  --  20.6* 20.5*  HGB 14.8 14.9 14.5 14.7  HCT 44.2 45.0 42.3 43.9  MCV 86.8 86.4 84.9 85.4  PLT 264 280 296 0000000   Basic Metabolic Panel: Recent Labs  Lab 01/27/23 2056 01/28/23 0702 01/29/23 0340 01/30/23 0425  NA 138 138 136 137  K 3.7 4.5 4.8 4.2  CL 101 99 98 99  CO2 '29 30 29 '$ 32  GLUCOSE 113* 134* 123* 118*  BUN 7 <5* 17 19  CREATININE 0.85 0.90 0.80 0.89  CALCIUM 8.3* 8.8* 9.0 8.9  MG  --  2.2 2.3 2.2  PHOS  --  4.6 2.8 2.3*   GFR: Estimated Creatinine Clearance: 87.4 mL/min (by C-G formula based on SCr of 0.89 mg/dL). Liver Function Tests: Recent Labs  Lab 01/28/23 0702 01/29/23 0340 01/30/23 0425  AST '23 21 18  '$ ALT '13 18 19  '$ ALKPHOS 35* 33* 29*  BILITOT 0.7 0.4 0.7  PROT 5.4* 5.9* 5.3*  ALBUMIN 2.7* 3.3* 3.0*   No results for input(s): "LIPASE", "AMYLASE" in the last 168 hours. No results for input(s): "AMMONIA" in the last 168 hours. Coagulation Profile: No results for input(s): "INR", "PROTIME" in the last 168 hours. Cardiac Enzymes: No results for input(s): "CKTOTAL", "CKMB", "CKMBINDEX", "TROPONINI" in the last 168 hours. BNP (last 3 results) No results for input(s): "PROBNP" in the last 8760 hours. HbA1C: No results for input(s): "HGBA1C" in the last 72 hours. CBG: No results for input(s): "GLUCAP" in the last 168 hours. Lipid  Profile: No results for input(s): "CHOL", "HDL", "LDLCALC", "TRIG", "CHOLHDL", "LDLDIRECT" in the last 72 hours. Thyroid Function Tests: No results for input(s): "TSH", "T4TOTAL", "FREET4", "T3FREE", "THYROIDAB" in the last 72 hours. Anemia Panel: No results for input(s): "VITAMINB12", "FOLATE", "FERRITIN", "TIBC", "IRON", "RETICCTPCT" in the last 72 hours. Sepsis Labs: No results for input(s): "PROCALCITON", "LATICACIDVEN" in the last 168 hours.  Recent Results (from the past 240 hour(s))  Resp panel by RT-PCR (RSV, Flu A&B, Covid) Anterior Nasal Swab     Status: None   Collection Time: 01/28/23  3:01 PM   Specimen: Anterior Nasal Swab  Result Value Ref Range Status   SARS Coronavirus 2 by RT PCR NEGATIVE NEGATIVE Final    Comment: (NOTE) SARS-CoV-2 target nucleic acids are NOT DETECTED.  The SARS-CoV-2  RNA is generally detectable in upper respiratory specimens during the acute phase of infection. The lowest concentration of SARS-CoV-2 viral copies this assay can detect is 138 copies/mL. A negative result does not preclude SARS-Cov-2 infection and should not be used as the sole basis for treatment or other patient management decisions. A negative result may occur with  improper specimen collection/handling, submission of specimen other than nasopharyngeal swab, presence of viral mutation(s) within the areas targeted by this assay, and inadequate number of viral copies(<138 copies/mL). A negative result must be combined with clinical observations, patient history, and epidemiological information. The expected result is Negative.  Fact Sheet for Patients:  EntrepreneurPulse.com.au  Fact Sheet for Healthcare Providers:  IncredibleEmployment.be  This test is no t yet approved or cleared by the [place] FDA and  has been authorized for detection and/or diagnosis of SARS-CoV-2 by FDA under an Emergency Use Authorization (EUA). This EUA will  remain  in effect (meaning this test can be used) for the duration of the COVID-19 declaration under Section 564(b)(1) of the Act, 21 U.S.C.section 360bbb-3(b)(1), unless the authorization is terminated  or revoked sooner.       Influenza A by PCR NEGATIVE NEGATIVE Final   Influenza B by PCR NEGATIVE NEGATIVE Final    Comment: (NOTE) The Xpert Xpress SARS-CoV-2/FLU/RSV plus assay is intended as an aid in the diagnosis of influenza from Nasopharyngeal swab specimens and should not be used as a sole basis for treatment. Nasal washings and aspirates are unacceptable for Xpert Xpress SARS-CoV-2/FLU/RSV testing.  Fact Sheet for Patients: EntrepreneurPulse.com.au  Fact Sheet for Healthcare Providers: IncredibleEmployment.be  This test is not yet approved or cleared by the [place] FDA and has been authorized for detection and/or diagnosis of SARS-CoV-2 by FDA under an Emergency Use Authorization (EUA). This EUA will remain in effect (meaning this test can be used) for the duration of the COVID-19 declaration under Section 564(b)(1) of the Act, 21 U.S.C. section 360bbb-3(b)(1), unless the authorization is terminated or revoked.     Resp Syncytial Virus by PCR NEGATIVE NEGATIVE Final    Comment: (NOTE) Fact Sheet for Patients: EntrepreneurPulse.com.au  Fact Sheet for Healthcare Providers: IncredibleEmployment.be  This test is not yet approved or cleared by the [place] FDA and has been authorized for detection and/or diagnosis of SARS-CoV-2 by FDA under an Emergency Use Authorization (EUA). This EUA will remain in effect (meaning this test can be used) for the duration of the COVID-19 declaration under Section 564(b)(1) of the Act, 21 U.S.C. section 360bbb-3(b)(1), unless the authorization is terminated or revoked.  Performed at [facility], [place] [address] [place] [address], [place], [place]  [address]   Respiratory (~20 pathogens) panel by PCR     Status: None   Collection Time: 01/28/23  3:01 PM   Specimen: Nasopharyngeal Swab; Respiratory  Result Value Ref Range Status   Adenovirus NOT DETECTED NOT DETECTED Final   Coronavirus 229E NOT DETECTED NOT DETECTED Final    Comment: (NOTE) The Coronavirus on the Respiratory Panel, DOES NOT test for the novel  Coronavirus (2019 nCoV)    Coronavirus HKU1 NOT DETECTED NOT DETECTED Final   Coronavirus NL63 NOT DETECTED NOT DETECTED Final   Coronavirus OC43 NOT DETECTED NOT DETECTED Final   Metapneumovirus NOT DETECTED NOT DETECTED Final   Rhinovirus / Enterovirus NOT DETECTED NOT DETECTED Final   Influenza A NOT DETECTED NOT DETECTED Final   Influenza B NOT DETECTED NOT DETECTED Final   Parainfluenza Virus 1 NOT DETECTED NOT  DETECTED Final   Parainfluenza Virus 2 NOT DETECTED NOT DETECTED Final   Parainfluenza Virus 3 NOT DETECTED NOT DETECTED Final   Parainfluenza Virus 4 NOT DETECTED NOT DETECTED Final   Respiratory Syncytial Virus NOT DETECTED NOT DETECTED Final   Bordetella pertussis NOT DETECTED NOT DETECTED Final   Bordetella Parapertussis NOT DETECTED NOT DETECTED Final   Chlamydophila pneumoniae NOT DETECTED NOT DETECTED Final   Mycoplasma pneumoniae NOT DETECTED NOT DETECTED Final    Comment: Performed at [facility], [place] [address], [place], [place] [address]     Radiology Studies: DG CHEST PORT 1 VIEW  Result Date: 01/30/2023 CLINICAL DATA:  Shortness of breath EXAM: PORTABLE CHEST 1 VIEW COMPARISON:  Portable exam 0434 hours compared to 01/29/2023 FINDINGS: Normal heart size, mediastinal contours, and pulmonary vascularity. Minimal bibasilar atelectasis. Lungs otherwise clear. No pulmonary infiltrate, pleural effusion, or pneumothorax. IMPRESSION: Minimal bibasilar atelectasis. Electronically Signed   By: Lavonia Dana M.D.   On: 01/30/2023 08:21   DG CHEST PORT 1 VIEW  Result Date: 01/29/2023 CLINICAL DATA:   Shortness of breath EXAM: PORTABLE CHEST 1 VIEW COMPARISON:  01/27/2023 FINDINGS: Heart size is normal. Mediastinal shadows are normal. Background pattern emphysema, upper lobe predominant. No evidence of infiltrate, collapse or effusion. IMPRESSION: Emphysema. No acute or focal finding. Electronically Signed   By: Nelson Chimes M.D.   On: 01/29/2023 08:01    Scheduled Meds:  amLODipine  10 mg Oral Daily   doxycycline  100 mg Oral Q12H   enoxaparin (LOVENOX) injection  40 mg Subcutaneous Q24H   feeding supplement  237 mL Oral BID BM   guaiFENesin  1,200 mg Oral BID   ipratropium  0.5 mg Nebulization BID   levalbuterol  0.63 mg Nebulization BID   methylPREDNISolone (SOLU-MEDROL) injection  40 mg Intravenous Q12H   multivitamin with minerals  1 tablet Oral Daily   phosphorus  500 mg Oral BID   Continuous Infusions:   LOS: 2 days   Raiford Noble, DO [facility] Available via Epic secure chat 7am-7pm After these hours, please refer to coverage provider listed on [email or web address] 01/30/2023, 7:03 PM

## 2023-01-30 NOTE — Progress Notes (Deleted)
Synopsis: Referred for COPD by Riki Sheer, NP  Subjective:   PATIENT ID: Franklin Chavez Client GENDER: male DOB: 08-Oct-1961, MRN: [id number]  No chief complaint on file.  62yM with history of COPD, HTN, GAD, smoking, polysubstance use referred for COPD  He has had 10 ED visits in the last 4 months for various issues, many for AECOPD. Admissions 2/8 and 2/24 for AHRF, AECOPD  Otherwise pertinent review of systems is negative.  Past Medical History:  Diagnosis Date   COPD (chronic obstructive pulmonary disease) ([place])    Essential hypertension    GAD (generalized anxiety disorder)    Poor dentition 12/13/2018   Tobacco use disorder 12/13/2018     Family History  Problem Relation Age of Onset   Diabetes Mellitus II Mother    Hypertension Mother      No past surgical history on file.  Social History   Socioeconomic History   Marital status: Married    Spouse name: Not on file   Number of children: Not on file   Years of education: Not on file   Highest education level: Not on file  Occupational History   Not on file  Tobacco Use   Smoking status: Every Day   Smokeless tobacco: Never  Substance and Sexual Activity   Alcohol use: No   Drug use: No   Sexual activity: Not on file  Other Topics Concern   Not on file  Social History Narrative   Not on file   Social Determinants of Health   Financial Resource Strain: Not on file  Food Insecurity: Food Insecurity Present (01/28/2023)   Hunger Vital Sign    Worried About Running Out of Food in the Last Year: Sometimes true    Ran Out of Food in the Last Year: Sometimes true  Transportation Needs: Unmet Transportation Needs (01/28/2023)   PRAPARE - Hydrologist (Medical): Yes    Lack of Transportation (Non-Medical): Yes  Physical Activity: Not on file  Stress: Not on file  Social Connections: Not on file  Intimate Partner Violence: Not At Risk (01/28/2023)   Humiliation, Afraid, Rape,  and Kick questionnaire    Fear of Current or Ex-Partner: No    Emotionally Abused: No    Physically Abused: No    Sexually Abused: No     No Known Allergies   Facility-Administered Medications Prior to Visit  Medication Dose Route Frequency Provider Last Rate Last Admin   albuterol (PROVENTIL) (2.5 MG/3ML) 0.083% nebulizer solution 2.5 mg  2.5 mg Nebulization Q4H PRN Tu, Ching T, DO       amLODipine (NORVASC) tablet 10 mg  10 mg Oral Daily Tu, Ching T, DO   10 mg at 01/30/23 [id number]   doxycycline (VIBRA-TABS) tablet 100 mg  100 mg Oral Q12H Sheikh, Georgina Quint [place], DO   100 mg at 01/30/23 [id number]   enoxaparin (LOVENOX) injection 40 mg  40 mg Subcutaneous Q24H Tu, Ching T, DO   40 mg at 01/30/23 [id number]   guaiFENesin (MUCINEX) 12 hr tablet 1,200 mg  1,200 mg Oral BID Raiford Noble Latif, DO   1,200 mg at 01/30/23 [id number]   guaiFENesin-dextromethorphan (ROBITUSSIN DM) 100-10 MG/5ML syrup 10 mL  10 mL Oral Q4H PRN Raiford Noble Latif, DO   10 mL at 01/28/23 1850   ipratropium (ATROVENT) nebulizer solution 0.5 mg  0.5 mg Nebulization BID Raiford Noble Latif, DO   0.5 mg at 01/30/23 0720   ipratropium-albuterol (DUONEB) 0.5-2.5 (3)  MG/3ML nebulizer solution 3 mL  3 mL Nebulization Q6H PRN Tu, Ching T, DO   3 mL at 01/28/23 0032   levalbuterol (XOPENEX) nebulizer solution 0.63 mg  0.63 mg Nebulization BID Raiford Noble Latif, DO   0.63 mg at 01/30/23 0720   methylPREDNISolone sodium succinate (SOLU-MEDROL) 40 mg/mL injection 40 mg  40 mg Intravenous Q12H Raiford Noble [place], DO   40 mg at 01/30/23 0109   Oral care mouth rinse  15 mL Mouth Rinse PRN Raiford Noble Latif, DO       phosphorus (K PHOS NEUTRAL) tablet 500 mg  500 mg Oral BID Sheikh, Omair Latif, DO   500 mg at 01/30/23 [id number]   Outpatient Medications Prior to Visit  Medication Sig Dispense Refill   albuterol (VENTOLIN HFA) 108 (90 Base) MCG/ACT inhaler Inhale 1-2 puffs into the lungs every 6 (six) hours as needed for wheezing or shortness of breath.  (Patient taking differently: Inhale 1 puff into the lungs every 6 (six) hours as needed for wheezing or shortness of breath.) 6.7 g 1   amLODipine (NORVASC) 10 MG tablet Take 10 mg by mouth daily.     busPIRone (BUSPAR) 5 MG tablet Take 1 tablet (5 mg total) by mouth 2 (two) times daily. 60 tablet 0   fluticasone-salmeterol (ADVAIR DISKUS) 250-50 MCG/ACT AEPB Inhale 1 puff into the lungs in the morning and at bedtime. 60 each 3   guaiFENesin (MUCINEX) 600 MG 12 hr tablet Take 1 tablet (600 mg total) by mouth 2 (two) times daily. (Patient not taking: Reported on 12/28/2022)     isosorbide mononitrate (IMDUR) 60 MG 24 hr tablet Take 1 tablet (60 mg total) by mouth daily. 30 tablet 3   losartan (COZAAR) 100 MG tablet Take 1 tablet (100 mg total) by mouth daily. 30 tablet 3   tiotropium (SPIRIVA HANDIHALER) 18 MCG inhalation capsule Place 1 capsule (18 mcg total) into inhaler and inhale daily. (Patient not taking: Reported on 12/20/2022) 30 capsule 2       Objective:   Physical Exam:  General appearance: 62 y.o., male, NAD, conversant  Eyes: anicteric sclerae; PERRL, tracking appropriately HENT: NCAT; MMM Neck: Trachea midline; no lymphadenopathy, no JVD Lungs: CTAB, no crackles, no wheeze, with normal respiratory effort CV: RRR, no murmur  Abdomen: Soft, non-tender; non-distended, BS present  Extremities: No peripheral edema, warm Skin: Normal turgor and texture; no rash Psych: Appropriate affect Neuro: Alert and oriented to person and place, no focal deficit     There were no vitals filed for this visit.   on *** LPM *** RA BMI Readings from Last 3 Encounters:  01/30/23 20.93 kg/m  01/09/23 20.33 kg/m  12/28/22 20.03 kg/m   Wt Readings from Last 3 Encounters:  01/30/23 154 lb 5.2 oz (70 kg)  01/09/23 149 lb 14.6 oz (68 kg)  12/28/22 147 lb 11.3 oz (67 kg)     CBC    Component Value Date/Time   WBC 21.9 (H) 01/30/2023 0425   RBC 5.14 01/30/2023 0425   HGB 14.7  01/30/2023 0425   HCT 43.9 01/30/2023 0425   PLT 324 01/30/2023 0425   MCV 85.4 01/30/2023 0425   MCH 28.6 01/30/2023 0425   MCHC 33.5 01/30/2023 0425   RDW 14.9 01/30/2023 0425   LYMPHSABS 0.7 01/30/2023 0425   MONOABS 0.5 01/30/2023 0425   EOSABS 0.0 01/30/2023 0425   BASOSABS 0.0 01/30/2023 0425   Eos [phone] when not on steroids   Chest Imaging: CXR 01/30/23  unremarkable  CTA Chest 10/2022 with advanced emphysema and bronchial wall thickening  Pulmonary Functions Testing Results:     No data to display            Assessment & Plan:    Plan:      Maryjane Hurter, MD [facility] 01/30/2023 11:39 AM

## 2023-01-30 NOTE — Progress Notes (Signed)
Patient requested and receive 1 [place] sandwich.

## 2023-01-31 ENCOUNTER — Institutional Professional Consult (permissible substitution): Payer: Medicaid Other | Admitting: Student

## 2023-01-31 ENCOUNTER — Inpatient Hospital Stay (HOSPITAL_COMMUNITY): Payer: Medicaid Other

## 2023-01-31 DIAGNOSIS — J441 Chronic obstructive pulmonary disease with (acute) exacerbation: Secondary | ICD-10-CM | POA: Diagnosis not present

## 2023-01-31 LAB — CBC WITH DIFFERENTIAL/PLATELET
Abs Immature Granulocytes: 0.2 10*3/uL — ABNORMAL HIGH (ref 0.00–0.07)
Basophils Absolute: 0 10*3/uL (ref 0.0–0.1)
Basophils Relative: 0 %
Eosinophils Absolute: 0 10*3/uL (ref 0.0–0.5)
Eosinophils Relative: 0 %
HCT: 44.3 % (ref 39.0–52.0)
Hemoglobin: 14.8 g/dL (ref 13.0–17.0)
Immature Granulocytes: 1 %
Lymphocytes Relative: 5 %
Lymphs Abs: 0.8 10*3/uL (ref 0.7–4.0)
MCH: 28.7 pg (ref 26.0–34.0)
MCHC: 33.4 g/dL (ref 30.0–36.0)
MCV: 85.9 fL (ref 80.0–100.0)
Monocytes Absolute: 0.6 10*3/uL (ref 0.1–1.0)
Monocytes Relative: 3 %
Neutro Abs: 16.1 10*3/uL — ABNORMAL HIGH (ref 1.7–7.7)
Neutrophils Relative %: 91 %
Platelets: 302 10*3/uL (ref 150–400)
RBC: 5.16 MIL/uL (ref 4.22–5.81)
RDW: 14.8 % (ref 11.5–15.5)
WBC: 17.7 10*3/uL — ABNORMAL HIGH (ref 4.0–10.5)
nRBC: 0 % (ref 0.0–0.2)

## 2023-01-31 LAB — COMPREHENSIVE METABOLIC PANEL
ALT: 25 U/L (ref 0–44)
AST: 28 U/L (ref 15–41)
Albumin: 3 g/dL — ABNORMAL LOW (ref 3.5–5.0)
Alkaline Phosphatase: 33 U/L — ABNORMAL LOW (ref 38–126)
Anion gap: 8 (ref 5–15)
BUN: 23 mg/dL — ABNORMAL HIGH (ref 6–20)
CO2: 30 mmol/L (ref 22–32)
Calcium: 8.5 mg/dL — ABNORMAL LOW (ref 8.9–10.3)
Chloride: 99 mmol/L (ref 98–111)
Creatinine, Ser: 0.93 mg/dL (ref 0.61–1.24)
GFR, Estimated: >60 mL/min (ref >60)
Glucose, Bld: 151 mg/dL — ABNORMAL HIGH (ref 70–99)
Potassium: 3.7 mmol/L (ref 3.5–5.1)
Sodium: 137 mmol/L (ref 135–145)
Total Bilirubin: 0.6 mg/dL (ref 0.3–1.2)
Total Protein: 5.5 g/dL — ABNORMAL LOW (ref 6.5–8.1)

## 2023-01-31 LAB — MAGNESIUM: Magnesium: 2.2 mg/dL (ref 1.7–2.4)

## 2023-01-31 LAB — PHOSPHORUS: Phosphorus: 2.1 mg/dL — ABNORMAL LOW (ref 2.5–4.6)

## 2023-01-31 MED ORDER — PREDNISONE 5 MG PO TABS
50.0000 mg | ORAL_TABLET | Freq: Every day | ORAL | Status: DC
  Administered 2023-02-01: 50 mg via ORAL
  Filled 2023-01-31: qty 2

## 2023-01-31 NOTE — Progress Notes (Signed)
Mobility Specialist - Progress Note   01/31/23 1045  Mobility  Activity Ambulated independently in hallway  Level of Assistance Independent  Assistive Device None  Distance Ambulated (ft) 700 ft  Activity Response Tolerated well  Mobility Referral Yes  $Mobility charge 1 Mobility   MD requested Mobility Specialist to perform oxygen saturation again due to pt not ambulating far enough. Test includes removing pt from oxygen both at rest and while ambulating.  Below are the results from that testing.     Patient Saturations on Room Air at Rest = spO2 94%  Patient Saturations on Room Air while Ambulating = sp02 88% .  Rested and performed pursed lip breathing for 1 minute with sp02 at 95%.  Patient Saturations on 2 Liters of oxygen while Ambulating = sp02 91%  At end of testing pt left in room on 2  Liters of oxygen.  Reported results to nurse.    Pt received in bed and agreeable to mobility. Pt had some SOB throughout session but stated he was okay. Pt desat to 88% after ambulating ~315f. O2 put back on w/ encouraged pursed lip breaths bringing O2 back up to 95% (~30sec). No complaints during session. Pt to bed after session with all needs met.    Pre-mobility: 76 HR, 94% SpO2 (RA) During mobility: 63 HR, 88% SpO2 (RA) Post-mobility: 87 HR, 91% SPO2 (2L [place])  MSet designer

## 2023-01-31 NOTE — Progress Notes (Signed)
Mobility Specialist - Progress Note   01/31/23 0915  Oxygen Therapy  O2 Device Room Air  Mobility  Activity Ambulated independently in hallway  Level of Assistance Modified independent, requires aide device or extra time  Distance Ambulated (ft) 350 ft  Activity Response Tolerated well  Mobility Referral Yes  $Mobility charge 1 Mobility   Nurse requested Mobility Specialist to perform oxygen saturation test with pt which includes removing pt from oxygen both at rest and while ambulating.  Below are the results from that testing.     Patient Saturations on Room Air at Rest = spO2 92%  Patient Saturations on Room Air while Ambulating = sp02 93% .  Rested and performed pursed lip breathing for 1 minute with sp02 at 93%.  Patient Saturations on 0 Liters of oxygen while Ambulating = sp02 92%  At end of testing pt left in room on 0  Liters of oxygen.  Reported results to nurse.    Pt received in bed and agreeable to mobility. Encouraged pt to slow down while ambulating due to audible SOB. No complaints during session. Pt to bed after session with all needs met.    Pre-mobility: 100 HR, 92% SpO2 (RA) During mobility: 91 HR, 93% SpO2 (RA)   Set designer

## 2023-01-31 NOTE — Progress Notes (Signed)
PROGRESS NOTE    Franklin Chavez  [id number] DOB: 1961-10-17 DOA: 01/27/2023 PCP: [facility]     Brief Narrative:   Franklin Chavez is a 62 y.o. male with medical history significant of HTN, diastolic HF, COPD, GAD, polysubstance abuse (cocaine, THC, tobacco) who presents with increasing shortness of breath.    Subjective:  Improving, thinks he needs home oxygen to prevent coming to the hospital all the time Reports missed his pulmonology appointment due to hospitalization  No wheezing currently, on iv steroids bid, plan to slowly taper down  Assessment & Plan:  Principal Problem:   COPD exacerbation ([place]) Active Problems:   Tobacco abuse   HTN (hypertension)   Leukocytosis   Chronic diastolic CHF (congestive heart failure) (HCC)   Cocaine abuse (HCC)   GAD (generalized anxiety disorder)    Assessment and Plan:   * COPD exacerbation (HCC)/acute hypoxic respiratory failure -likely secondary to continual tobacco and cocaine use, 6th hospitalization for the same since 10/2022 -Chest x-ray no acute infiltrate, reviewed CTA from November 2023, did show advanced emphysema -Respiratory viral panel negative -duoneb q6hr PRN, albuterol q4 PRN -Improving , continue doxycycline ,taper steroid , Wean oxygen, he desired to start on home O2, will do home O2 eval  Chronic diastolic CHF (congestive heart failure) (HCC) -euvolemic   Leukocytosis -likely due to recent steroid use  HTN (hypertension) -reports non-compliance with his medications -BP is elevated to 150/90s -will resume amlodipine for now and monitor  Tobacco abuse -continues to smoke about 2 cigarettes daily  -encouraged cessation  GAD (generalized anxiety disorder) -not currently taking his medication. Previously prescribed Buspar.  Cocaine abuse ([place]) -reports last use yesterday prior to admission  Homelessness; consult transitional care   Nutritional Assessment: The  patient's BMI is: Body mass index is 20.93 kg/m.Marland Kitchen Seen by dietician.  I agree with the assessment and plan as outlined below: Nutrition Status: Nutrition Problem: Moderate Malnutrition Etiology: chronic illness (COPD, substance abuse) Signs/Symptoms: percent weight loss, mild muscle depletion Interventions: Ensure Enlive (each supplement provides 350kcal and 20 grams of protein), MVI  .     I have Reviewed nursing notes, Vitals, pain scores, I/o's, Lab results and  imaging results since pt's last encounter, details please see discussion above  I ordered the following labs:  Unresulted Labs (From admission, onward)     Start     Ordered   02/01/23 [id number]  Basic metabolic panel  Tomorrow morning,   R        01/31/23 1916   02/01/23 0500  Magnesium  Tomorrow morning,   R        01/31/23 1916   02/01/23 0500  Phosphorus  Tomorrow morning,   R        01/31/23 1916   01/27/23 2039  Urine rapid drug screen (hosp performed)  Once,   STAT        01/27/23 2038   01/27/23 2037  CBC with Differential  Once,   STAT        01/27/23 2037             DVT prophylaxis: enoxaparin (LOVENOX) injection 40 mg Start: 01/28/23 1000   Code Status:   Code Status: Full Code  Family Communication: Patient Disposition:   Dispo: The patient is from: Report homeless              Anticipated d/c is to: plan per transitional care  Anticipated d/c date is: 24-48hrs, need to taper steroids  Antimicrobials:   Anti-infectives (From admission, onward)    Start     Dose/Rate Route Frequency Ordered Stop   01/28/23 1230  doxycycline (VIBRA-TABS) tablet 100 mg        100 mg Oral Every 12 hours 01/28/23 1136             Objective: Vitals:   01/31/23 0538 01/31/23 0854 01/31/23 1242 01/31/23 1435  BP: (!) 145/101  (!) 145/89 (!) 145/94  Pulse: 63  69 72  Resp: '20  19 18  '$ Temp: 98.3 F (36.8 C)  98.4 F (36.9 C) 97.8 F (36.6 C)  TempSrc: Oral  Oral Oral  SpO2: 91% 94% 94%  100%  Weight:      Height:        Intake/Output Summary (Last 24 hours) at 01/31/2023 1917 Last data filed at 01/31/2023 1800 Gross per 24 hour  Intake 1080 ml  Output 500 ml  Net 580 ml   Filed Weights   01/27/23 2016 01/28/23 0511 01/30/23 0500  Weight: 68 kg 68 kg 70 kg    Examination:  General exam: alert, awake, communicative,calm, NAD Respiratory system: Clear to auscultation. Respiratory effort normal. Cardiovascular system:  RRR.  Gastrointestinal system: Abdomen is nondistended, soft and nontender.  Normal bowel sounds heard. Central nervous system: Alert and oriented. No focal neurological deficits. Extremities:  no edema Skin: No rashes, lesions or ulcers Psychiatry: Judgement and insight appear normal. Mood & affect appropriate.     Data Reviewed: I have personally reviewed  labs and visualized  imaging studies since the last encounter and formulate the plan        Scheduled Meds:  amLODipine  10 mg Oral Daily   doxycycline  100 mg Oral Q12H   enoxaparin (LOVENOX) injection  40 mg Subcutaneous Q24H   feeding supplement  237 mL Oral BID BM   guaiFENesin  1,200 mg Oral BID   ipratropium  0.5 mg Nebulization BID   levalbuterol  0.63 mg Nebulization BID   multivitamin with minerals  1 tablet Oral Daily   [START ON 02/01/2023] predniSONE  50 mg Oral Q breakfast   Continuous Infusions:   LOS: 3 days   Time spent: 74mns  FFlorencia Reasons MD PhD FACP [facility]  Available via Epic secure chat 7am-7pm for nonurgent issues Please page for urgent issues To page the attending provider between 7A-7P or the covering provider during after hours 7P-7A, please log into the web site [email or web address] and access using universal [place] password for that web site. If you do not have the password, please call the hospital operator.    01/31/2023, 7:17 PM

## 2023-02-01 ENCOUNTER — Other Ambulatory Visit: Payer: Self-pay

## 2023-02-01 ENCOUNTER — Other Ambulatory Visit (HOSPITAL_COMMUNITY): Payer: Self-pay

## 2023-02-01 DIAGNOSIS — J441 Chronic obstructive pulmonary disease with (acute) exacerbation: Secondary | ICD-10-CM | POA: Diagnosis not present

## 2023-02-01 LAB — BASIC METABOLIC PANEL
Anion gap: 6 (ref 5–15)
BUN: 22 mg/dL — ABNORMAL HIGH (ref 6–20)
CO2: 31 mmol/L (ref 22–32)
Calcium: 8.5 mg/dL — ABNORMAL LOW (ref 8.9–10.3)
Chloride: 98 mmol/L (ref 98–111)
Creatinine, Ser: 0.82 mg/dL (ref 0.61–1.24)
GFR, Estimated: >60 mL/min (ref >60)
Glucose, Bld: 97 mg/dL (ref 70–99)
Potassium: 3.8 mmol/L (ref 3.5–5.1)
Sodium: 135 mmol/L (ref 135–145)

## 2023-02-01 LAB — PHOSPHORUS: Phosphorus: 2.8 mg/dL (ref 2.5–4.6)

## 2023-02-01 LAB — MAGNESIUM: Magnesium: 2.7 mg/dL — ABNORMAL HIGH (ref 1.7–2.4)

## 2023-02-01 MED ORDER — PREDNISONE 10 MG PO TABS
ORAL_TABLET | ORAL | 0 refills | Status: AC
  Filled 2023-02-01: qty 20, 8d supply, fill #0

## 2023-02-01 NOTE — TOC Transition Note (Signed)
Transition of Care [facility]) - CM/SW Discharge Note   Patient Details  Name: Franklin Chavez MRN: [id number] Date of Birth: 06/04/1961  [facility] CM/SW Contact:  Lennart Pall, LCSW Phone Number: 02/01/2023, 1:01 PM   Clinical Narrative:     Received referral due to concerns about homelessness and SA.  Met with pt who reports he has "a place to stay. Don't worry." Denies any concerns about SA.  Pt frustrated that he did not receive O2 but have explained that he did not be sats qualifications for home O2.  No further TOC needs.        Patient Goals and CMS Choice      Discharge Placement                         Discharge Plan and Services Additional resources added to the After Visit Summary for                                       Social Determinants of Health (SDOH) Interventions SDOH Screenings   Food Insecurity: Food Insecurity Present (01/28/2023)  Housing: High Risk (01/28/2023)  Transportation Needs: Unmet Transportation Needs (01/28/2023)  Utilities: Not At Risk (01/28/2023)  Tobacco Use: High Risk (01/27/2023)     Readmission Risk Interventions    01/10/2023    3:23 PM 11/01/2022   12:03 PM  Readmission Risk Prevention Plan  Transportation Screening Complete Complete  PCP or Specialist Appt within 5-7 Days  Complete  Home Care Screening  Complete  Medication Review (RN CM)  Complete  Medication Review (RN Care Manager) Referral to Pharmacy   PCP or Specialist appointment within 3-5 days of discharge Complete   HRI or [place] Not Complete   HRI or Home Care Consult Pt Refusal Comments Patient is currently homeless   SW Recovery Care/Counseling Consult Complete   [place] Not Applicable

## 2023-02-01 NOTE — Progress Notes (Signed)
Mobility Specialist - Progress Note   02/01/23 0918  Mobility  Activity Ambulated independently in hallway  Level of Assistance Independent  Assistive Device None  Distance Ambulated (ft) 600 ft  Activity Response Tolerated well  Mobility Referral Yes  $Mobility charge 1 Mobility   Pt received in room standing and agreeable to mobility. Pt had some audible wheezing but stated he felt fine. No complaints during session. Pt to room standing after session with all needs met.    Pre-mobility: 92% SpO2 (RA) During mobility: 93% SpO2 (RA) Post-mobility: 97%SPO2 (RA)  Set designer

## 2023-02-01 NOTE — Discharge Summary (Signed)
Discharge Summary  Franklin Chavez [id number] DOB: 01/23/1961  PCP: [facility]  Admit date: 01/27/2023 Discharge date: 02/01/2023  30 Day Unplanned Readmission Risk Score    Flowsheet Row ED to Hosp-Admission (Current) from 01/27/2023 in [facility]  30 Day Unplanned Readmission Risk Score (%) 49.32 Filed at 02/01/2023 0801       This score is the patient's risk of an unplanned readmission within 30 days of being discharged (0 -100%). The score is based on dignosis, age, lab data, medications, orders, and past utilization.   Low:  0-14.9   Medium: 15-21.9   High: 22-29.9   Extreme: 30 and above         Time spent: 104mns, more than 50% time spent on coordination of care.   Recommendations for Outpatient Follow-up:  [facility]  for hospital discharge follow up, repeat cbc/bmp at follow up F/u with pulmonology for COPD management     Discharge Diagnoses:  Active Hospital Problems   Diagnosis Date Noted   COPD exacerbation ([facility] 10/24/2022   Tobacco abuse 12/13/2018    Priority: 2.   GAD (generalized anxiety disorder) 01/09/2023   Cocaine abuse ([facility] 01/09/2023   Leukocytosis 12/19/2022   Chronic diastolic CHF (congestive heart failure) ([facility] 12/19/2022   HTN (hypertension) 10/23/2022    Resolved Hospital Problems  No resolved problems to display.    Discharge Condition: stable  Diet recommendation: heart healthy  Filed Weights   01/27/23 2016 01/28/23 0511 01/30/23 0500  Weight: 68 kg 68 kg 70 kg    History of present illness: ( per admitting provider Dr TFlossie Buffy  HPI: Franklin SEEMANNis a 62y.o. male with medical history significant of HTN, diastolic HF, COPD, GAD, polysubstance abuse (cocaine, THC, tobacco) who presents with increasing shortness of breath.    He was recently admitted from 2/6-2/8 with acute hypoxic respiratory failure seconary to COPD exacerbation requiring 3L. He was  prescribed prednisone for 3 more days on discharge. Reports return of symptoms once steroid course was completed. Has increase dry cough and increase shortness of breath. Has been using albuterol inhaler at home. Continues to use tobacco but has been cutting down to 2 cigarettes daily. Last used cocaine and THC yesterday.  Does not take any other medications at home including those for his hypertension and heart failure.    In ED, temp of 97.30F, BP of 151/92.    Has leukocytosis of 12.9, hgb of 14.8.    BMP unremarkable. BNP 19. Troponin of 10.    EKG on my review with LVH and elevated J-point to V3.    He was administered '125mg'$  of Solu-medrol and breathing treatments with EMS. Received additional duo-neb in ED. Hospitalist consulted for continual management of COPD exacerbation.   Hospital Course:  Principal Problem:   COPD exacerbation ([facility] Active Problems:   Tobacco abuse   HTN (hypertension)   Leukocytosis   Chronic diastolic CHF (congestive heart failure) (HCC)   Cocaine abuse (HCC)   GAD (generalized anxiety disorder)   Assessment and Plan:   * COPD exacerbation (HCC)/acute hypoxic respiratory failure -likely secondary to continual tobacco and cocaine use, 6th hospitalizations for the same since 10/2022 -Chest x-ray no acute infiltrate, reviewed CTA from November 2023, did show advanced emphysema -Respiratory viral panel negative -duoneb q6hr PRN, albuterol q4 PRN -Improving , received doxycycline ,taper steroid , Wean oxygen, he desired to start on home O2, o2 dropped  to 88% when walking 723f with sob on 2/28 but he improved on 2/29 walked two units lowest o2 sats was 92%, he did not qualify for home o2, plan to discharge with  a slower prednisone taper over 8days -f/u with pcp and pulmonology   Chronic diastolic CHF (congestive heart failure) (HCC) -euvolemic    Leukocytosis -likely due to recent steroid use   HTN (hypertension) -reports non-compliance with his  medications -BP is elevated to 150/90s -resume home bp meds   GAD (generalized anxiety disorder) -not currently taking his medication. Previously prescribed Buspar. -no issue with anxiety in the hospital, f/u with pcp  Tobacco abuse -continues to smoke about 2 cigarettes daily  -encouraged cessation, states is trying to cut down     Cocaine abuse ([facility] -reports last use yesterday prior to admission, advised him that cocaine could cause copd exacerbation as well, cessation education provided , he states will quit cocaine as well     Nutritional Assessment: The patient's BMI is: Body mass index is 20.93 kg/m..Marland KitchenSeen by dietician.  I agree with the assessment and plan as outlined below: Nutrition Status: Nutrition Problem: Moderate Malnutrition Etiology: chronic illness (COPD, substance abuse) Signs/Symptoms: percent weight loss, mild muscle depletion Interventions: Ensure Enlive (each supplement provides 350kcal and 20 grams of protein), MVI   .   Discharge Exam: BP (!) 143/96 (BP Location: Right Arm)   Pulse 69   Temp 98.4 F (36.9 C) (Oral)   Resp 18   Ht 6' (1.829 m)   Wt 70 kg   SpO2 97%   BMI 20.93 kg/m   General: NAD Cardiovascular: RRR, no edema  Respiratory: diminished, no wheezing     Discharge Instructions     Diet general   Complete by: As directed    Increase activity slowly   Complete by: As directed       Allergies as of 02/01/2023   No Known Allergies      Medication List     TAKE these medications    albuterol 108 (90 Base) MCG/ACT inhaler Commonly known as: VENTOLIN HFA Inhale 1-2 puffs into the lungs every 6 (six) hours as needed for wheezing or shortness of breath. What changed: how much to take   amLODipine 10 MG tablet Commonly known as: NORVASC Take 10 mg by mouth daily.   busPIRone 5 MG tablet Commonly known as: BUSPAR Take 1 tablet (5 mg total) by mouth 2 (two) times daily.   fluticasone-salmeterol 250-50 MCG/ACT  Aepb Commonly known as: Advair Diskus Inhale 1 puff into the lungs in the morning and at bedtime.   guaiFENesin 600 MG 12 hr tablet Commonly known as: MUCINEX Take 1 tablet (600 mg total) by mouth 2 (two) times daily.   isosorbide mononitrate 60 MG 24 hr tablet Commonly known as: IMDUR Take 1 tablet (60 mg total) by mouth daily.   losartan 100 MG tablet Commonly known as: COZAAR Take 1 tablet (100 mg total) by mouth daily.   predniSONE 10 MG tablet Commonly known as: DELTASONE Label  & dispense according to the schedule below. 4 Pills PO on day one and day two then, 3 Pills PO on day three and day four, 2 Pills PO on day five and day six, then 1 Pill PO on day seven and day eight,  then STOP.  Total of 20 tabs   tiotropium 18 MCG inhalation capsule Commonly known as: Spiriva HandiHaler Place 1 capsule (18 mcg total) into inhaler and inhale daily.  Durable Medical Equipment  (From admission, onward)           Start     Ordered   02/01/23 1001  For home use only DME oxygen  Once       Question Answer Comment  Length of Need Lifetime   Mode or (Route) Nasal cannula   Liters per Minute 2   Frequency Continuous (stationary and portable oxygen unit needed)   Oxygen conserving device Yes   Oxygen delivery system Gas      02/01/23 1001           No Known Allergies    The results of significant diagnostics from this hospitalization (including imaging, microbiology, ancillary and laboratory) are listed below for reference.    Significant Diagnostic Studies: DG CHEST PORT 1 VIEW  Result Date: 01/31/2023 CLINICAL DATA:  Shortness of breath EXAM: PORTABLE CHEST 1 VIEW COMPARISON:  Chest radiograph dated 01/30/2023 FINDINGS: Normal lung volumes. No focal consolidations. No pleural effusion or pneumothorax. The heart size and mediastinal contours are within normal limits. The visualized skeletal structures are unremarkable. IMPRESSION: No active  disease. Electronically Signed   By: Darrin Nipper M.D.   On: 01/31/2023 08:17   DG CHEST PORT 1 VIEW  Result Date: 01/30/2023 CLINICAL DATA:  Shortness of breath EXAM: PORTABLE CHEST 1 VIEW COMPARISON:  Portable exam 0434 hours compared to 01/29/2023 FINDINGS: Normal heart size, mediastinal contours, and pulmonary vascularity. Minimal bibasilar atelectasis. Lungs otherwise clear. No pulmonary infiltrate, pleural effusion, or pneumothorax. IMPRESSION: Minimal bibasilar atelectasis. Electronically Signed   By: Lavonia Dana M.D.   On: 01/30/2023 08:21   DG CHEST PORT 1 VIEW  Result Date: 01/29/2023 CLINICAL DATA:  Shortness of breath EXAM: PORTABLE CHEST 1 VIEW COMPARISON:  01/27/2023 FINDINGS: Heart size is normal. Mediastinal shadows are normal. Background pattern emphysema, upper lobe predominant. No evidence of infiltrate, collapse or effusion. IMPRESSION: Emphysema. No acute or focal finding. Electronically Signed   By: Nelson Chimes M.D.   On: 01/29/2023 08:01   DG Chest Port 1 View  Result Date: 01/27/2023 CLINICAL DATA:  Shortness of breath, wheezing and COPD. EXAM: PORTABLE CHEST 1 VIEW COMPARISON:  Portable chest 01/09/2023. FINDINGS: The heart size and mediastinal contours are within normal limits. Both lungs are emphysematous but clear. The visualized skeletal structures are unremarkable apart from slight thoracic dextroscoliosis. IMPRESSION: No evidence of acute chest disease.  Stable COPD chest. Electronically Signed   By: Telford Nab M.D.   On: 01/27/2023 21:13   DG Chest Portable 1 View  Result Date: 01/09/2023 CLINICAL DATA:  Productive cough with shortness of breath and wheezing. EXAM: PORTABLE CHEST 1 VIEW COMPARISON:  Radiographs 12/28/2022 and 12/23/2022.  CT 10/28/2022. FINDINGS: 0753 hours. Two views obtained. The heart size and mediastinal contours are stable. The lungs are clear. There is no pleural effusion or pneumothorax. No acute osseous findings are identified. Telemetry leads  overlie the chest. IMPRESSION: Stable chest.  No active cardiopulmonary process. Electronically Signed   By: Richardean Sale M.D.   On: 01/09/2023 08:04    Microbiology: Recent Results (from the past 240 hour(s))  Resp panel by RT-PCR (RSV, Flu A&B, Covid) Anterior Nasal Swab     Status: None   Collection Time: 01/28/23  3:01 PM   Specimen: Anterior Nasal Swab  Result Value Ref Range Status   SARS Coronavirus 2 by RT PCR NEGATIVE NEGATIVE Final    Comment: (NOTE) SARS-CoV-2 target nucleic acids are NOT DETECTED.  The SARS-CoV-2 RNA is  generally detectable in upper respiratory specimens during the acute phase of infection. The lowest concentration of SARS-CoV-2 viral copies this assay can detect is 138 copies/mL. A negative result does not preclude SARS-Cov-2 infection and should not be used as the sole basis for treatment or other patient management decisions. A negative result may occur with  improper specimen collection/handling, submission of specimen other than nasopharyngeal swab, presence of viral mutation(s) within the areas targeted by this assay, and inadequate number of viral copies(<138 copies/mL). A negative result must be combined with clinical observations, patient history, and epidemiological information. The expected result is Negative.  Fact Sheet for Patients:  EntrepreneurPulse.com.au  Fact Sheet for Healthcare Providers:  IncredibleEmployment.be  This test is no t yet approved or cleared by the [place] FDA and  has been authorized for detection and/or diagnosis of SARS-CoV-2 by FDA under an Emergency Use Authorization (EUA). This EUA will remain  in effect (meaning this test can be used) for the duration of the COVID-19 declaration under Section 564(b)(1) of the Act, 21 U.S.C.section 360bbb-3(b)(1), unless the authorization is terminated  or revoked sooner.       Influenza A by PCR NEGATIVE NEGATIVE Final    Influenza B by PCR NEGATIVE NEGATIVE Final    Comment: (NOTE) The Xpert Xpress SARS-CoV-2/FLU/RSV plus assay is intended as an aid in the diagnosis of influenza from Nasopharyngeal swab specimens and should not be used as a sole basis for treatment. Nasal washings and aspirates are unacceptable for Xpert Xpress SARS-CoV-2/FLU/RSV testing.  Fact Sheet for Patients: EntrepreneurPulse.com.au  Fact Sheet for Healthcare Providers: IncredibleEmployment.be  This test is not yet approved or cleared by the [place] FDA and has been authorized for detection and/or diagnosis of SARS-CoV-2 by FDA under an Emergency Use Authorization (EUA). This EUA will remain in effect (meaning this test can be used) for the duration of the COVID-19 declaration under Section 564(b)(1) of the Act, 21 U.S.C. section 360bbb-3(b)(1), unless the authorization is terminated or revoked.     Resp Syncytial Virus by PCR NEGATIVE NEGATIVE Final    Comment: (NOTE) Fact Sheet for Patients: EntrepreneurPulse.com.au  Fact Sheet for Healthcare Providers: IncredibleEmployment.be  This test is not yet approved or cleared by the [place] FDA and has been authorized for detection and/or diagnosis of SARS-CoV-2 by FDA under an Emergency Use Authorization (EUA). This EUA will remain in effect (meaning this test can be used) for the duration of the COVID-19 declaration under Section 564(b)(1) of the Act, 21 U.S.C. section 360bbb-3(b)(1), unless the authorization is terminated or revoked.  Performed at [facility], [place] [address], [place], [place] [address]   Respiratory (~20 pathogens) panel by PCR     Status: None   Collection Time: 01/28/23  3:01 PM   Specimen: Nasopharyngeal Swab; Respiratory  Result Value Ref Range Status   Adenovirus NOT DETECTED NOT DETECTED Final   Coronavirus 229E NOT DETECTED NOT DETECTED Final     Comment: (NOTE) The Coronavirus on the Respiratory Panel, DOES NOT test for the novel  Coronavirus (2019 nCoV)    Coronavirus HKU1 NOT DETECTED NOT DETECTED Final   Coronavirus NL63 NOT DETECTED NOT DETECTED Final   Coronavirus OC43 NOT DETECTED NOT DETECTED Final   Metapneumovirus NOT DETECTED NOT DETECTED Final   Rhinovirus / Enterovirus NOT DETECTED NOT DETECTED Final   Influenza A NOT DETECTED NOT DETECTED Final   Influenza B NOT DETECTED NOT DETECTED Final   Parainfluenza Virus 1 NOT DETECTED NOT DETECTED Final  Parainfluenza Virus 2 NOT DETECTED NOT DETECTED Final   Parainfluenza Virus 3 NOT DETECTED NOT DETECTED Final   Parainfluenza Virus 4 NOT DETECTED NOT DETECTED Final   Respiratory Syncytial Virus NOT DETECTED NOT DETECTED Final   Bordetella pertussis NOT DETECTED NOT DETECTED Final   Bordetella Parapertussis NOT DETECTED NOT DETECTED Final   Chlamydophila pneumoniae NOT DETECTED NOT DETECTED Final   Mycoplasma pneumoniae NOT DETECTED NOT DETECTED Final    Comment: Performed at [facility], [place] [address], [place], [place] [address]     Labs: Basic Metabolic Panel: Recent Labs  Lab 01/28/23 [phone] 01/29/23 0340 01/30/23 0425 01/31/23 0404 02/01/23 0418  NA 138 136 137 137 135  K 4.5 4.8 4.2 3.7 3.8  CL 99 98 99 99 98  CO2 30 29 32 30 31  GLUCOSE 134* 123* 118* 151* 97  BUN <5* 17 19 23* 22*  CREATININE 0.90 0.80 0.89 0.93 0.82  CALCIUM 8.8* 9.0 8.9 8.5* 8.5*  MG 2.2 2.3 2.2 2.2 2.7*  PHOS 4.6 2.8 2.3* 2.1* 2.8   Liver Function Tests: Recent Labs  Lab 01/28/23 0702 01/29/23 0340 01/30/23 0425 01/31/23 0404  AST '23 21 18 28  '$ ALT '13 18 19 25  '$ ALKPHOS 35* 33* 29* 33*  BILITOT 0.7 0.4 0.7 0.6  PROT 5.4* 5.9* 5.3* 5.5*  ALBUMIN 2.7* 3.3* 3.0* 3.0*   No results for input(s): "LIPASE", "AMYLASE" in the last 168 hours. No results for input(s): "AMMONIA" in the last 168 hours. CBC: Recent Labs  Lab 01/27/23 2220 01/28/23 0702 01/29/23 0340  01/30/23 0425 01/31/23 0404  WBC 12.9* 10.1 22.1* 21.9* 17.7*  NEUTROABS 11.1*  --  20.6* 20.5* 16.1*  HGB 14.8 14.9 14.5 14.7 14.8  HCT 44.2 45.0 42.3 43.9 44.3  MCV 86.8 86.4 84.9 85.4 85.9  PLT 264 280 296 324 302   Cardiac Enzymes: No results for input(s): "CKTOTAL", "CKMB", "CKMBINDEX", "TROPONINI" in the last 168 hours. BNP: BNP (last 3 results) Recent Labs    12/20/22 0404 01/09/23 0750 01/27/23 2056  BNP 14.2 29.5 19.5    ProBNP (last 3 results) No results for input(s): "PROBNP" in the last 8760 hours.  CBG: No results for input(s): "GLUCAP" in the last 168 hours.  FURTHER DISCHARGE INSTRUCTIONS:   Get Medicines reviewed and adjusted: Please take all your medications with you for your next visit with your Primary MD   Laboratory/radiological data: Please request your Primary MD to go over all hospital tests and procedure/radiological results at the follow up, please ask your Primary MD to get all Hospital records sent to his/her office.   In some cases, they will be blood work, cultures and biopsy results pending at the time of your discharge. Please request that your primary care M.D. goes through all the records of your hospital data and follows up on these results.   Also Note the following: If you experience worsening of your admission symptoms, develop shortness of breath, life threatening emergency, suicidal or homicidal thoughts you must seek medical attention immediately by calling 911 or calling your MD immediately  if symptoms less severe.   You must read complete instructions/literature along with all the possible adverse reactions/side effects for all the Medicines you take and that have been prescribed to you. Take any new Medicines after you have completely understood and accpet all the possible adverse reactions/side effects.    Do not drive when taking Pain medications or sleeping medications (Benzodaizepines)   Do not take more  than prescribed  Pain, Sleep and Anxiety Medications. It is not advisable to combine anxiety,sleep and pain medications without talking with your primary care practitioner   Special Instructions: If you have smoked or chewed Tobacco  in the last 2 yrs please stop smoking, stop any regular Alcohol  and or any Recreational drug use.   Wear Seat belts while driving.   Please note: You were cared for by a hospitalist during your hospital stay. Once you are discharged, your primary care physician will handle any further medical issues. Please note that NO REFILLS for any discharge medications will be authorized once you are discharged, as it is imperative that you return to your primary care physician (or establish a relationship with a primary care physician if you do not have one) for your post hospital discharge needs so that they can reassess your need for medications and monitor your lab values.     Signed:  Florencia Reasons MD, PhD, FACP  [facility] 02/01/2023, 10:28 AM

## 2023-02-01 NOTE — Progress Notes (Addendum)
Mobility Specialist - Progress Note   02/01/23 1049  Oxygen Therapy  O2 Device Room Air  Mobility  Activity Ambulated independently in hallway  Level of Assistance Independent  Assistive Device None  Distance Ambulated (ft) 850 ft  Activity Response Tolerated well  Mobility Referral Yes  $Mobility charge 1 Mobility   MD requested Mobility Specialist to perform oxygen saturation test with pt which includes removing pt from oxygen both at rest and while ambulating.  Below are the results from that testing.     Patient Saturations on Room Air at Rest = spO2 94%  Patient Saturations on Room Air while Ambulating = sp02 95% .  Rested and performed pursed lip breathing for 1 minute with sp02 at 95%.  Patient Saturations on 0 Liters of oxygen while Ambulating = sp02 90%  At end of testing pt left in room on 0  Liters of oxygen.  Reported results to nurse.    Pt received in bed and agreeable to mobility. Pt had some audible SOB throughout session, but no complaints. Pt to recliner after session with all needs met.    Pre-mobility: 94% SpO2 (RA) During mobility: 95% SpO2 (RA) Post-mobility: 90% SPO2 (RA)  Set designer

## 2023-02-01 NOTE — Plan of Care (Signed)
  Problem: Education: Goal: Knowledge of the prescribed therapeutic regimen will improve Outcome: Progressing   Problem: Respiratory: Goal: Ability to maintain adequate ventilation will improve Outcome: Progressing   Problem: Education: Goal: Knowledge of General Education information will improve Description: Including pain rating scale, medication(s)/side effects and non-pharmacologic comfort measures Outcome: Progressing

## 2023-02-01 NOTE — Progress Notes (Signed)
Patient discharge barrier: home O2 evaluation.     Update: Completed and patient aware that he doesn't qualify for home O2. Patient agrees and is denying SOB or any difficulty.  Patient calling ride and awaiting medicaton delivery from [employer].

## 2023-02-19 ENCOUNTER — Emergency Department (HOSPITAL_COMMUNITY)
Admission: EM | Admit: 2023-02-19 | Discharge: 2023-02-19 | Disposition: A | Discharge status: Home / Self Care | Payer: Medicaid Other | Attending: Emergency Medicine | Admitting: Emergency Medicine

## 2023-02-19 ENCOUNTER — Emergency Department (HOSPITAL_COMMUNITY): Payer: Medicaid Other

## 2023-02-19 ENCOUNTER — Other Ambulatory Visit: Payer: Self-pay

## 2023-02-19 ENCOUNTER — Encounter (HOSPITAL_COMMUNITY): Payer: Self-pay

## 2023-02-19 DIAGNOSIS — E876 Hypokalemia: Secondary | ICD-10-CM | POA: Diagnosis not present

## 2023-02-19 DIAGNOSIS — Z1152 Encounter for screening for COVID-19: Secondary | ICD-10-CM | POA: Insufficient documentation

## 2023-02-19 DIAGNOSIS — Z79899 Other long term (current) drug therapy: Secondary | ICD-10-CM | POA: Diagnosis not present

## 2023-02-19 DIAGNOSIS — I10 Essential (primary) hypertension: Secondary | ICD-10-CM | POA: Diagnosis not present

## 2023-02-19 DIAGNOSIS — F17200 Nicotine dependence, unspecified, uncomplicated: Secondary | ICD-10-CM | POA: Insufficient documentation

## 2023-02-19 DIAGNOSIS — R0602 Shortness of breath: Secondary | ICD-10-CM | POA: Diagnosis present

## 2023-02-19 DIAGNOSIS — J441 Chronic obstructive pulmonary disease with (acute) exacerbation: Secondary | ICD-10-CM | POA: Insufficient documentation

## 2023-02-19 DIAGNOSIS — Z7951 Long term (current) use of inhaled steroids: Secondary | ICD-10-CM | POA: Diagnosis not present

## 2023-02-19 LAB — RESP PANEL BY RT-PCR (RSV, FLU A&B, COVID)  RVPGX2
Influenza A by PCR: NEGATIVE
Influenza B by PCR: NEGATIVE
Resp Syncytial Virus by PCR: NEGATIVE
SARS Coronavirus 2 by RT PCR: NEGATIVE

## 2023-02-19 LAB — CBC WITH DIFFERENTIAL/PLATELET
Abs Immature Granulocytes: 0.05 10*3/uL (ref 0.00–0.07)
Basophils Absolute: 0 10*3/uL (ref 0.0–0.1)
Basophils Relative: 0 %
Eosinophils Absolute: 0.3 10*3/uL (ref 0.0–0.5)
Eosinophils Relative: 3 %
HCT: 43.4 % (ref 39.0–52.0)
Hemoglobin: 14.8 g/dL (ref 13.0–17.0)
Immature Granulocytes: 1 %
Lymphocytes Relative: 6 %
Lymphs Abs: 0.6 10*3/uL — ABNORMAL LOW (ref 0.7–4.0)
MCH: 29.1 pg (ref 26.0–34.0)
MCHC: 34.1 g/dL (ref 30.0–36.0)
MCV: 85.4 fL (ref 80.0–100.0)
Monocytes Absolute: 0.2 10*3/uL (ref 0.1–1.0)
Monocytes Relative: 2 %
Neutro Abs: 8.7 10*3/uL — ABNORMAL HIGH (ref 1.7–7.7)
Neutrophils Relative %: 88 %
Platelets: 285 10*3/uL (ref 150–400)
RBC: 5.08 MIL/uL (ref 4.22–5.81)
RDW: 15.2 % (ref 11.5–15.5)
WBC: 9.9 10*3/uL (ref 4.0–10.5)
nRBC: 0 % (ref 0.0–0.2)

## 2023-02-19 LAB — BLOOD GAS, VENOUS
Acid-Base Excess: 4.7 mmol/L — ABNORMAL HIGH (ref 0.0–2.0)
Bicarbonate: 30.9 mmol/L — ABNORMAL HIGH (ref 20.0–28.0)
O2 Saturation: 94.2 %
Patient temperature: 37
pCO2, Ven: 51 mmHg (ref 44–60)
pH, Ven: 7.39 (ref 7.25–7.43)
pO2, Ven: 64 mmHg — ABNORMAL HIGH (ref 32–45)

## 2023-02-19 LAB — BASIC METABOLIC PANEL
Anion gap: 7 (ref 5–15)
BUN: 8 mg/dL (ref 6–20)
CO2: 26 mmol/L (ref 22–32)
Calcium: 8.3 mg/dL — ABNORMAL LOW (ref 8.9–10.3)
Chloride: 102 mmol/L (ref 98–111)
Creatinine, Ser: 0.83 mg/dL (ref 0.61–1.24)
GFR, Estimated: >60 mL/min (ref >60)
Glucose, Bld: 109 mg/dL — ABNORMAL HIGH (ref 70–99)
Potassium: 3.4 mmol/L — ABNORMAL LOW (ref 3.5–5.1)
Sodium: 135 mmol/L (ref 135–145)

## 2023-02-19 LAB — BRAIN NATRIURETIC PEPTIDE: B Natriuretic Peptide: 14.9 pg/mL (ref 0.0–100.0)

## 2023-02-19 LAB — TROPONIN I (HIGH SENSITIVITY): Troponin I (High Sensitivity): 10 ng/L (ref <18)

## 2023-02-19 MED ORDER — PREDNISONE 20 MG PO TABS: 40.0000 mg | ORAL_TABLET | Freq: Every day | ORAL | 0 refills | Status: AC

## 2023-02-19 MED ORDER — ALBUTEROL SULFATE HFA 108 (90 BASE) MCG/ACT IN AERS
2.0000 | INHALATION_SPRAY | Freq: Once | RESPIRATORY_TRACT | Status: AC
  Administered 2023-02-19: 2 via RESPIRATORY_TRACT
  Filled 2023-02-19: qty 6.7

## 2023-02-19 MED ORDER — POTASSIUM CHLORIDE CRYS ER 20 MEQ PO TBCR
40.0000 meq | EXTENDED_RELEASE_TABLET | Freq: Once | ORAL | Status: AC
  Administered 2023-02-19: 40 meq via ORAL
  Filled 2023-02-19: qty 2

## 2023-02-19 MED ORDER — DOXYCYCLINE HYCLATE 100 MG PO CAPS: 100.0000 mg | ORAL_CAPSULE | Freq: Two times a day (BID) | ORAL | 0 refills | Status: AC

## 2023-02-19 MED ORDER — IPRATROPIUM-ALBUTEROL 0.5-2.5 (3) MG/3ML IN SOLN
6.0000 mL | Freq: Once | RESPIRATORY_TRACT | Status: AC
  Administered 2023-02-19: 6 mL via RESPIRATORY_TRACT
  Filled 2023-02-19: qty 3

## 2023-02-19 MED ORDER — AMLODIPINE BESYLATE 5 MG PO TABS: 10.0000 mg | ORAL_TABLET | Freq: Every day | ORAL | 1 refills | Status: DC

## 2023-02-19 MED ORDER — DOXYCYCLINE HYCLATE 100 MG PO TABS
100.0000 mg | ORAL_TABLET | Freq: Once | ORAL | Status: AC
  Administered 2023-02-19: 100 mg via ORAL
  Filled 2023-02-19: qty 1

## 2023-02-19 NOTE — Discharge Instructions (Addendum)
You were seen in the emergency department today for a COPD (attack)exacerbation. You were given breathing treatments and your first dose of steroids here. You were prescribed additional steroids (prednisone) to take during the following 4 days to help your symptoms improve. Please take this as directed.  Continue to use your albuterol every 4-6 hours as needed and continue to take any other asthma medication that you use.   Please make an appointment with your primary care doctor within one week to assure improvement or resolution in symptoms.  COPD attacks can get worse after it gets better, sometimes suddenly. If your breathing worsens and does not improve with your inhaler, or you need to use your inhaler more than 2 times every 4 hours, you should return to the emergency department.

## 2023-02-19 NOTE — ED Provider Notes (Signed)
[place] EMERGENCY DEPARTMENT AT [facility] Provider Note   CSN: [id number] Arrival date & time: 02/19/23  2058     History  Chief Complaint  Patient presents with   Shortness of Breath    Pt bib ems with SOB, 91% RA, hx COPD. Diminished in lower lobes and wheezing present. Duoneb, 125 solumedrol and 2g mag admistered via ems.     Franklin Chavez is a 62 y.o. male.  With PMH of COPD not on oxygen at baseline, HTN, tobacco use disorder who presents with wheezing and shortness of breath.  EMS administered a DuoNeb, 125 mg IV Solu-Medrol, 2 g magnesium.  Patient feels like whenever he does not take his medications he will have a COPD exacerbation.  He says he did not take any of his medicines today and then was having increased wheezing and dry cough so he called EMS.  He has been feeling better since getting medications.  Denies any fevers, productive cough, chest pain, vomiting, diarrhea.  No leg pain or swelling.  Denies any history of blood clots.  He is still smoking daily.   Shortness of Breath      Home Medications Prior to Admission medications   Medication Sig Start Date End Date Taking? Authorizing Provider  amLODipine (NORVASC) 5 MG tablet Take 2 tablets (10 mg total) by mouth daily. 02/19/23 04/20/23 Yes Elgie Congo, MD  doxycycline (VIBRAMYCIN) 100 MG capsule Take 1 capsule (100 mg total) by mouth 2 (two) times daily for 7 days. 02/19/23 02/26/23 Yes Elgie Congo, MD  predniSONE (DELTASONE) 20 MG tablet Take 2 tablets (40 mg total) by mouth daily for 4 days. 02/19/23 02/23/23 Yes Elgie Congo, MD  albuterol (VENTOLIN HFA) 108 (90 Base) MCG/ACT inhaler Inhale 1-2 puffs into the lungs every 6 (six) hours as needed for wheezing or shortness of breath. Patient taking differently: Inhale 1 puff into the lungs every 6 (six) hours as needed for wheezing or shortness of breath. 11/07/22   Rai, Ripudeep K, MD  amLODipine (NORVASC) 10 MG tablet Take 10 mg  by mouth daily.    [provider]  busPIRone (BUSPAR) 5 MG tablet Take 1 tablet (5 mg total) by mouth 2 (two) times daily. 11/07/22   Rai, Ripudeep K, MD  fluticasone-salmeterol (ADVAIR DISKUS) 250-50 MCG/ACT AEPB Inhale 1 puff into the lungs in the morning and at bedtime. 11/07/22   Rai, Vernelle Emerald, MD  guaiFENesin (MUCINEX) 600 MG 12 hr tablet Take 1 tablet (600 mg total) by mouth 2 (two) times daily. Patient not taking: Reported on 12/28/2022 12/22/22   Florencia Reasons, MD  isosorbide mononitrate (IMDUR) 60 MG 24 hr tablet Take 1 tablet (60 mg total) by mouth daily. 12/22/22   Florencia Reasons, MD  losartan (COZAAR) 100 MG tablet Take 1 tablet (100 mg total) by mouth daily. 12/22/22   Florencia Reasons, MD  tiotropium (SPIRIVA HANDIHALER) 18 MCG inhalation capsule Place 1 capsule (18 mcg total) into inhaler and inhale daily. Patient not taking: Reported on 12/20/2022 11/07/22 02/05/23  Mendel Corning, MD      Allergies    Patient has no known allergies.    Review of Systems   Review of Systems  Respiratory:  Positive for shortness of breath.     Physical Exam Updated Vital Signs BP (!) 165/91   Pulse 89   Temp 97.8 F (36.6 C) (Oral)   Resp (!) 23   Ht 6' (1.829 m)   Wt 77.1  kg   SpO2 94%   BMI 23.06 kg/m  Physical Exam Constitutional: Alert and oriented. Chronically ill appearing but NAD, nontoxic Eyes: Conjunctivae are normal. ENT      Head: Normocephalic and atraumatic.      Nose: No congestion.      Mouth/Throat: Mucous membranes are moist.      Neck: No stridor. Cardiovascular: S1, S2,  Normal and symmetric distal pulses are present in all extremities.Warm and well perfused. Respiratory: Mildly tachypneic.  No accessory muscle use.  No nasal flaring.  O2 sat 94-95 on RA.  Decreased breath sounds no significant wheezing or rhonchi or rails. Gastrointestinal: Soft and nontender. There is no CVA tenderness. Musculoskeletal: Normal range of motion in all extremities.      Right lower leg: No  tenderness or edema.      Left lower leg: No tenderness or edema. Neurologic: Normal speech and language. No gross focal neurologic deficits are appreciated. Skin: Skin is warm, dry and intact. No rash noted. Psychiatric: Mood and affect are normal. Speech and behavior are normal.  ED Results / Procedures / Treatments   Labs (all labs ordered are listed, but only abnormal results are displayed) Labs Reviewed  BASIC METABOLIC PANEL - Abnormal; Notable for the following components:      Result Value   Potassium 3.4 (*)    Glucose, Bld 109 (*)    Calcium 8.3 (*)    All other components within normal limits  CBC WITH DIFFERENTIAL/PLATELET - Abnormal; Notable for the following components:   Neutro Abs 8.7 (*)    Lymphs Abs 0.6 (*)    All other components within normal limits  BLOOD GAS, VENOUS - Abnormal; Notable for the following components:   pO2, Ven 64 (*)    Bicarbonate 30.9 (*)    Acid-Base Excess 4.7 (*)    All other components within normal limits  RESP PANEL BY RT-PCR (RSV, FLU A&B, COVID)  RVPGX2  BRAIN NATRIURETIC PEPTIDE  TROPONIN I (HIGH SENSITIVITY)    EKG EKG Interpretation  Date/Time:  Monday February 19 2023 21:16:40 EDT Ventricular Rate:  69 PR Interval:  148 QRS Duration: 94 QT Interval:  424 QTC Calculation: 455 R Axis:   96 Text Interpretation: Sinus rhythm Right axis deviation  Minimal ST elevation anterior leads unchanged from prior. T wave inversion avL unchanged from prior Confirmed by Georgina Snell [phone]) on 02/19/2023 9:38:46 PM  Radiology DG Chest Portable 1 View  Result Date: 02/19/2023 CLINICAL DATA:  Shortness of breath.  History of COPD. EXAM: PORTABLE CHEST 1 VIEW COMPARISON:  CT examination dated October 28, 2022 FINDINGS: The heart size and mediastinal contours are within normal limits. Hyperinflated lungs with emphysematous changes of the upper lobes. No focal consolidation or pleural effusion. The visualized skeletal structures are  unremarkable. IMPRESSION: Hyperinflated lungs with emphysematous changes of the upper lobes. No acute cardiopulmonary process. Electronically Signed   By: Keane Police D.O.   On: 02/19/2023 21:48    Procedures Procedures    Medications Ordered in ED Medications  ipratropium-albuterol (DUONEB) 0.5-2.5 (3) MG/3ML nebulizer solution 6 mL (6 mLs Nebulization Given 02/19/23 2149)  doxycycline (VIBRA-TABS) tablet 100 mg (100 mg Oral Given 02/19/23 2151)  albuterol (VENTOLIN HFA) 108 (90 Base) MCG/ACT inhaler 2 puff (2 puffs Inhalation Given 02/19/23 2215)  potassium chloride SA (KLOR-CON M) CR tablet 40 mEq (40 mEq Oral Given 02/19/23 2241)    ED Course/ Medical Decision Making/ A&P  Medical Decision Making QUAY MARSACK is a 62 y.o. male.  With PMH of COPD not on oxygen at baseline, HTN, tobacco use disorder who presents with wheezing and shortness of breath.  EMS administered a DuoNeb, 125 mg IV Solu-Medrol, 2 g magnesium.   Patient has symptoms suggestive of COPD exacerbation.  Consider secondary to noncompliance, continued smoking, viral URI.  Chest x-ray obtained which I personally reviewed no pneumonia.  Mild hypokalemia 3.4 repleted here.  EKG unremarkable and unchanged from prior with reassuring high sensitive troponin.  Doubt ACS.  VBG with no hypercarbia.  Patient reassessed was doing much better after medications.  Satting 94-95 on room air at rest.  He was ambulated had a brief desat to 87 however recovered immediately upon resting.  Offered patient admission which he declined at this time.  Discharging patient with continued steroid course, doxycycline, home amlodipine and advised close follow-up with PCP or strict return precautions.  Patient agreement with plan.  Discharged in stable condition.  Amount and/or Complexity of Data Reviewed Labs: ordered. Radiology: ordered.  Risk Prescription drug management.    Final Clinical Impression(s) / ED  Diagnoses Final diagnoses:  COPD exacerbation ([place])  Hypokalemia    Rx / DC Orders ED Discharge Orders          Ordered    predniSONE (DELTASONE) 20 MG tablet  Daily        02/19/23 2322    amLODipine (NORVASC) 5 MG tablet  Daily        02/19/23 2322    doxycycline (VIBRAMYCIN) 100 MG capsule  2 times daily        02/19/23 2350              Elgie Congo, MD 02/19/23 2351

## 2023-02-23 ENCOUNTER — Other Ambulatory Visit: Payer: Self-pay

## 2023-02-23 ENCOUNTER — Encounter (HOSPITAL_COMMUNITY): Payer: Self-pay

## 2023-02-23 ENCOUNTER — Emergency Department (HOSPITAL_COMMUNITY): Payer: Medicaid Other

## 2023-02-23 ENCOUNTER — Emergency Department (HOSPITAL_COMMUNITY)
Admission: EM | Admit: 2023-02-23 | Discharge: 2023-02-23 | Disposition: A | Discharge status: Home / Self Care | Payer: Medicaid Other | Attending: Emergency Medicine | Admitting: Emergency Medicine

## 2023-02-23 DIAGNOSIS — R0602 Shortness of breath: Secondary | ICD-10-CM | POA: Diagnosis present

## 2023-02-23 DIAGNOSIS — R000 Tachycardia, unspecified: Secondary | ICD-10-CM | POA: Diagnosis not present

## 2023-02-23 DIAGNOSIS — Z7951 Long term (current) use of inhaled steroids: Secondary | ICD-10-CM | POA: Insufficient documentation

## 2023-02-23 DIAGNOSIS — Z87891 Personal history of nicotine dependence: Secondary | ICD-10-CM | POA: Insufficient documentation

## 2023-02-23 DIAGNOSIS — J441 Chronic obstructive pulmonary disease with (acute) exacerbation: Secondary | ICD-10-CM | POA: Insufficient documentation

## 2023-02-23 DIAGNOSIS — Z1152 Encounter for screening for COVID-19: Secondary | ICD-10-CM | POA: Diagnosis not present

## 2023-02-23 DIAGNOSIS — Z79899 Other long term (current) drug therapy: Secondary | ICD-10-CM | POA: Insufficient documentation

## 2023-02-23 LAB — CBC WITH DIFFERENTIAL/PLATELET
Abs Immature Granulocytes: 0.05 10*3/uL (ref 0.00–0.07)
Basophils Absolute: 0 10*3/uL (ref 0.0–0.1)
Basophils Relative: 0 %
Eosinophils Absolute: 1.2 10*3/uL — ABNORMAL HIGH (ref 0.0–0.5)
Eosinophils Relative: 9 %
HCT: 44.7 % (ref 39.0–52.0)
Hemoglobin: 14.7 g/dL (ref 13.0–17.0)
Immature Granulocytes: 0 %
Lymphocytes Relative: 17 %
Lymphs Abs: 2.3 10*3/uL (ref 0.7–4.0)
MCH: 28.5 pg (ref 26.0–34.0)
MCHC: 32.9 g/dL (ref 30.0–36.0)
MCV: 86.8 fL (ref 80.0–100.0)
Monocytes Absolute: 1.1 10*3/uL — ABNORMAL HIGH (ref 0.1–1.0)
Monocytes Relative: 9 %
Neutro Abs: 8.6 10*3/uL — ABNORMAL HIGH (ref 1.7–7.7)
Neutrophils Relative %: 65 %
Platelets: 279 10*3/uL (ref 150–400)
RBC: 5.15 MIL/uL (ref 4.22–5.81)
RDW: 15.1 % (ref 11.5–15.5)
WBC: 13.3 10*3/uL — ABNORMAL HIGH (ref 4.0–10.5)
nRBC: 0 % (ref 0.0–0.2)

## 2023-02-23 LAB — COMPREHENSIVE METABOLIC PANEL
ALT: 21 U/L (ref 0–44)
AST: 21 U/L (ref 15–41)
Albumin: 3.1 g/dL — ABNORMAL LOW (ref 3.5–5.0)
Alkaline Phosphatase: 31 U/L — ABNORMAL LOW (ref 38–126)
Anion gap: 7 (ref 5–15)
BUN: 10 mg/dL (ref 6–20)
CO2: 28 mmol/L (ref 22–32)
Calcium: 7.9 mg/dL — ABNORMAL LOW (ref 8.9–10.3)
Chloride: 101 mmol/L (ref 98–111)
Creatinine, Ser: 0.86 mg/dL (ref 0.61–1.24)
GFR, Estimated: >60 mL/min (ref >60)
Glucose, Bld: 135 mg/dL — ABNORMAL HIGH (ref 70–99)
Potassium: 3.1 mmol/L — ABNORMAL LOW (ref 3.5–5.1)
Sodium: 136 mmol/L (ref 135–145)
Total Bilirubin: 0.5 mg/dL (ref 0.3–1.2)
Total Protein: 5.3 g/dL — ABNORMAL LOW (ref 6.5–8.1)

## 2023-02-23 LAB — RESP PANEL BY RT-PCR (RSV, FLU A&B, COVID)  RVPGX2
Influenza A by PCR: NEGATIVE
Influenza B by PCR: NEGATIVE
Resp Syncytial Virus by PCR: NEGATIVE
SARS Coronavirus 2 by RT PCR: NEGATIVE

## 2023-02-23 MED ORDER — ALBUTEROL SULFATE (2.5 MG/3ML) 0.083% IN NEBU
10.0000 mg/h | INHALATION_SOLUTION | Freq: Once | RESPIRATORY_TRACT | Status: AC
  Administered 2023-02-23: 10 mg/h via RESPIRATORY_TRACT
  Filled 2023-02-23: qty 6

## 2023-02-23 MED ORDER — PREDNISONE 20 MG PO TABS: 40.0000 mg | ORAL_TABLET | Freq: Every day | ORAL | 0 refills | Status: DC

## 2023-02-23 MED ORDER — METHYLPREDNISOLONE SODIUM SUCC 125 MG IJ SOLR
125.0000 mg | Freq: Once | INTRAMUSCULAR | Status: AC
  Administered 2023-02-23: 125 mg via INTRAVENOUS
  Filled 2023-02-23: qty 2

## 2023-02-23 MED ORDER — IPRATROPIUM-ALBUTEROL 0.5-2.5 (3) MG/3ML IN SOLN
3.0000 mL | Freq: Once | RESPIRATORY_TRACT | Status: AC
  Administered 2023-02-23: 3 mL via RESPIRATORY_TRACT
  Filled 2023-02-23: qty 3

## 2023-02-23 NOTE — ED Triage Notes (Signed)
Pt coming from home by EMS. Called EMS stating he felt SOB all night. EMS noted diminished lung sounds in all fields. 2x Duonebs administered by EMS.

## 2023-02-23 NOTE — ED Provider Notes (Signed)
[place] EMERGENCY DEPARTMENT AT [facility] Provider Note   CSN: [id number] Arrival date & time: 02/23/23  [id number]     History  Chief Complaint  Patient presents with   Shortness of Breath    Franklin Chavez is a 62 y.o. male.  HPI Patient presents in respiratory distress via EMS.  Patient is a smoker, has a history of COPD.  EMS reports that the patient complained of difficulty breathing for the past day.  On arrival patient was tachypneic, tachycardic, but not hypoxic.  He received 2 albuterol treatments en route, without appreciable change. Patient denies pain, acknowledges dyspnea, feeling generally poorly.    Home Medications Prior to Admission medications   Medication Sig Start Date End Date Taking? Authorizing Provider  predniSONE (DELTASONE) 20 MG tablet Take 2 tablets (40 mg total) by mouth daily with breakfast. For the next four days 02/23/23  Yes Carmin Muskrat, MD  albuterol (VENTOLIN HFA) 108 (90 Base) MCG/ACT inhaler Inhale 1-2 puffs into the lungs every 6 (six) hours as needed for wheezing or shortness of breath. Patient taking differently: Inhale 1 puff into the lungs every 6 (six) hours as needed for wheezing or shortness of breath. 11/07/22   Rai, Ripudeep K, MD  amLODipine (NORVASC) 10 MG tablet Take 10 mg by mouth daily.    [provider]  amLODipine (NORVASC) 5 MG tablet Take 2 tablets (10 mg total) by mouth daily. 02/19/23 04/20/23  Elgie Congo, MD  busPIRone (BUSPAR) 5 MG tablet Take 1 tablet (5 mg total) by mouth 2 (two) times daily. 11/07/22   Rai, Vernelle Emerald, MD  doxycycline (VIBRAMYCIN) 100 MG capsule Take 1 capsule (100 mg total) by mouth 2 (two) times daily for 7 days. 02/19/23 02/26/23  Elgie Congo, MD  fluticasone-salmeterol (ADVAIR DISKUS) 250-50 MCG/ACT AEPB Inhale 1 puff into the lungs in the morning and at bedtime. 11/07/22   Rai, Vernelle Emerald, MD  guaiFENesin (MUCINEX) 600 MG 12 hr tablet Take 1 tablet (600 mg total) by  mouth 2 (two) times daily. Patient not taking: Reported on 12/28/2022 12/22/22   Florencia Reasons, MD  isosorbide mononitrate (IMDUR) 60 MG 24 hr tablet Take 1 tablet (60 mg total) by mouth daily. 12/22/22   Florencia Reasons, MD  losartan (COZAAR) 100 MG tablet Take 1 tablet (100 mg total) by mouth daily. 12/22/22   Florencia Reasons, MD  predniSONE (DELTASONE) 20 MG tablet Take 2 tablets (40 mg total) by mouth daily for 4 days. 02/19/23 02/23/23  Elgie Congo, MD  tiotropium (SPIRIVA HANDIHALER) 18 MCG inhalation capsule Place 1 capsule (18 mcg total) into inhaler and inhale daily. Patient not taking: Reported on 12/20/2022 11/07/22 02/05/23  Mendel Corning, MD      Allergies    Patient has no known allergies.    Review of Systems   Review of Systems  Unable to perform ROS: Acuity of condition    Physical Exam Updated Vital Signs BP 113/71   Pulse 67   Temp 97.8 F (36.6 C) (Oral)   Resp 15   Ht 6' (1.829 m)   Wt 77.1 kg   SpO2 97%   BMI 23.06 kg/m  Physical Exam Vitals and nursing note reviewed.  Constitutional:      General: He is in acute distress.     Appearance: He is well-developed. He is ill-appearing and diaphoretic.  HENT:     Head: Normocephalic and atraumatic.  Eyes:     Conjunctiva/sclera: Conjunctivae normal.  Cardiovascular:     Rate and Rhythm: Regular rhythm. Tachycardia present.  Pulmonary:     Effort: Tachypnea, accessory muscle usage and respiratory distress present.     Breath sounds: No stridor. Decreased breath sounds and wheezing present.  Abdominal:     General: There is no distension.  Skin:    General: Skin is warm.  Neurological:     Mental Status: He is alert and oriented to person, place, and time.     ED Results / Procedures / Treatments   Labs (all labs ordered are listed, but only abnormal results are displayed) Labs Reviewed  COMPREHENSIVE METABOLIC PANEL - Abnormal; Notable for the following components:      Result Value   Potassium 3.1 (*)     Glucose, Bld 135 (*)    Calcium 7.9 (*)    Total Protein 5.3 (*)    Albumin 3.1 (*)    Alkaline Phosphatase 31 (*)    All other components within normal limits  CBC WITH DIFFERENTIAL/PLATELET - Abnormal; Notable for the following components:   WBC 13.3 (*)    Neutro Abs 8.6 (*)    Monocytes Absolute 1.1 (*)    Eosinophils Absolute 1.2 (*)    All other components within normal limits  RESP PANEL BY RT-PCR (RSV, FLU A&B, COVID)  RVPGX2    EKG EKG Interpretation  Date/Time:  Friday February 23 2023 07:49:06 EDT Ventricular Rate:  81 PR Interval:  153 QRS Duration: 100 QT Interval:  426 QTC Calculation: 495 R Axis:   95 Text Interpretation: Sinus rhythm ST-t wave abnormality Consider left ventricular hypertrophy Artifact Abnormal ECG Confirmed by Carmin Muskrat [phone]) on 02/23/2023 8:11:07 AM  Radiology DG Chest Port 1 View  Result Date: 02/23/2023 CLINICAL DATA:  Shortness of breath, history COPD, smoking, hypertension EXAM: PORTABLE CHEST 1 VIEW COMPARISON:  Portable exam 0731 hours compared to 02/19/2023 FINDINGS: Normal heart size, mediastinal contours, and pulmonary vascularity. Lungs hyperinflated but clear. No acute infiltrate, pleural effusion, or pneumothorax. Skin fold projects over LEFT upper lobe. Osseous structures unremarkable. IMPRESSION: Hyperinflated lungs without acute infiltrate. Electronically Signed   By: Lavonia Dana M.D.   On: 02/23/2023 08:31    Procedures Procedures    Medications Ordered in ED Medications  methylPREDNISolone sodium succinate (SOLU-MEDROL) 125 mg/2 mL injection 125 mg (125 mg Intravenous Given 02/23/23 0744)  albuterol (PROVENTIL) (2.5 MG/3ML) 0.083% nebulizer solution (10 mg/hr Nebulization Given 02/23/23 0756)  ipratropium-albuterol (DUONEB) 0.5-2.5 (3) MG/3ML nebulizer solution 3 mL (3 mLs Nebulization Given 02/23/23 [id number])    ED Course/ Medical Decision Making/ A&P                             Medical Decision Making Adult male with  history of COPD, active smoker history of prior acute respiratory failure presents with respiratory distress.  Suspicion for COPD exacerbation versus pneumonia versus sepsis versus SIRS.  Patient has no edema, lower suspicion for new heart failure, though he does have this on his history.  Patient received continuous albuterol, IV steroids, was monitored.  Amount and/or Complexity of Data Reviewed Independent Historian: EMS External Data Reviewed: notes. Labs: ordered. Decision-making details documented in ED Course. Radiology: ordered and independent interpretation performed. Decision-making details documented in ED Course. ECG/medicine tests: ordered and independent interpretation performed. Decision-making details documented in ED Course.  Risk Prescription drug management.   10:37 AM Patient awake, alert, in no distress, breathing markedly better. I reviewed his  x-ray, labs, minor abnormalities otherwise reassuring, COVID-negative, influenza negative no obvious infiltrate on x-ray.  Patient notes that he does have bronchodilators at home, given his improvement here, absence of hemodynamic instability, suspicion for COPD exacerbation, less likely viral process, heart failure exacerbation, patient will follow-up with primary care.        Final Clinical Impression(s) / ED Diagnoses Final diagnoses:  COPD exacerbation ([place])    Rx / DC Orders ED Discharge Orders          Ordered    predniSONE (DELTASONE) 20 MG tablet  Daily with breakfast        02/23/23 1037              Carmin Muskrat, MD 02/23/23 1037

## 2023-02-23 NOTE — Discharge Instructions (Signed)
Please be sure to follow-up with your primary care physician. Please obtain and take your steroids as prescribed.  For the next 2 days please use your albuterol every 4 hours.  You may then use it as needed.    Equally important is that you continue efforts to stop smoking.

## 2023-02-23 NOTE — ED Notes (Signed)
Discharge instructions, medications and follow up reviewed with patient.  Patient denies pain.  Respirations are even and unlabored, no acute distress noted

## 2023-03-08 ENCOUNTER — Encounter (HOSPITAL_COMMUNITY): Payer: Self-pay

## 2023-03-08 ENCOUNTER — Other Ambulatory Visit: Payer: Self-pay

## 2023-03-08 ENCOUNTER — Inpatient Hospital Stay (HOSPITAL_COMMUNITY)
Admission: EM | Admit: 2023-03-08 | Discharge: 2023-03-10 | DRG: 190 | Disposition: A | Discharge status: Home / Self Care | Payer: Medicaid Other | Attending: Internal Medicine | Admitting: Internal Medicine

## 2023-03-08 ENCOUNTER — Emergency Department (HOSPITAL_COMMUNITY): Payer: Medicaid Other

## 2023-03-08 DIAGNOSIS — J9621 Acute and chronic respiratory failure with hypoxia: Secondary | ICD-10-CM | POA: Diagnosis present

## 2023-03-08 DIAGNOSIS — I10 Essential (primary) hypertension: Secondary | ICD-10-CM | POA: Diagnosis present

## 2023-03-08 DIAGNOSIS — Z6823 Body mass index (BMI) 23.0-23.9, adult: Secondary | ICD-10-CM | POA: Diagnosis not present

## 2023-03-08 DIAGNOSIS — Z833 Family history of diabetes mellitus: Secondary | ICD-10-CM

## 2023-03-08 DIAGNOSIS — D72829 Elevated white blood cell count, unspecified: Secondary | ICD-10-CM | POA: Diagnosis present

## 2023-03-08 DIAGNOSIS — F17200 Nicotine dependence, unspecified, uncomplicated: Secondary | ICD-10-CM | POA: Diagnosis present

## 2023-03-08 DIAGNOSIS — Z720 Tobacco use: Secondary | ICD-10-CM | POA: Diagnosis present

## 2023-03-08 DIAGNOSIS — E440 Moderate protein-calorie malnutrition: Secondary | ICD-10-CM | POA: Diagnosis present

## 2023-03-08 DIAGNOSIS — Z7951 Long term (current) use of inhaled steroids: Secondary | ICD-10-CM

## 2023-03-08 DIAGNOSIS — E876 Hypokalemia: Secondary | ICD-10-CM | POA: Diagnosis present

## 2023-03-08 DIAGNOSIS — Z8249 Family history of ischemic heart disease and other diseases of the circulatory system: Secondary | ICD-10-CM | POA: Diagnosis not present

## 2023-03-08 DIAGNOSIS — J441 Chronic obstructive pulmonary disease with (acute) exacerbation: Principal | ICD-10-CM | POA: Diagnosis present

## 2023-03-08 DIAGNOSIS — Z79899 Other long term (current) drug therapy: Secondary | ICD-10-CM

## 2023-03-08 LAB — BASIC METABOLIC PANEL
Anion gap: 7 (ref 5–15)
BUN: 8 mg/dL (ref 6–20)
CO2: 27 mmol/L (ref 22–32)
Calcium: 8.2 mg/dL — ABNORMAL LOW (ref 8.9–10.3)
Chloride: 102 mmol/L (ref 98–111)
Creatinine, Ser: 0.82 mg/dL (ref 0.61–1.24)
GFR, Estimated: >60 mL/min (ref >60)
Glucose, Bld: 127 mg/dL — ABNORMAL HIGH (ref 70–99)
Potassium: 3.2 mmol/L — ABNORMAL LOW (ref 3.5–5.1)
Sodium: 136 mmol/L (ref 135–145)

## 2023-03-08 LAB — CBC
HCT: 46.8 % (ref 39.0–52.0)
Hemoglobin: 15.9 g/dL (ref 13.0–17.0)
MCH: 28.9 pg (ref 26.0–34.0)
MCHC: 34 g/dL (ref 30.0–36.0)
MCV: 84.9 fL (ref 80.0–100.0)
Platelets: 211 10*3/uL (ref 150–400)
RBC: 5.51 MIL/uL (ref 4.22–5.81)
RDW: 15 % (ref 11.5–15.5)
WBC: 17.4 10*3/uL — ABNORMAL HIGH (ref 4.0–10.5)
nRBC: 0 % (ref 0.0–0.2)

## 2023-03-08 LAB — BLOOD GAS, VENOUS
Acid-Base Excess: 4.9 mmol/L — ABNORMAL HIGH (ref 0.0–2.0)
Bicarbonate: 31.4 mmol/L — ABNORMAL HIGH (ref 20.0–28.0)
O2 Saturation: 96.1 %
Patient temperature: 37
pCO2, Ven: 53 mmHg (ref 44–60)
pH, Ven: 7.38 (ref 7.25–7.43)
pO2, Ven: 78 mmHg — ABNORMAL HIGH (ref 32–45)

## 2023-03-08 MED ORDER — BUSPIRONE HCL 5 MG PO TABS
5.0000 mg | ORAL_TABLET | Freq: Two times a day (BID) | ORAL | Status: DC
  Administered 2023-03-08 – 2023-03-10 (×4): 5 mg via ORAL
  Filled 2023-03-08 (×4): qty 1

## 2023-03-08 MED ORDER — AMLODIPINE BESYLATE 10 MG PO TABS
10.0000 mg | ORAL_TABLET | Freq: Every day | ORAL | Status: DC
  Administered 2023-03-09 – 2023-03-10 (×2): 10 mg via ORAL
  Filled 2023-03-08 (×2): qty 1

## 2023-03-08 MED ORDER — SODIUM CHLORIDE 0.9 % IV SOLN
1.0000 g | INTRAVENOUS | Status: DC
  Administered 2023-03-08: 1 g via INTRAVENOUS
  Filled 2023-03-08: qty 10

## 2023-03-08 MED ORDER — ISOSORBIDE MONONITRATE ER 60 MG PO TB24
60.0000 mg | ORAL_TABLET | Freq: Every day | ORAL | Status: DC
  Administered 2023-03-09 – 2023-03-10 (×2): 60 mg via ORAL
  Filled 2023-03-08 (×2): qty 1

## 2023-03-08 MED ORDER — SODIUM CHLORIDE 0.45 % IV SOLN: INTRAVENOUS | Status: DC

## 2023-03-08 MED ORDER — ENOXAPARIN SODIUM 40 MG/0.4ML IJ SOSY
40.0000 mg | PREFILLED_SYRINGE | INTRAMUSCULAR | Status: DC
  Administered 2023-03-09: 40 mg via SUBCUTANEOUS
  Filled 2023-03-08 (×2): qty 0.4

## 2023-03-08 MED ORDER — MAGNESIUM SULFATE 2 GM/50ML IV SOLN
2.0000 g | Freq: Once | INTRAVENOUS | Status: AC
  Administered 2023-03-08: 2 g via INTRAVENOUS
  Filled 2023-03-08: qty 50

## 2023-03-08 MED ORDER — LOSARTAN POTASSIUM 50 MG PO TABS
100.0000 mg | ORAL_TABLET | Freq: Every day | ORAL | Status: DC
  Administered 2023-03-09 – 2023-03-10 (×2): 100 mg via ORAL
  Filled 2023-03-08 (×2): qty 2

## 2023-03-08 MED ORDER — FLUTICASONE FUROATE-VILANTEROL 200-25 MCG/ACT IN AEPB
1.0000 | INHALATION_SPRAY | Freq: Every day | RESPIRATORY_TRACT | Status: DC
  Administered 2023-03-09 – 2023-03-10 (×2): 1 via RESPIRATORY_TRACT
  Filled 2023-03-08: qty 28

## 2023-03-08 MED ORDER — PREDNISONE 20 MG PO TABS
40.0000 mg | ORAL_TABLET | Freq: Every day | ORAL | Status: DC
  Administered 2023-03-10: 40 mg via ORAL
  Filled 2023-03-08 (×2): qty 2

## 2023-03-08 MED ORDER — METHYLPREDNISOLONE SODIUM SUCC 40 MG IJ SOLR
40.0000 mg | Freq: Two times a day (BID) | INTRAMUSCULAR | Status: AC
  Administered 2023-03-08 – 2023-03-09 (×2): 40 mg via INTRAVENOUS
  Filled 2023-03-08 (×2): qty 1

## 2023-03-08 MED ORDER — IPRATROPIUM-ALBUTEROL 0.5-2.5 (3) MG/3ML IN SOLN
3.0000 mL | Freq: Four times a day (QID) | RESPIRATORY_TRACT | Status: DC
  Administered 2023-03-09 (×2): 3 mL via RESPIRATORY_TRACT
  Filled 2023-03-08 (×2): qty 3

## 2023-03-08 MED ORDER — ALBUTEROL SULFATE (2.5 MG/3ML) 0.083% IN NEBU: 2.5000 mg | INHALATION_SOLUTION | RESPIRATORY_TRACT | Status: DC | PRN

## 2023-03-08 MED ORDER — ALBUTEROL SULFATE (2.5 MG/3ML) 0.083% IN NEBU: 3.0000 mL | INHALATION_SOLUTION | Freq: Four times a day (QID) | RESPIRATORY_TRACT | Status: DC | PRN

## 2023-03-08 MED ORDER — ALBUTEROL SULFATE (2.5 MG/3ML) 0.083% IN NEBU
10.0000 mg | INHALATION_SOLUTION | RESPIRATORY_TRACT | Status: AC
  Administered 2023-03-08: 10 mg via RESPIRATORY_TRACT
  Filled 2023-03-08: qty 12

## 2023-03-08 NOTE — ED Triage Notes (Signed)
Patient BIB [facility] from home. Has COPD and has taken multiple puffs of inhaler without relief. Felt short of breath all day.   EMS 20G left forearm 10 albuterol 1 atrovent 125mg  solumedrol

## 2023-03-08 NOTE — Progress Notes (Addendum)
Per MD, try pt off bipap.  Upon arrival to pt bedside, pt found already off bipap on room air.  Bipap taken off by RN per MD.  Spo2 88% on room air.  Pt placed on 2l nasal cannula, HR75, rr24, spo2 93%.  Pt stated he is feeling a lot better.  MD notified.

## 2023-03-08 NOTE — H&P (Signed)
History and Physical    Patient: Franklin Chavez [id number] DOB: 10-14-1961 DOA: 03/08/2023 DOS: the patient was seen and examined on 03/08/2023 PCP: [facility]  Patient coming from: Home  Chief Complaint:  Chief Complaint  Patient presents with   Shortness of Breath   HPI: Franklin Chavez is a 63 y.o. male with medical history significant of COPD, essential hypertension, general anxiety disorder, tobacco use disorder who has had recurrent admissions with COPD exacerbation presenting with cough shortness of breath and wheezing.  Symptoms have been going on for 3 days.  Patient said he ran out of his home medications.  He has been unable to get breathing treatments therefore.  He denies any fever or chills.  Denies any sick contacts.  Screening in the ER with blood work shows no evidence of pneumonia.  Also acute viral screen was negative.  Patient therefore is being admitted with acute exacerbation of COPD.  He has marked expiratory wheezing.  Review of Systems: As mentioned in the history of present illness. All other systems reviewed and are negative. Past Medical History:  Diagnosis Date   COPD (chronic obstructive pulmonary disease)    Essential hypertension    GAD (generalized anxiety disorder)    Poor dentition 12/13/2018   Tobacco use disorder 12/13/2018   History reviewed. No pertinent surgical history. Social History:  reports that he has been smoking. He has never used smokeless tobacco. He reports that he does not drink alcohol and does not use drugs.  No Known Allergies  Family History  Problem Relation Age of Onset   Diabetes Mellitus II Mother    Hypertension Mother     Prior to Admission medications   Medication Sig Start Date End Date Taking? Authorizing Provider  albuterol (VENTOLIN HFA) 108 (90 Base) MCG/ACT inhaler Inhale 1-2 puffs into the lungs every 6 (six) hours as needed for wheezing or shortness of breath. Patient taking  differently: Inhale 1 puff into the lungs every 6 (six) hours as needed for wheezing or shortness of breath. 11/07/22   Rai, Ripudeep K, MD  amLODipine (NORVASC) 10 MG tablet Take 10 mg by mouth daily.    [provider]  amLODipine (NORVASC) 5 MG tablet Take 2 tablets (10 mg total) by mouth daily. 02/19/23 04/20/23  Elgie Congo, MD  busPIRone (BUSPAR) 5 MG tablet Take 1 tablet (5 mg total) by mouth 2 (two) times daily. 11/07/22   Rai, Ripudeep K, MD  fluticasone-salmeterol (ADVAIR DISKUS) 250-50 MCG/ACT AEPB Inhale 1 puff into the lungs in the morning and at bedtime. 11/07/22   Rai, Vernelle Emerald, MD  guaiFENesin (MUCINEX) 600 MG 12 hr tablet Take 1 tablet (600 mg total) by mouth 2 (two) times daily. Patient not taking: Reported on 12/28/2022 12/22/22   Florencia Reasons, MD  isosorbide mononitrate (IMDUR) 60 MG 24 hr tablet Take 1 tablet (60 mg total) by mouth daily. 12/22/22   Florencia Reasons, MD  losartan (COZAAR) 100 MG tablet Take 1 tablet (100 mg total) by mouth daily. 12/22/22   Florencia Reasons, MD  predniSONE (DELTASONE) 20 MG tablet Take 2 tablets (40 mg total) by mouth daily with breakfast. For the next four days 02/23/23   Carmin Muskrat, MD  tiotropium (SPIRIVA HANDIHALER) 18 MCG inhalation capsule Place 1 capsule (18 mcg total) into inhaler and inhale daily. Patient not taking: Reported on 12/20/2022 11/07/22 02/05/23  Mendel Corning, MD    Physical Exam: Vitals:   03/08/23 1831 03/08/23 1900  03/08/23 2015 03/08/23 2052  BP: (!) 143/89 (!) 161/100 (!) 135/93 (!) 140/90  Pulse: 74 69 70 75  Resp: (!) 24 (!) 27 14 (!) 24  SpO2: 97% 99% 96% 93%  Weight:      Height:       Constitutional: Anxious, in mild respiratory distress Eyes: PERRL, lids and conjunctivae normal ENMT: Mucous membranes are moist. Posterior pharynx clear of any exudate or lesions.Normal dentition.  Neck: normal, supple, no masses, no thyromegaly Respiratory: decreased air entry bilaterally with marked expiratory wheezing, no  crackles. Normal respiratory effort. No accessory muscle use.  Cardiovascular: Regular rate and rhythm, no murmurs / rubs / gallops. No extremity edema. 2+ pedal pulses. No carotid bruits.  Abdomen: no tenderness, no masses palpated. No hepatosplenomegaly. Bowel sounds positive.  Musculoskeletal: Good range of motion, no joint swelling or tenderness, Skin: no rashes, lesions, ulcers. No induration Neurologic: CN 2-12 grossly intact. Sensation intact, DTR normal. Strength 5/5 in all 4.  Psychiatric: Normal judgment and insight. Alert and oriented x 3. Normal mood  Data Reviewed:  White count 17.4, potassium 3.2, calcium 8.2 glucose 127.  Acute viral screen is negative.  Chest x-ray shows no acute findings.  Assessment and Plan:   #1 acute on chronic respiratory failure with hypoxia: Secondary to COPD exacerbation.  Patient will be admitted.  Initiate treatment for COPD exacerbation.  Resolved overnight.  IV steroid, antibiotics, oxygen on breathing treatments.  #2 acute exacerbation of COPD: Continue treatment as above.  Patient should get refills at discharge.  He has appointment with his pulmonologist apparently next week.  #3 essential hypertension: Continue monitoring blood pressure.  #4 tobacco abuse: Counseling provided.  Patient has been offered nicotine patch.  #5 hypokalemia: Replete  #6 moderate protein calorie malnutrition: Encourage increased protein intake.     Advance Care Planning:   Code Status: Prior full code  Consults: None  Family Communication: No family at bedside  Severity of Illness: The appropriate patient status for this patient is OBSERVATION. Observation status is judged to be reasonable and necessary in order to provide the required intensity of service to ensure the patient's safety. The patient's presenting symptoms, physical exam findings, and initial radiographic and laboratory data in the context of their medical condition is felt to place them at  decreased risk for further clinical deterioration. Furthermore, it is anticipated that the patient will be medically stable for discharge from the hospital within 2 midnights of admission.   AuthorBarbette Merino, MD 03/08/2023 9:28 PM  For on call review [email or web address].

## 2023-03-08 NOTE — ED Provider Notes (Signed)
[place] EMERGENCY DEPARTMENT AT [facility] Provider Note   CSN: [id number] Arrival date & time: 03/08/23  1805     History  Chief Complaint  Patient presents with   Shortness of Breath    Franklin Chavez is a 62 y.o. male with a history of COPD, smoking, multiple ER and hospitalizations for COPD exacerbations, presenting to the ED by EMS concern for shortness of breath.  Patient reports breathing a lot worse this morning.  EMS gave the patient 2 rounds of DuoNeb breathing treatments, as well as 125 mg of IV Solu-Medrol.  Patient arrives with labored breathing.  HPI     Home Medications Prior to Admission medications   Medication Sig Start Date End Date Taking? Authorizing Provider  albuterol (VENTOLIN HFA) 108 (90 Base) MCG/ACT inhaler Inhale 1-2 puffs into the lungs every 6 (six) hours as needed for wheezing or shortness of breath. Patient taking differently: Inhale 1 puff into the lungs every 6 (six) hours as needed for wheezing or shortness of breath. 11/07/22   Rai, Ripudeep K, MD  amLODipine (NORVASC) 10 MG tablet Take 10 mg by mouth daily.    [provider]  amLODipine (NORVASC) 5 MG tablet Take 2 tablets (10 mg total) by mouth daily. 02/19/23 04/20/23  Mardene Sayer, MD  busPIRone (BUSPAR) 5 MG tablet Take 1 tablet (5 mg total) by mouth 2 (two) times daily. 11/07/22   Rai, Ripudeep K, MD  fluticasone-salmeterol (ADVAIR DISKUS) 250-50 MCG/ACT AEPB Inhale 1 puff into the lungs in the morning and at bedtime. 11/07/22   Rai, Delene Ruffini, MD  guaiFENesin (MUCINEX) 600 MG 12 hr tablet Take 1 tablet (600 mg total) by mouth 2 (two) times daily. Patient not taking: Reported on 12/28/2022 12/22/22   Albertine Grates, MD  isosorbide mononitrate (IMDUR) 60 MG 24 hr tablet Take 1 tablet (60 mg total) by mouth daily. 12/22/22   Albertine Grates, MD  losartan (COZAAR) 100 MG tablet Take 1 tablet (100 mg total) by mouth daily. 12/22/22   Albertine Grates, MD  predniSONE (DELTASONE) 20 MG tablet  Take 2 tablets (40 mg total) by mouth daily with breakfast. For the next four days 02/23/23   Gerhard Munch, MD  tiotropium (SPIRIVA HANDIHALER) 18 MCG inhalation capsule Place 1 capsule (18 mcg total) into inhaler and inhale daily. Patient not taking: Reported on 12/20/2022 11/07/22 02/05/23  Cathren Harsh, MD      Allergies    Patient has no known allergies.    Review of Systems   Review of Systems  Physical Exam Updated Vital Signs BP 134/85 (BP Location: Right Arm)   Pulse (!) 59   Temp 97.8 F (36.6 C) (Oral)   Resp 17   Ht 6' (1.829 m)   Wt 78 kg   SpO2 96%   BMI 23.32 kg/m  Physical Exam Constitutional:      General: He is not in acute distress. HENT:     Head: Normocephalic and atraumatic.  Eyes:     Conjunctiva/sclera: Conjunctivae normal.     Pupils: Pupils are equal, round, and reactive to light.  Cardiovascular:     Rate and Rhythm: Normal rate and regular rhythm.  Pulmonary:     Effort: Pulmonary effort is normal. No respiratory distress.     Comments: Speaking full sentences, retracting thoracic muscles with respirations, moving air poorly bilaterally, distant wheezing audible. Abdominal:     General: There is no distension.     Tenderness: There is  no abdominal tenderness.  Skin:    General: Skin is warm and dry.  Neurological:     General: No focal deficit present.     Mental Status: He is alert. Mental status is at baseline.  Psychiatric:        Mood and Affect: Mood normal.        Behavior: Behavior normal.     ED Results / Procedures / Treatments   Labs (all labs ordered are listed, but only abnormal results are displayed) Labs Reviewed  BLOOD GAS, VENOUS - Abnormal; Notable for the following components:      Result Value   pO2, Ven 78 (*)    Bicarbonate 31.4 (*)    Acid-Base Excess 4.9 (*)    All other components within normal limits  BASIC METABOLIC PANEL - Abnormal; Notable for the following components:   Potassium 3.2 (*)     Glucose, Bld 127 (*)    Calcium 8.2 (*)    All other components within normal limits  CBC - Abnormal; Notable for the following components:   WBC 17.4 (*)    All other components within normal limits  COMPREHENSIVE METABOLIC PANEL - Abnormal; Notable for the following components:   Glucose, Bld 252 (*)    Calcium 8.1 (*)    Total Protein 5.6 (*)    Albumin 3.2 (*)    Alkaline Phosphatase 33 (*)    All other components within normal limits  CBC - Abnormal; Notable for the following components:   WBC 13.3 (*)    All other components within normal limits  HIV ANTIBODY (ROUTINE TESTING W REFLEX)    EKG None  Radiology DG Chest Portable 1 View  Result Date: 03/08/2023 CLINICAL DATA:  Shortness of breath EXAM: PORTABLE CHEST 1 VIEW COMPARISON:  02/23/2023 and older FINDINGS: Hyperinflation. No consolidation, pneumothorax or effusion. No edema. Normal cardiopericardial silhouette. Overlapping cardiac leads. IMPRESSION: Hyperinflation.  No acute cardiopulmonary disease Electronically Signed   By: Karen KaysAshok  Gupta M.D.   On: 03/08/2023 18:38    Procedures .Critical Care  Performed by: Terald Sleeperrifan, Cora Brierley J, MD Authorized by: Terald Sleeperrifan, Jaydynn Wolford J, MD   Critical care provider statement:    Critical care time (minutes):  45   Critical care time was exclusive of:  Separately billable procedures and treating other patients   Critical care was necessary to treat or prevent imminent or life-threatening deterioration of the following conditions:  Respiratory failure   Critical care was time spent personally by me on the following activities:  Ordering and performing treatments and interventions, ordering and review of laboratory studies, ordering and review of radiographic studies, pulse oximetry, review of old charts, examination of patient and evaluation of patient's response to treatment Comments:     BiPAP, respiratory reassessment for COPD exacerbation     Medications Ordered in ED Medications   albuterol (PROVENTIL) (2.5 MG/3ML) 0.083% nebulizer solution 10 mg (0 mg Nebulization Stopped 03/08/23 1931)  amLODipine (NORVASC) tablet 10 mg (has no administration in time range)  isosorbide mononitrate (IMDUR) 24 hr tablet 60 mg (has no administration in time range)  losartan (COZAAR) tablet 100 mg (has no administration in time range)  busPIRone (BUSPAR) tablet 5 mg (5 mg Oral Given 03/08/23 2349)  albuterol (PROVENTIL) (2.5 MG/3ML) 0.083% nebulizer solution 3 mL (has no administration in time range)  fluticasone furoate-vilanterol (BREO ELLIPTA) 200-25 MCG/ACT 1 puff (has no administration in time range)  enoxaparin (LOVENOX) injection 40 mg (has no administration in time range)  methylPREDNISolone  sodium succinate (SOLU-MEDROL) 40 mg/mL injection 40 mg (40 mg Intravenous Given 03/08/23 2349)    Followed by  predniSONE (DELTASONE) tablet 40 mg (has no administration in time range)  ipratropium-albuterol (DUONEB) 0.5-2.5 (3) MG/3ML nebulizer solution 3 mL (3 mLs Nebulization Given 03/09/23 0200)  albuterol (PROVENTIL) (2.5 MG/3ML) 0.083% nebulizer solution 2.5 mg (has no administration in time range)  cefdinir (OMNICEF) capsule 300 mg (has no administration in time range)  magnesium sulfate IVPB 2 g 50 mL (0 g Intravenous Stopped 03/08/23 1931)    ED Course/ Medical Decision Making/ A&P Clinical Course as of 03/09/23 0758  Thu Mar 08, 2023  2050 Patient appears much more comfortable after his BiPAP and breathing treatment.  Will trial him off the BiPAP now. [MT]  2056 Pt comfortable off bipap [MT]  2114 Admitted to hospitalist [MT]    Clinical Course User Index [MT] Layani Foronda, Kermit BaloMatthew J, MD                             Medical Decision Making Amount and/or Complexity of Data Reviewed Labs: ordered. Radiology: ordered.  Risk Prescription drug management. Decision regarding hospitalization.   Patient was presented to ED with shortness of breath.  Clinically this is consistent with a  recurring COPD exacerbation.  He has been seen multiple times for this including earlier this week.  He is still on steroids at home, but was given an additional bolus by EMS with 25 mg en route to the hospital.  We will add on IV magnesium, continuous nebulizers.  I have ordered BiPAP for the patient's increased work of breathing.  Labs and venous gas were ordered.  Will continue to watch him closely  Xrays personally reviewed and interpretted - no emergent findings Labs personally reviewed, notable for normal pH and pCO2, WBC elevated in setting of chronic steroids, BMP largely unremarkable (mild hypoK in setting of frequent albuterol use)  Patient's medical condition carries high risk of morbidity and mortality.  He was encouraged to continue trying to quit smoking.  On reassessment he was stable after trial of bipap for medical admission.        Final Clinical Impression(s) / ED Diagnoses Final diagnoses:  COPD exacerbation    Rx / DC Orders ED Discharge Orders     None         Abrar Bilton, Kermit BaloMatthew J, MD 03/09/23 [phone]

## 2023-03-08 NOTE — Progress Notes (Addendum)
The patient has large fatty-pocket appearing lumps on his sacrum and back of his right thigh. The patient stated that he would like to have these areas assessed.

## 2023-03-08 NOTE — ED Notes (Signed)
Patient given sandwich and soda. 

## 2023-03-09 DIAGNOSIS — J441 Chronic obstructive pulmonary disease with (acute) exacerbation: Secondary | ICD-10-CM | POA: Diagnosis not present

## 2023-03-09 DIAGNOSIS — J9621 Acute and chronic respiratory failure with hypoxia: Secondary | ICD-10-CM | POA: Diagnosis not present

## 2023-03-09 LAB — COMPREHENSIVE METABOLIC PANEL
ALT: 20 U/L (ref 0–44)
AST: 19 U/L (ref 15–41)
Albumin: 3.2 g/dL — ABNORMAL LOW (ref 3.5–5.0)
Alkaline Phosphatase: 33 U/L — ABNORMAL LOW (ref 38–126)
Anion gap: 10 (ref 5–15)
BUN: 12 mg/dL (ref 6–20)
CO2: 26 mmol/L (ref 22–32)
Calcium: 8.1 mg/dL — ABNORMAL LOW (ref 8.9–10.3)
Chloride: 100 mmol/L (ref 98–111)
Creatinine, Ser: 0.95 mg/dL (ref 0.61–1.24)
GFR, Estimated: >60 mL/min (ref >60)
Glucose, Bld: 252 mg/dL — ABNORMAL HIGH (ref 70–99)
Potassium: 3.9 mmol/L (ref 3.5–5.1)
Sodium: 136 mmol/L (ref 135–145)
Total Bilirubin: 0.5 mg/dL (ref 0.3–1.2)
Total Protein: 5.6 g/dL — ABNORMAL LOW (ref 6.5–8.1)

## 2023-03-09 LAB — CBC
HCT: 43.4 % (ref 39.0–52.0)
Hemoglobin: 14.7 g/dL (ref 13.0–17.0)
MCH: 29.2 pg (ref 26.0–34.0)
MCHC: 33.9 g/dL (ref 30.0–36.0)
MCV: 86.1 fL (ref 80.0–100.0)
Platelets: 207 10*3/uL (ref 150–400)
RBC: 5.04 MIL/uL (ref 4.22–5.81)
RDW: 15.1 % (ref 11.5–15.5)
WBC: 13.3 10*3/uL — ABNORMAL HIGH (ref 4.0–10.5)
nRBC: 0 % (ref 0.0–0.2)

## 2023-03-09 LAB — HIV ANTIBODY (ROUTINE TESTING W REFLEX): HIV Screen 4th Generation wRfx: NONREACTIVE

## 2023-03-09 MED ORDER — IPRATROPIUM-ALBUTEROL 0.5-2.5 (3) MG/3ML IN SOLN
3.0000 mL | Freq: Two times a day (BID) | RESPIRATORY_TRACT | Status: DC
  Administered 2023-03-09 – 2023-03-10 (×2): 3 mL via RESPIRATORY_TRACT
  Filled 2023-03-09 (×2): qty 3

## 2023-03-09 MED ORDER — ENSURE ENLIVE PO LIQD: 237.0000 mL | Freq: Two times a day (BID) | ORAL | Status: DC

## 2023-03-09 MED ORDER — CEFDINIR 300 MG PO CAPS
300.0000 mg | ORAL_CAPSULE | Freq: Two times a day (BID) | ORAL | Status: DC
  Administered 2023-03-09 – 2023-03-10 (×3): 300 mg via ORAL
  Filled 2023-03-09 (×3): qty 1

## 2023-03-09 NOTE — Progress Notes (Signed)
No need of bipap at this time. Pt is resting, No resp distress noted.

## 2023-03-09 NOTE — Progress Notes (Signed)
Initial Nutrition Assessment  DOCUMENTATION CODES:   Not applicable  INTERVENTION:  - Liberalize to Regular diet.  - Ensure Plus High Protein po BID, each supplement provides 350 kcal and 20 grams of protein. - Monitor weight trends.   NUTRITION DIAGNOSIS:   Increased nutrient needs related to chronic illness (COPD) as evidenced by estimated needs.  GOAL:   Patient will meet greater than or equal to 90% of their needs  MONITOR:   PO intake, Supplement acceptance, Weight trends  REASON FOR ASSESSMENT:   Consult Assessment of nutrition requirement/status  ASSESSMENT:   62 y.o. male Past Medical History COPD, history of recurrent COPD exacerbation who presented in with cough wheezing and shortness of breath.  Patient laying in bed eating graham crackers at time of visit.  He reports a UBW of 165# and feels he may have gained a few pounds recently. Per EMR, patient appears to have lost 22# (13%) from November to January, which is significant for the time frame. However, he has since gained the weight back and is weighed at 172# this admission.   Patient endorses eating very well at home. Usually eats 3 meals a day plus several snacks. Appetite has been good recently and remains good since admit.   Discussed increased calorie/protein needs with COPD.  Patient agreeable to receive Ensure to support intake during admission.    Medications reviewed and include: -  Labs reviewed: -   NUTRITION - FOCUSED PHYSICAL EXAM:  Flowsheet Row Most Recent Value  Orbital Region No depletion  Upper Arm Region No depletion  Thoracic and Lumbar Region Mild depletion  Buccal Region No depletion  Temple Region Mild depletion  Clavicle Bone Region Mild depletion  Clavicle and Acromion Bone Region Mild depletion  Scapular Bone Region Unable to assess  Dorsal Hand No depletion  Patellar Region No depletion  Anterior Thigh Region No depletion  Posterior Calf Region No depletion  Edema  (RD Assessment) None  Hair Reviewed  Eyes Reviewed  Mouth Reviewed  Skin Reviewed  Nails Reviewed       Diet Order:   Diet Order             Diet regular Room service appropriate? Yes; Fluid consistency: Thin  Diet effective now                   EDUCATION NEEDS:  Education needs have been addressed  Skin:  Skin Assessment: Reviewed RN Assessment  Last BM:  4/3  Height:  Ht Readings from Last 1 Encounters:  03/08/23 6' (1.829 m)   Weight:  Wt Readings from Last 1 Encounters:  03/08/23 78 kg    BMI:  Body mass index is 23.32 kg/m.  Estimated Nutritional Needs:  Kcal:  2200-2350 kcals Protein:  95-110 grams Fluid:  >/= 2.2L    Shelle Iron RD, LDN For contact information, refer to [facility].

## 2023-03-09 NOTE — Evaluation (Signed)
Physical Therapy Evaluation Patient Details Name: Franklin Chavez MRN: [id number] DOB: 1961/06/11 Today's Date: 03/09/2023  History of Present Illness  Pt admitted from home 2* SOB and dx with acute on chronic hypoxic respiratory failure and COPD exacerbation.  Pt with hx of GAD and COPD  Clinical Impression  Pt admitted as above and currently demonstrating IND in all mobility task including up to ambulate 700' in hall without assistive device - mild SOB noted but sats remained 94% or higher on 3L.  Pt with no PT needs at this time and will sign off to nursing and mobility team.     Recommendations for follow up therapy are one component of a multi-disciplinary discharge planning process, led by the attending physician.  Recommendations may be updated based on patient status, additional functional criteria and insurance authorization.  Follow Up Recommendations       Assistance Recommended at Discharge    Patient can return home with the following       Equipment Recommendations None recommended by PT  Recommendations for Other Services       Functional Status Assessment Patient has not had a recent decline in their functional status     Precautions / Restrictions Precautions Precautions: None Restrictions Weight Bearing Restrictions: No      Mobility  Bed Mobility Overal bed mobility: Modified Independent             General bed mobility comments: No physical assist    Transfers Overall transfer level: Modified independent                 General transfer comment: no physical assist    Ambulation/Gait Ambulation/Gait assistance: Independent Gait Distance (Feet): 700 Feet Assistive device: None Gait Pattern/deviations: WFL(Within Functional Limits)       General Gait Details: Good balance and safety awareness  Stairs            Wheelchair Mobility    Modified Rankin (Stroke Patients Only)       Balance Overall balance assessment:  Independent                                           Pertinent Vitals/Pain Pain Assessment Pain Assessment: No/denies pain    Home Living Family/patient expects to be discharged to:: Private residence                   Additional Comments: Pt states he lives in his [place]    Prior Function Prior Level of Function : Independent/Modified Independent             Mobility Comments: Reports community ambulation without AD ADLs Comments: 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 [employer] and yard work     Higher education careers adviser   Dominant Hand: Right    Extremity/Trunk Assessment   Upper Extremity Assessment Upper Extremity Assessment: Overall WFL for tasks assessed    Lower Extremity Assessment Lower Extremity Assessment: Overall WFL for tasks assessed    Cervical / Trunk Assessment Cervical / Trunk Assessment: Normal  Communication   Communication: No difficulties  Cognition Arousal/Alertness: Awake/alert Behavior During Therapy: WFL for tasks assessed/performed Overall Cognitive Status: Within Functional Limits for tasks assessed  General Comments      Exercises     Assessment/Plan    PT Assessment Patient does not need any further PT services  PT Problem List         PT Treatment Interventions      PT Goals (Current goals can be found in the Care Plan section)  Acute Rehab PT Goals Patient Stated Goal: Go home breathing better PT Goal Formulation: All assessment and education complete, DC therapy    Frequency Min 1X/week     Co-evaluation               AM-PAC PT "6 Clicks" Mobility  Outcome Measure Help needed turning from your back to your side while in a flat bed without using bedrails?: None Help needed moving from lying on your back to sitting on the side of a flat bed without using bedrails?: None Help needed moving to and from a bed to a chair (including a  wheelchair)?: None Help needed standing up from a chair using your arms (e.g., wheelchair or bedside chair)?: None Help needed to walk in hospital room?: None Help needed climbing 3-5 steps with a railing? : None 6 Click Score: 24    End of Session Equipment Utilized During Treatment: Oxygen Activity Tolerance: Patient tolerated treatment well Patient left: in chair;with call bell/phone within reach Nurse Communication: Mobility status      Time: [id number] PT Time Calculation (min) (ACUTE ONLY): 24 min   Charges:   PT Evaluation $PT Eval Low Complexity: 1 Low          Mauro KaufmannHunter Kohle Winner PT Acute Rehabilitation Services Pager [phone] Office [phone]   Edra Riccardi 03/09/2023, 12:20 PM

## 2023-03-09 NOTE — Progress Notes (Addendum)
The patient's 02 walking test at saturation was 92 % on room air at 01:00.

## 2023-03-09 NOTE — Progress Notes (Signed)
OT Cancellation Note  Patient Details Name: Franklin Chavez MRN: [id number] DOB: 07-18-1961   Cancelled Treatment:    Reason Eval/Treat Not Completed: OT screened, no needs identified, will sign off.  Gizelle Whetsel D Emalene Welte 03/09/2023, 12:35 PM 03/09/2023  RP, OTR/L  Acute Rehabilitation Services  Office:  [phone]

## 2023-03-09 NOTE — Progress Notes (Signed)
[facility] PROGRESS NOTE    Progress Note  Franklin Chavez  JIR:[id number] DOB: 1961/07/17 DOA: 03/08/2023 PCP: [facility]     Brief Narrative:   Franklin Chavez is an 62 y.o. male Past Medical History COPD, also history of recurrent COPD exacerbation comes in with cough wheezing and shortness of breath that started 3 days prior to admission   Assessment/Plan:   Acute respiratory failure with hypoxia secondarily to COPD with acute exacerbation Still hypoxic requiring 3 L, has remained afebrile leukocytosis improved. Continue IV steroids and IV antibiotics. Out of bed to chair, encourage incentive spirometry. Ambulate and check saturations, check medications to orals.  Essential hypertension: Blood pressure stable continue current home regimen. Continue Norvasc Imdur losartan.  Tobacco abuse: Counseling.  Hypokalemia: Was repleted orally now resolved  Protein-calorie malnutrition, moderate noted   DVT prophylaxis: lovenox Family Communication:None Status is: Inpatient Remains inpatient appropriate because: Acute respiratory failure with hypoxia due to COPD exacerbation    Code Status:     Code Status Orders  (From admission, onward)           Start     Ordered   03/08/23 2127  Full code  Continuous       Question:  By:  Answer:  Consent: discussion documented in EHR   03/08/23 2128           Code Status History     Date Active Date Inactive Code Status Order ID Comments User Context   01/27/2023 2345 02/01/2023 1730 Full Code [id number]  Anselm Jungling, DO ED   01/09/2023 1441 01/11/2023 1604 Full Code [id number]  Teddy Spike, DO ED   12/28/2022 2035 12/30/2022 1925 Full Code [id number]  Gery Pray, MD ED   12/19/2022 2108 12/22/2022 2216 Full Code [id number]  Howerter, Chaney Born, DO ED   10/30/2022 1610 11/07/2022 1955 Full Code [id number]  Almon Hercules, MD ED   10/23/2022 0901 10/26/2022 1904 Full Code [id number]  Bobette Mo, MD ED   12/10/2018 1716 12/14/2018 1735 Full Code [id number]  Dominica Severin, MD ED         IV Access:   Peripheral IV   Procedures and diagnostic studies:   DG Chest Portable 1 View  Result Date: 03/08/2023 CLINICAL DATA:  Shortness of breath EXAM: PORTABLE CHEST 1 VIEW COMPARISON:  02/23/2023 and older FINDINGS: Hyperinflation. No consolidation, pneumothorax or effusion. No edema. Normal cardiopericardial silhouette. Overlapping cardiac leads. IMPRESSION: Hyperinflation.  No acute cardiopulmonary disease Electronically Signed   By: Karen Kays M.D.   On: 03/08/2023 18:38     Medical Consultants:   None.   Subjective:    Franklin Chavez relates his breathing is better.  Objective:    Vitals:   03/08/23 2145 03/08/23 2233 03/09/23 0100 03/09/23 0500  BP: (!) 141/88 (!) 151/85 (!) 156/80 134/85  Pulse: 74 66 66 (!) 59  Resp: 11 18  17   Temp: 97.6 F (36.4 C) 98.1 F (36.7 C) 98.9 F (37.2 C) 97.8 F (36.6 C)  TempSrc: Oral   Oral  SpO2: 94% 94% 93% 96%  Weight:      Height:       SpO2: 96 % O2 Flow Rate (L/min): 3 L/min FiO2 (%): 35 %   Intake/Output Summary (Last 24 hours) at 03/09/2023 4193 Last data filed at 03/09/2023 0600 Gross per 24 hour  Intake 1360 ml  Output --  Net 1360 ml   [employer]  03/08/23 1819  Weight: 78 kg    Exam: General exam: In no acute distress. Respiratory system: Good air movement and clear to auscultation. Cardiovascular system: S1 & S2 heard, RRR. No JVD. Gastrointestinal system: Abdomen is nondistended, soft and nontender.  Extremities: No pedal edema. Skin: No rashes, lesions or ulcers Psychiatry: Judgement and insight appear normal. Mood & affect appropriate.    Data Reviewed:    Labs: Basic Metabolic Panel: Recent Labs  Lab 03/08/23 1824 03/09/23 0309  NA 136 136  K 3.2* 3.9  CL 102 100  CO2 27 26  GLUCOSE 127* 252*  BUN 8 12  CREATININE 0.82 0.95  CALCIUM 8.2* 8.1*   GFR Estimated  Creatinine Clearance: 90.8 mL/min (by C-G formula based on SCr of 0.95 mg/dL). Liver Function Tests: Recent Labs  Lab 03/09/23 0309  AST 19  ALT 20  ALKPHOS 33*  BILITOT 0.5  PROT 5.6*  ALBUMIN 3.2*   No results for input(s): "LIPASE", "AMYLASE" in the last 168 hours. No results for input(s): "AMMONIA" in the last 168 hours. Coagulation profile No results for input(s): "INR", "PROTIME" in the last 168 hours. COVID-19 Labs  No results for input(s): "DDIMER", "FERRITIN", "LDH", "CRP" in the last 72 hours.  Lab Results  Component Value Date   SARSCOV2NAA NEGATIVE 02/23/2023   SARSCOV2NAA NEGATIVE 02/19/2023   SARSCOV2NAA NEGATIVE 01/28/2023   SARSCOV2NAA NEGATIVE 01/09/2023    CBC: Recent Labs  Lab 03/08/23 1824 03/09/23 0309  WBC 17.4* 13.3*  HGB 15.9 14.7  HCT 46.8 43.4  MCV 84.9 86.1  PLT 211 207   Cardiac Enzymes: No results for input(s): "CKTOTAL", "CKMB", "CKMBINDEX", "TROPONINI" in the last 168 hours. BNP (last 3 results) No results for input(s): "PROBNP" in the last 8760 hours. CBG: No results for input(s): "GLUCAP" in the last 168 hours. D-Dimer: No results for input(s): "DDIMER" in the last 72 hours. Hgb A1c: No results for input(s): "HGBA1C" in the last 72 hours. Lipid Profile: No results for input(s): "CHOL", "HDL", "LDLCALC", "TRIG", "CHOLHDL", "LDLDIRECT" in the last 72 hours. Thyroid function studies: No results for input(s): "TSH", "T4TOTAL", "T3FREE", "THYROIDAB" in the last 72 hours.  Invalid input(s): "FREET3" Anemia work up: No results for input(s): "VITAMINB12", "FOLATE", "FERRITIN", "TIBC", "IRON", "RETICCTPCT" in the last 72 hours. Sepsis Labs: Recent Labs  Lab 03/08/23 1824 03/09/23 0309  WBC 17.4* 13.3*   Microbiology No results found for this or any previous visit (from the past 240 hour(s)).   Medications:    amLODipine  10 mg Oral Daily   busPIRone  5 mg Oral BID   enoxaparin (LOVENOX) injection  40 mg Subcutaneous Q24H    fluticasone furoate-vilanterol  1 puff Inhalation Daily   ipratropium-albuterol  3 mL Nebulization Q6H   isosorbide mononitrate  60 mg Oral Daily   losartan  100 mg Oral Daily   methylPREDNISolone (SOLU-MEDROL) injection  40 mg Intravenous Q12H   Followed by   Melene Muller[START ON 03/10/2023] predniSONE  40 mg Oral Q breakfast   Continuous Infusions:  sodium chloride 100 mL/hr at 03/08/23 2355   cefTRIAXone (ROCEPHIN)  IV 1 g (03/08/23 2358)      LOS: 1 day   Marinda ElkAbraham Feliz Ortiz  [facility]  03/09/2023, 7:12 AM

## 2023-03-10 DIAGNOSIS — J441 Chronic obstructive pulmonary disease with (acute) exacerbation: Secondary | ICD-10-CM | POA: Diagnosis not present

## 2023-03-10 DIAGNOSIS — E876 Hypokalemia: Secondary | ICD-10-CM | POA: Diagnosis not present

## 2023-03-10 MED ORDER — DOXYCYCLINE HYCLATE 100 MG PO TABS: 100.0000 mg | ORAL_TABLET | Freq: Two times a day (BID) | ORAL | 0 refills | Status: AC

## 2023-03-10 MED ORDER — PREDNISONE 20 MG PO TABS: 40.0000 mg | ORAL_TABLET | Freq: Every day | ORAL | 0 refills | Status: AC

## 2023-03-10 MED ORDER — ALBUTEROL SULFATE HFA 108 (90 BASE) MCG/ACT IN AERS: 1.0000 | INHALATION_SPRAY | Freq: Four times a day (QID) | RESPIRATORY_TRACT | 1 refills | Status: DC | PRN

## 2023-03-10 NOTE — Discharge Summary (Signed)
Physician Discharge Summary  Franklin Chavez ZOX:[id number] DOB: 22-Apr-1961 DOA: 03/08/2023  PCP: [facility]  Admit date: 03/08/2023 Discharge date: 03/10/2023  Admitted From: Home Disposition:  Home  Recommendations for Outpatient Follow-up:  Follow up with PCP in 1-2 weeks Please obtain BMP/CBC in one week   Home Health:No Equipment/Devices:None  Discharge Condition:Stable CODE STATUS:Full Diet recommendation: Heart Healthy  Brief/Interim Summary: 62 y.o. male Past Medical History COPD, also history of recurrent COPD exacerbation comes in with cough wheezing and shortness of breath that started 3 days prior to admission   Discharge Diagnoses:  Principal Problem:   COPD with acute exacerbation Active Problems:   Tobacco abuse   Hypokalemia   HTN (hypertension)   COPD exacerbation   Protein-calorie malnutrition, moderate  Acute respiratory failure with hypoxia secondary to COPD exacerbation: He was placed on steroids IV antibiotics and oxygen 3 L. His respiration improved we were able to wean him to room air. Steroids changed to oral antibiotic changed to oral he will continue antibiotic and steroid treatments as an outpatient.  Essential hypertension: No changes made to his medication continue current regimen.  Tobacco abuse: He has been counseled.  Hypokalemia: Replete orally now resolved.  Protein caloric malnutrition: Noted.  Discharge Instructions  Discharge Instructions     Diet - low sodium heart healthy   Complete by: As directed    Increase activity slowly   Complete by: As directed       Allergies as of 03/10/2023   No Known Allergies      Medication List     TAKE these medications    albuterol 108 (90 Base) MCG/ACT inhaler Commonly known as: VENTOLIN HFA Inhale 1-2 puffs into the lungs every 6 (six) hours as needed for wheezing or shortness of breath.   amLODipine 10 MG tablet Commonly known as: NORVASC Take 10  mg by mouth daily. What changed: Another medication with the same name was removed. Continue taking this medication, and follow the directions you see here.   busPIRone 5 MG tablet Commonly known as: BUSPAR Take 1 tablet (5 mg total) by mouth 2 (two) times daily.   doxycycline 100 MG tablet Commonly known as: VIBRA-TABS Take 1 tablet (100 mg total) by mouth 2 (two) times daily for 5 days.   fluticasone-salmeterol 250-50 MCG/ACT Aepb Commonly known as: Advair Diskus Inhale 1 puff into the lungs in the morning and at bedtime.   guaiFENesin 600 MG 12 hr tablet Commonly known as: MUCINEX Take 1 tablet (600 mg total) by mouth 2 (two) times daily.   isosorbide mononitrate 60 MG 24 hr tablet Commonly known as: IMDUR Take 1 tablet (60 mg total) by mouth daily.   losartan 100 MG tablet Commonly known as: COZAAR Take 1 tablet (100 mg total) by mouth daily.   predniSONE 20 MG tablet Commonly known as: DELTASONE Take 2 tablets (40 mg total) by mouth daily with breakfast for 3 days. What changed: additional instructions   tiotropium 18 MCG inhalation capsule Commonly known as: Spiriva HandiHaler Place 1 capsule (18 mcg total) into inhaler and inhale daily.        No Known Allergies  Consultations: None   Procedures/Studies: DG Chest Portable 1 View  Result Date: 03/08/2023 CLINICAL DATA:  Shortness of breath EXAM: PORTABLE CHEST 1 VIEW COMPARISON:  02/23/2023 and older FINDINGS: Hyperinflation. No consolidation, pneumothorax or effusion. No edema. Normal cardiopericardial silhouette. Overlapping cardiac leads. IMPRESSION: Hyperinflation.  No acute cardiopulmonary disease Electronically Signed  By: Karen Kays M.D.   On: 03/08/2023 18:38   DG Chest Port 1 View  Result Date: 02/23/2023 CLINICAL DATA:  Shortness of breath, history COPD, smoking, hypertension EXAM: PORTABLE CHEST 1 VIEW COMPARISON:  Portable exam 0731 hours compared to 02/19/2023 FINDINGS: Normal heart size,  mediastinal contours, and pulmonary vascularity. Lungs hyperinflated but clear. No acute infiltrate, pleural effusion, or pneumothorax. Skin fold projects over LEFT upper lobe. Osseous structures unremarkable. IMPRESSION: Hyperinflated lungs without acute infiltrate. Electronically Signed   By: Ulyses Southward M.D.   On: 02/23/2023 08:31   DG Chest Portable 1 View  Result Date: 02/19/2023 CLINICAL DATA:  Shortness of breath.  History of COPD. EXAM: PORTABLE CHEST 1 VIEW COMPARISON:  CT examination dated October 28, 2022 FINDINGS: The heart size and mediastinal contours are within normal limits. Hyperinflated lungs with emphysematous changes of the upper lobes. No focal consolidation or pleural effusion. The visualized skeletal structures are unremarkable. IMPRESSION: Hyperinflated lungs with emphysematous changes of the upper lobes. No acute cardiopulmonary process. Electronically Signed   By: Larose Hires D.O.   On: 02/19/2023 21:48   (Echo, Carotid, EGD, Colonoscopy, ERCP)    Subjective: No complaints  Discharge Exam: Vitals:   03/09/23 2104 03/10/23 0508  BP: 128/74 123/82  Pulse: 63 61  Resp: 18 18  Temp: 98.5 F (36.9 C) 98.1 F (36.7 C)  SpO2: 99% 98%   Vitals:   03/09/23 1313 03/09/23 2040 03/09/23 2104 03/10/23 0508  BP: (!) 142/87  128/74 123/82  Pulse: 76  63 61  Resp: 15  18 18   Temp: 98.5 F (36.9 C)  98.5 F (36.9 C) 98.1 F (36.7 C)  TempSrc: Oral   Oral  SpO2: 100% 95% 99% 98%  Weight:      Height:        General: Pt is alert, awake, not in acute distress Cardiovascular: RRR, S1/S2 +, no rubs, no gallops Respiratory: CTA bilaterally, no wheezing, no rhonchi Abdominal: Soft, NT, ND, bowel sounds + Extremities: no edema, no cyanosis    The results of significant diagnostics from this hospitalization (including imaging, microbiology, ancillary and laboratory) are listed below for reference.     Microbiology: No results found for this or any previous visit  (from the past 240 hour(s)).   Labs: BNP (last 3 results) Recent Labs    01/09/23 0750 01/27/23 2056 02/19/23 2143  BNP 29.5 19.5 14.9   Basic Metabolic Panel: Recent Labs  Lab 03/08/23 1824 03/09/23 0309  NA 136 136  K 3.2* 3.9  CL 102 100  CO2 27 26  GLUCOSE 127* 252*  BUN 8 12  CREATININE 0.82 0.95  CALCIUM 8.2* 8.1*   Liver Function Tests: Recent Labs  Lab 03/09/23 0309  AST 19  ALT 20  ALKPHOS 33*  BILITOT 0.5  PROT 5.6*  ALBUMIN 3.2*   No results for input(s): "LIPASE", "AMYLASE" in the last 168 hours. No results for input(s): "AMMONIA" in the last 168 hours. CBC: Recent Labs  Lab 03/08/23 1824 03/09/23 0309  WBC 17.4* 13.3*  HGB 15.9 14.7  HCT 46.8 43.4  MCV 84.9 86.1  PLT 211 207   Cardiac Enzymes: No results for input(s): "CKTOTAL", "CKMB", "CKMBINDEX", "TROPONINI" in the last 168 hours. BNP: Invalid input(s): "POCBNP" CBG: No results for input(s): "GLUCAP" in the last 168 hours. D-Dimer No results for input(s): "DDIMER" in the last 72 hours. Hgb A1c No results for input(s): "HGBA1C" in the last 72 hours. Lipid Profile No results for  input(s): "CHOL", "HDL", "LDLCALC", "TRIG", "CHOLHDL", "LDLDIRECT" in the last 72 hours. Thyroid function studies No results for input(s): "TSH", "T4TOTAL", "T3FREE", "THYROIDAB" in the last 72 hours.  Invalid input(s): "FREET3" Anemia work up No results for input(s): "VITAMINB12", "FOLATE", "FERRITIN", "TIBC", "IRON", "RETICCTPCT" in the last 72 hours. Urinalysis    Component Value Date/Time   COLORURINE YELLOW 12/20/2022 0404   APPEARANCEUR HAZY (A) 12/20/2022 0404   LABSPEC 1.015 12/20/2022 0404   PHURINE 5.0 12/20/2022 0404   GLUCOSEU >=500 (A) 12/20/2022 0404   HGBUR NEGATIVE 12/20/2022 0404   BILIRUBINUR NEGATIVE 12/20/2022 0404   KETONESUR NEGATIVE 12/20/2022 0404   PROTEINUR NEGATIVE 12/20/2022 0404   NITRITE NEGATIVE 12/20/2022 0404   LEUKOCYTESUR NEGATIVE 12/20/2022 0404   Sepsis  Labs Recent Labs  Lab 03/08/23 1824 03/09/23 0309  WBC 17.4* 13.3*   Microbiology No results found for this or any previous visit (from the past 240 hour(s)).   Time coordinating discharge: Over 30 minutes  SIGNED:   Marinda Elk, MD  [facility] 03/10/2023, 7:35 AM Pager   If 7PM-7AM, please contact night-coverage [email or web address] Password [email or web address]

## 2023-03-10 NOTE — Progress Notes (Signed)
Patient provided and explained dc instructions in detail. Patient alert and oriented verbalizing understanding of instructions. Patient states ride is here; Attempting to get wheelchair to escort patient to lobby but patient is adamant to ambulate to lobby for discharge himself. Patient left room and entered elevators prior to being able to obtain wheel chair for discharge.

## 2023-03-10 NOTE — Plan of Care (Signed)
  Problem: Health Behavior/Discharge Planning: Goal: Ability to manage health-related needs will improve Outcome: Progressing   Problem: Clinical Measurements: Goal: Ability to maintain clinical measurements within normal limits will improve Outcome: Progressing   Problem: Clinical Measurements: Goal: Will remain free from infection Outcome: Progressing   

## 2023-03-15 ENCOUNTER — Institutional Professional Consult (permissible substitution): Payer: Medicaid Other | Admitting: Pulmonary Disease

## 2023-03-16 ENCOUNTER — Emergency Department (HOSPITAL_COMMUNITY): Payer: Medicaid Other

## 2023-03-16 ENCOUNTER — Emergency Department (HOSPITAL_COMMUNITY)
Admission: EM | Admit: 2023-03-16 | Discharge: 2023-03-16 | Disposition: A | Discharge status: Home / Self Care | Payer: Medicaid Other | Attending: Emergency Medicine | Admitting: Emergency Medicine

## 2023-03-16 ENCOUNTER — Other Ambulatory Visit: Payer: Self-pay

## 2023-03-16 DIAGNOSIS — Z1152 Encounter for screening for COVID-19: Secondary | ICD-10-CM | POA: Diagnosis not present

## 2023-03-16 DIAGNOSIS — R0602 Shortness of breath: Secondary | ICD-10-CM | POA: Diagnosis present

## 2023-03-16 DIAGNOSIS — D72829 Elevated white blood cell count, unspecified: Secondary | ICD-10-CM | POA: Insufficient documentation

## 2023-03-16 DIAGNOSIS — Z79899 Other long term (current) drug therapy: Secondary | ICD-10-CM | POA: Insufficient documentation

## 2023-03-16 DIAGNOSIS — Z7951 Long term (current) use of inhaled steroids: Secondary | ICD-10-CM | POA: Insufficient documentation

## 2023-03-16 DIAGNOSIS — I10 Essential (primary) hypertension: Secondary | ICD-10-CM | POA: Insufficient documentation

## 2023-03-16 DIAGNOSIS — J441 Chronic obstructive pulmonary disease with (acute) exacerbation: Secondary | ICD-10-CM

## 2023-03-16 LAB — CBC WITH DIFFERENTIAL/PLATELET
Abs Immature Granulocytes: 0.09 10*3/uL — ABNORMAL HIGH (ref 0.00–0.07)
Basophils Absolute: 0.1 10*3/uL (ref 0.0–0.1)
Basophils Relative: 1 %
Eosinophils Absolute: 0.6 10*3/uL — ABNORMAL HIGH (ref 0.0–0.5)
Eosinophils Relative: 6 %
HCT: 40.6 % (ref 39.0–52.0)
Hemoglobin: 13.5 g/dL (ref 13.0–17.0)
Immature Granulocytes: 1 %
Lymphocytes Relative: 13 %
Lymphs Abs: 1.4 10*3/uL (ref 0.7–4.0)
MCH: 28.2 pg (ref 26.0–34.0)
MCHC: 33.3 g/dL (ref 30.0–36.0)
MCV: 84.9 fL (ref 80.0–100.0)
Monocytes Absolute: 1 10*3/uL (ref 0.1–1.0)
Monocytes Relative: 9 %
Neutro Abs: 7.8 10*3/uL — ABNORMAL HIGH (ref 1.7–7.7)
Neutrophils Relative %: 70 %
Platelets: 297 10*3/uL (ref 150–400)
RBC: 4.78 MIL/uL (ref 4.22–5.81)
RDW: 14.7 % (ref 11.5–15.5)
WBC: 10.9 10*3/uL — ABNORMAL HIGH (ref 4.0–10.5)
nRBC: 0 % (ref 0.0–0.2)

## 2023-03-16 LAB — BLOOD GAS, VENOUS
Acid-Base Excess: 8.3 mmol/L — ABNORMAL HIGH (ref 0.0–2.0)
Bicarbonate: 33.4 mmol/L — ABNORMAL HIGH (ref 20.0–28.0)
O2 Saturation: 98.7 %
Patient temperature: 37
pCO2, Ven: 47 mmHg (ref 44–60)
pH, Ven: 7.46 — ABNORMAL HIGH (ref 7.25–7.43)
pO2, Ven: 81 mmHg — ABNORMAL HIGH (ref 32–45)

## 2023-03-16 LAB — BASIC METABOLIC PANEL
Anion gap: 6 (ref 5–15)
BUN: 13 mg/dL (ref 6–20)
CO2: 28 mmol/L (ref 22–32)
Calcium: 8.5 mg/dL — ABNORMAL LOW (ref 8.9–10.3)
Chloride: 102 mmol/L (ref 98–111)
Creatinine, Ser: 0.77 mg/dL (ref 0.61–1.24)
GFR, Estimated: >60 mL/min (ref >60)
Glucose, Bld: 122 mg/dL — ABNORMAL HIGH (ref 70–99)
Potassium: 3.8 mmol/L (ref 3.5–5.1)
Sodium: 136 mmol/L (ref 135–145)

## 2023-03-16 LAB — RESP PANEL BY RT-PCR (RSV, FLU A&B, COVID)  RVPGX2
Influenza A by PCR: NEGATIVE
Influenza B by PCR: NEGATIVE
Resp Syncytial Virus by PCR: NEGATIVE
SARS Coronavirus 2 by RT PCR: NEGATIVE

## 2023-03-16 MED ORDER — PREDNISONE 20 MG PO TABS: 40.0000 mg | ORAL_TABLET | Freq: Every day | ORAL | 0 refills | Status: AC

## 2023-03-16 MED ORDER — ALBUTEROL SULFATE HFA 108 (90 BASE) MCG/ACT IN AERS: 1.0000 | INHALATION_SPRAY | Freq: Four times a day (QID) | RESPIRATORY_TRACT | 0 refills | Status: DC | PRN

## 2023-03-16 MED ORDER — ALBUTEROL SULFATE (2.5 MG/3ML) 0.083% IN NEBU
10.0000 mg/h | INHALATION_SOLUTION | Freq: Once | RESPIRATORY_TRACT | Status: AC
  Administered 2023-03-16: 10 mg/h via RESPIRATORY_TRACT
  Filled 2023-03-16: qty 12

## 2023-03-16 NOTE — ED Provider Notes (Signed)
[place] EMERGENCY DEPARTMENT AT [facility] Provider Note   CSN: [id number] Arrival date & time: 03/16/23  7829     History  Chief Complaint  Patient presents with   Shortness of Breath    Franklin Chavez is a 62 y.o. male with a past medical history significant for COPD, tobacco abuse, hypertension, multiple ER visits and hospitalizations for COPD exacerbation who presents to the ED due to shortness of breath that started this morning.  Patient states he ran out of all of his medication yesterday and has not taken any for the past 24 hours, patient most concerned about his prednisone. It is unclear whether or not patient picked up his prescriptions after discharge on 4/6 for prednisone and doxycycline. Patient drowsy on exam and continuously needs to be woken up to answer questions.  Patient notes he did not sleep last night which is what he attributes to his drowsiness.  Denies associated chest pain.  No history of CHF.  Denies lower extremity edema.  No history of blood clots.  Chart reviewed.  Patient has had numerous admissions for COPD exacerbation.  Most recent admission was 4/4 to 4/6.  Patient discharged with doxycycline and prednisone which is unclear whether or not he took.  Patient given 125 Solu-Medrol and DuoNeb treatment by EMS prior to arrival.  History obtained from patient and past medical records. No interpreter used during encounter.       Home Medications Prior to Admission medications   Medication Sig Start Date End Date Taking? Authorizing Provider  albuterol (VENTOLIN HFA) 108 (90 Base) MCG/ACT inhaler Inhale 1-2 puffs into the lungs every 6 (six) hours as needed for wheezing or shortness of breath. 03/16/23  Yes Sarahann Horrell, Merla Riches, PA-C  predniSONE (DELTASONE) 20 MG tablet Take 2 tablets (40 mg total) by mouth daily for 5 days. 03/16/23 03/21/23 Yes Billal Rollo, Merla Riches, PA-C  albuterol (VENTOLIN HFA) 108 (90 Base) MCG/ACT inhaler Inhale 1-2 puffs into  the lungs every 6 (six) hours as needed for wheezing or shortness of breath. 03/10/23   Marinda Elk, MD  amLODipine (NORVASC) 10 MG tablet Take 10 mg by mouth daily.    [provider]  busPIRone (BUSPAR) 5 MG tablet Take 1 tablet (5 mg total) by mouth 2 (two) times daily. 11/07/22   Rai, Ripudeep K, MD  fluticasone-salmeterol (ADVAIR DISKUS) 250-50 MCG/ACT AEPB Inhale 1 puff into the lungs in the morning and at bedtime. 11/07/22   Rai, Delene Ruffini, MD  guaiFENesin (MUCINEX) 600 MG 12 hr tablet Take 1 tablet (600 mg total) by mouth 2 (two) times daily. 12/22/22   Albertine Grates, MD  isosorbide mononitrate (IMDUR) 60 MG 24 hr tablet Take 1 tablet (60 mg total) by mouth daily. 12/22/22   Albertine Grates, MD  losartan (COZAAR) 100 MG tablet Take 1 tablet (100 mg total) by mouth daily. 12/22/22   Albertine Grates, MD  tiotropium (SPIRIVA HANDIHALER) 18 MCG inhalation capsule Place 1 capsule (18 mcg total) into inhaler and inhale daily. 11/07/22 03/09/23  Cathren Harsh, MD      Allergies    Patient has no known allergies.    Review of Systems   Review of Systems  Constitutional:  Negative for chills and fever.  Respiratory:  Positive for shortness of breath.   Cardiovascular:  Negative for chest pain and leg swelling.    Physical Exam Updated Vital Signs BP (!) 151/87   Pulse 83   Temp 97.6 F (36.4 C) (  Oral)   Resp (!) 22   Ht  (2.032 m)   Wt 77.1 kg   SpO2 92%   BMI 18.68 kg/m  Physical Exam Vitals and nursing note reviewed.  Constitutional:      General: He is in acute distress.     Appearance: He is ill-appearing.  HENT:     Head: Normocephalic.  Eyes:     Pupils: Pupils are equal, round, and reactive to light.  Cardiovascular:     Rate and Rhythm: Normal rate and regular rhythm.     Pulses: Normal pulses.     Heart sounds: Normal heart sounds. No murmur heard.    No friction rub. No gallop.  Pulmonary:     Breath sounds: Wheezing present.     Comments: Decreased air movement  throughout. Wheeze Abdominal:     General: Abdomen is flat. There is no distension.     Palpations: Abdomen is soft.     Tenderness: There is no abdominal tenderness. There is no guarding or rebound.  Musculoskeletal:        General: Normal range of motion.     Cervical back: Neck supple.  Skin:    General: Skin is warm and dry.  Neurological:     General: No focal deficit present.     Mental Status: He is alert.     Comments: Drowsy on exam, patient needs to be woken up to answer questions, but answers appropriately   Psychiatric:        Mood and Affect: Mood normal.        Behavior: Behavior normal.     ED Results / Procedures / Treatments   Labs (all labs ordered are listed, but only abnormal results are displayed) Labs Reviewed  CBC WITH DIFFERENTIAL/PLATELET - Abnormal; Notable for the following components:      Result Value   WBC 10.9 (*)    Neutro Abs 7.8 (*)    Eosinophils Absolute 0.6 (*)    Abs Immature Granulocytes 0.09 (*)    All other components within normal limits  BASIC METABOLIC PANEL - Abnormal; Notable for the following components:   Glucose, Bld 122 (*)    Calcium 8.5 (*)    All other components within normal limits  BLOOD GAS, VENOUS - Abnormal; Notable for the following components:   pH, Ven 7.46 (*)    pO2, Ven 81 (*)    Bicarbonate 33.4 (*)    Acid-Base Excess 8.3 (*)    All other components within normal limits  RESP PANEL BY RT-PCR (RSV, FLU A&B, COVID)  [id number]    EKG EKG Interpretation  Date/Time:  Friday March 16 2023 07:51:18 EDT Ventricular Rate:  76 PR Interval:  147 QRS Duration: 93 QT Interval:  420 QTC Calculation: 473 R Axis:   91 Text Interpretation: Sinus rhythm No significant change since last tracing Confirmed by Melene Plan [phone]) on 03/16/2023 11:16:32 AM  Radiology DG Chest Portable 1 View  Result Date: 03/16/2023 CLINICAL DATA:  SOB EXAM: PORTABLE CHEST - 1 VIEW COMPARISON:  03/08/2023 FINDINGS: Cardiac silhouette is  unremarkable. No pneumothorax or pleural effusion. The lungs are clear. The visualized skeletal structures are unremarkable. IMPRESSION: No acute cardiopulmonary process. Electronically Signed   By: Layla Maw M.D.   On: 03/16/2023 08:28    Procedures .Critical Care  Performed by: Mannie Stabile, PA-C Authorized by: Mannie Stabile, PA-C   Critical care provider statement:    Critical care time (minutes):  41  Critical care was necessary to treat or prevent imminent or life-threatening deterioration of the following conditions:  Respiratory failure   Critical care was time spent personally by me on the following activities:  Development of treatment plan with patient or surrogate, discussions with consultants, evaluation of patient's response to treatment, examination of patient, ordering and review of laboratory studies, ordering and review of radiographic studies, ordering and performing treatments and interventions, pulse oximetry, re-evaluation of patient's condition and review of old charts   I assumed direction of critical care for this patient from another provider in my specialty: no       Medications Ordered in ED Medications  albuterol (PROVENTIL) (2.5 MG/3ML) 0.083% nebulizer solution (10 mg/hr Nebulization Given 03/16/23 0802)    ED Course/ Medical Decision Making/ A&P Clinical Course as of 03/16/23 1117  Fri Mar 16, 2023  0757 Called RT to start BiPAP [CA]  0839 Reassessed patient at bedside.  Patient on BiPAP.  Patient appears more alert and awake.  Patient requesting food and watching TV. [CA]  0839 WBC(!): 10.9 [CA]  0917 WBC(!): 10.9 [CA]  0942 Reassessed patient at bedside.  Lungs clear to auscultation bilaterally.  No wheeze.  Will trial patient off BiPAP.  Patient hopeful he is able to go home. [CA]    Clinical Course User Index [CA] Mannie Stabile, PA-C                             Medical Decision Making Amount and/or Complexity of Data  Reviewed Independent Historian: EMS External Data Reviewed: notes.    Details: Previous admission notes Labs: ordered. Decision-making details documented in ED Course. Radiology: ordered and independent interpretation performed. Decision-making details documented in ED Course. ECG/medicine tests: ordered and independent interpretation performed. Decision-making details documented in ED Course.  Risk Prescription drug management.   This patient presents to the ED for concern of SOB, this involves an extensive number of treatment options, and is a complaint that carries with it a high risk of complications and morbidity.  The differential diagnosis includes COPD exacerbation, pneumonia, PE, viral process, etc  62 year old male presents to the ED due to shortness of breath that started earlier this morning.  History of COPD with numerous admissions for COPD exacerbation.  Patient states he ran out of his medications yesterday.  Last admission was 4/4 to 4/6.  Patient given 125 Solu-Medrol and DuoNeb by EMS prior to arrival.  Upon arrival, patient afebrile, not tachycardic.  Currently on 2L [place]. Decreased air movement throughout with wheeze. Patient drowsy on exam and needs to be woken up numerous times during initial evaluation. After discussion with Dr. Adela Lank, it was decided to start patient on BiPAP due to drowsiness. Unclear whether drowsiness secondary to SOB vs lack of sleep. RT called to bedside. Labs ordered. CXR to rule out PNA. CAT started. Lower suspicion for PE.  COVID/influenza negative.  CBC significant for mild leukocytosis at 10.9, likely due to recent steroids.  BMP significant for hyperglycemia 122.  No anion gap.  Normal renal function.  No major electrolyte derangements.  VBG with elevated pH at 7.46 and bicarb at 33.  Chest x-ray personally reviewed and interpreted which is negative for signs of pneumonia, pneumothorax, widened mediastinum. EKG with baseline ST elevation. No change  from previous EKG after discussion with Dr. Adela Lank. Patient denies chest pain. Low suspicion for ACS.   Reassessed patient a few times as noted above.  10:40 AM reassessed patient at bedside. Has been off BiPAP. O2 saturation at 90% with good waveform on room air.  Patient admits to resolution in shortness of breath.  Patient able to ambulate with O2 saturation between 92 and 95%. Lungs clear to auscultation bilaterally. No wheeze. Offered admission for COPD exacerbation given low oxygen saturation on monitor however, patient declined and prefers to be discharged home with a description for prednisone.  Suspect patient is chronically that low. No productive cough, will hold antibiotics. Patient has a pulmonology appointment the beginning of May.  Advised patient to call to see if they have an earlier appointment due to repeat ED visits and admissions for COPD exacerbation.  Patient advised to return to the ED if he develops worsening shortness of breath. Strict ED precautions discussed with patient. Patient states understanding and agrees to plan. Patient discharged home in no acute distress and stable vitals  Has PCP Hx COPD       Final Clinical Impression(s) / ED Diagnoses Final diagnoses:  COPD exacerbation    Rx / DC Orders ED Discharge Orders          Ordered    predniSONE (DELTASONE) 20 MG tablet  Daily        03/16/23 1045    albuterol (VENTOLIN HFA) 108 (90 Base) MCG/ACT inhaler  Every 6 hours PRN        03/16/23 1045              Mannie Stabile, PA-C 03/16/23 1117    Melene Plan, DO 03/16/23 1216

## 2023-03-16 NOTE — Discharge Instructions (Addendum)
It was a pleasure taking care of you today.  As discussed, your chest x-ray did not show evidence of pneumonia.  I suspect you are having a flareup in your COPD.  You were offered admission which you declined.  I am sending you home with steroids and an inhaler.  Take steroids for the next 5 days.  Please call your pulmonologist to see if they can get you in earlier.  Return to the ER for new or worsening symptoms.

## 2023-03-16 NOTE — ED Notes (Signed)
Patient able to ambulate in hall way without complaint of SOB, chest pain or discomfort. O2 stayed between 92-95% RA.

## 2023-03-16 NOTE — ED Notes (Signed)
RN removed patient from BiPap per MD order, respiratory notified, food and nutrition supplied to patient.

## 2023-03-16 NOTE — ED Triage Notes (Signed)
Patient brought in from home by EMS with c/o SOB that started this morning.  Patient is A&Ox4, denies chest tightness or discomfort, states he ran out of his prednisone and needs a refill. EMS gave Duoneb and Solu-Medrol on truck and started 20g IV in R forearm. 140/90 100% RA 75

## 2023-03-16 NOTE — Progress Notes (Signed)
   03/16/23 0756  BiPAP/CPAP/SIPAP  BiPAP/CPAP/SIPAP Pt Type Adult  BiPAP/CPAP/SIPAP V60  Mask Type Full face mask  Mask Size Large  Set Rate (S)  12 breaths/min  Respiratory Rate 19 breaths/min  IPAP (S)  12 cmH20  EPAP (S)  6 cmH2O  FiO2 (%) (S)  35 %  Flow Rate 1.1 lpm  Minute Ventilation 13.9  Leak 21  Peak Inspiratory Pressure (PIP) 13  Tidal Volume (Vt) 756  Patient Home Equipment No  Auto Titrate No  Press High Alarm 25 cmH2O  Press Low Alarm 5 cmH2O  Nasal massage performed No (comment)  CPAP/SIPAP surface wiped down Yes  Oxygen Percent 35 %  BiPAP/CPAP /SiPAP Vitals  Pulse Rate 79  Resp 19  SpO2 97 %  Bilateral Breath Sounds Expiratory wheezes;Diminished  MEWS Score/Color  MEWS Score 0  MEWS Score Color [place] Si

## 2023-04-04 ENCOUNTER — Institutional Professional Consult (permissible substitution): Payer: Medicaid Other | Admitting: Pulmonary Disease

## 2023-04-05 ENCOUNTER — Emergency Department (HOSPITAL_COMMUNITY)
Admission: EM | Admit: 2023-04-05 | Discharge: 2023-04-05 | Disposition: A | Discharge status: Home / Self Care | Payer: Medicaid Other | Attending: Emergency Medicine | Admitting: Emergency Medicine

## 2023-04-05 ENCOUNTER — Emergency Department (HOSPITAL_COMMUNITY): Payer: Medicaid Other

## 2023-04-05 ENCOUNTER — Other Ambulatory Visit: Payer: Self-pay

## 2023-04-05 DIAGNOSIS — J441 Chronic obstructive pulmonary disease with (acute) exacerbation: Secondary | ICD-10-CM | POA: Diagnosis not present

## 2023-04-05 DIAGNOSIS — Z7951 Long term (current) use of inhaled steroids: Secondary | ICD-10-CM | POA: Insufficient documentation

## 2023-04-05 DIAGNOSIS — I110 Hypertensive heart disease with heart failure: Secondary | ICD-10-CM | POA: Insufficient documentation

## 2023-04-05 DIAGNOSIS — F17210 Nicotine dependence, cigarettes, uncomplicated: Secondary | ICD-10-CM | POA: Diagnosis not present

## 2023-04-05 DIAGNOSIS — I5032 Chronic diastolic (congestive) heart failure: Secondary | ICD-10-CM | POA: Diagnosis not present

## 2023-04-05 DIAGNOSIS — Z79899 Other long term (current) drug therapy: Secondary | ICD-10-CM | POA: Insufficient documentation

## 2023-04-05 DIAGNOSIS — R0602 Shortness of breath: Secondary | ICD-10-CM | POA: Diagnosis present

## 2023-04-05 LAB — CBC WITH DIFFERENTIAL/PLATELET
Abs Immature Granulocytes: 0.04 10*3/uL (ref 0.00–0.07)
Basophils Absolute: 0.1 10*3/uL (ref 0.0–0.1)
Basophils Relative: 1 %
Eosinophils Absolute: 0.5 10*3/uL (ref 0.0–0.5)
Eosinophils Relative: 5 %
HCT: 41.7 % (ref 39.0–52.0)
Hemoglobin: 14.1 g/dL (ref 13.0–17.0)
Immature Granulocytes: 0 %
Lymphocytes Relative: 16 %
Lymphs Abs: 1.5 10*3/uL (ref 0.7–4.0)
MCH: 28.6 pg (ref 26.0–34.0)
MCHC: 33.8 g/dL (ref 30.0–36.0)
MCV: 84.6 fL (ref 80.0–100.0)
Monocytes Absolute: 1.1 10*3/uL — ABNORMAL HIGH (ref 0.1–1.0)
Monocytes Relative: 11 %
Neutro Abs: 6.5 10*3/uL (ref 1.7–7.7)
Neutrophils Relative %: 67 %
Platelets: 305 10*3/uL (ref 150–400)
RBC: 4.93 MIL/uL (ref 4.22–5.81)
RDW: 14.3 % (ref 11.5–15.5)
WBC: 9.7 10*3/uL (ref 4.0–10.5)
nRBC: 0 % (ref 0.0–0.2)

## 2023-04-05 LAB — BASIC METABOLIC PANEL
Anion gap: 11 (ref 5–15)
BUN: 9 mg/dL (ref 6–20)
CO2: 25 mmol/L (ref 22–32)
Calcium: 8.6 mg/dL — ABNORMAL LOW (ref 8.9–10.3)
Chloride: 101 mmol/L (ref 98–111)
Creatinine, Ser: 1.03 mg/dL (ref 0.61–1.24)
GFR, Estimated: >60 mL/min (ref >60)
Glucose, Bld: 102 mg/dL — ABNORMAL HIGH (ref 70–99)
Potassium: 3.4 mmol/L — ABNORMAL LOW (ref 3.5–5.1)
Sodium: 137 mmol/L (ref 135–145)

## 2023-04-05 LAB — BLOOD GAS, VENOUS
Acid-Base Excess: 4.8 mmol/L — ABNORMAL HIGH (ref 0.0–2.0)
Bicarbonate: 29.9 mmol/L — ABNORMAL HIGH (ref 20.0–28.0)
O2 Saturation: 91.9 %
Patient temperature: 37
pCO2, Ven: 45 mmHg (ref 44–60)
pH, Ven: 7.43 (ref 7.25–7.43)
pO2, Ven: 56 mmHg — ABNORMAL HIGH (ref 32–45)

## 2023-04-05 LAB — BRAIN NATRIURETIC PEPTIDE: B Natriuretic Peptide: 52.5 pg/mL (ref 0.0–100.0)

## 2023-04-05 MED ORDER — PREDNISONE 50 MG PO TABS: 50.0000 mg | ORAL_TABLET | Freq: Every day | ORAL | 0 refills | Status: AC

## 2023-04-05 MED ORDER — ALBUTEROL SULFATE HFA 108 (90 BASE) MCG/ACT IN AERS: 1.0000 | INHALATION_SPRAY | Freq: Four times a day (QID) | RESPIRATORY_TRACT | 1 refills | Status: AC | PRN

## 2023-04-05 MED ORDER — AZITHROMYCIN 250 MG PO TABS: 250.0000 mg | ORAL_TABLET | Freq: Every day | ORAL | 0 refills | Status: DC

## 2023-04-05 MED ORDER — METHYLPREDNISOLONE SODIUM SUCC 125 MG IJ SOLR: 125.0000 mg | Freq: Once | INTRAMUSCULAR | Status: DC

## 2023-04-05 MED ORDER — IPRATROPIUM-ALBUTEROL 0.5-2.5 (3) MG/3ML IN SOLN
3.0000 mL | Freq: Once | RESPIRATORY_TRACT | Status: AC
  Administered 2023-04-05: 3 mL via RESPIRATORY_TRACT
  Filled 2023-04-05: qty 3

## 2023-04-05 NOTE — Discharge Instructions (Addendum)
It was a pleasure caring for you today in the emergency department.  Please return to the emergency department for any worsening or worrisome symptoms.  Please follow up with pcp and lung doctor in regards to COPD  Please stop smoking

## 2023-04-05 NOTE — ED Notes (Signed)
Pt. Ambulated down the hall and back to his room on 90% room air without difficulty. Pt.gait steady on his feet.

## 2023-04-05 NOTE — ED Triage Notes (Signed)
Pt BIB EMS for SOB that started 2 days ago. Per EMS, pt has run out of COPD meds. Pt received a duoneb and 125mg  of solumedrol. Per EMS, pt was hypoxic on their arrival at 85% on RA. Pt has hx of COPD.

## 2023-04-05 NOTE — ED Provider Notes (Signed)
[place] EMERGENCY DEPARTMENT AT [facility] Provider Note  CSN: [id number] Arrival date & time: 04/05/23 0018  Chief Complaint(s) Shortness of Breath  HPI Franklin Chavez is a 62 y.o. male with past medical history as below, significant for COPD, hypertension, GAD, tobacco use, cocaine use, diastolic heart failure who presents to the ED with complaint of diff breathing.  Patient reports he ran out of his prednisone 3 days ago and since then has been having progressive difficulty breathing worsened from his baseline.  Worsened with exertion, has some chest tightness when he experiences difficulty breathing.  Significant chest pain otherwise.  No significant weight changes, does report productive cough with white, occasionally yellow sputum.  Continues to smoke cigarettes.  Denies home oxygen use.  Reports no improvement after using his home albuterol. Reports was supposed to see pulm specialist but not f/u.  Past Medical History Past Medical History:  Diagnosis Date   COPD (chronic obstructive pulmonary disease) (HCC)    Essential hypertension    GAD (generalized anxiety disorder)    Poor dentition 12/13/2018   Tobacco use disorder 12/13/2018   Patient Active Problem List   Diagnosis Date Noted   Cocaine abuse (HCC) 01/09/2023   GAD (generalized anxiety disorder) 01/09/2023   Acute exacerbation of chronic obstructive pulmonary disease (COPD) (HCC) 12/19/2022   Acute respiratory failure with hypoxia (HCC) 12/19/2022   SIRS (systemic inflammatory response syndrome) (HCC) 12/19/2022   Leukocytosis 12/19/2022   Chronic diastolic CHF (congestive heart failure) (HCC) 12/19/2022   Protein-calorie malnutrition, moderate (HCC) 11/01/2022   Homelessness 10/30/2022   Arthritis 10/25/2022   COPD exacerbation (HCC) 10/24/2022   COPD with acute exacerbation (HCC) 10/23/2022   Hyperglycemia 10/23/2022   Hypokalemia 10/23/2022   HTN (hypertension) 10/23/2022   Tobacco abuse  12/13/2018   Poor dentition 12/13/2018   Foreign body of left middle finger with infection 12/10/2018   Home Medication(s) Prior to Admission medications   Medication Sig Start Date End Date Taking? Authorizing Provider  albuterol (VENTOLIN HFA) 108 (90 Base) MCG/ACT inhaler Inhale 1-2 puffs into the lungs every 6 (six) hours as needed for wheezing or shortness of breath. 04/05/23  Yes Tanda Rockers A, DO  azithromycin (ZITHROMAX) 250 MG tablet Take 1 tablet (250 mg total) by mouth daily. Take first 2 tablets together, then 1 every day until finished. 04/05/23  Yes Sloan Leiter, DO  predniSONE (DELTASONE) 50 MG tablet Take 1 tablet (50 mg total) by mouth daily for 5 days. 04/05/23 04/10/23 Yes Tanda Rockers A, DO  albuterol (VENTOLIN HFA) 108 (90 Base) MCG/ACT inhaler Inhale 1-2 puffs into the lungs every 6 (six) hours as needed for wheezing or shortness of breath. 03/10/23   Marinda Elk, MD  albuterol (VENTOLIN HFA) 108 (90 Base) MCG/ACT inhaler Inhale 1-2 puffs into the lungs every 6 (six) hours as needed for wheezing or shortness of breath. 03/16/23   Mannie Stabile, PA-C  amLODipine (NORVASC) 10 MG tablet Take 10 mg by mouth daily.    [provider]  busPIRone (BUSPAR) 5 MG tablet Take 1 tablet (5 mg total) by mouth 2 (two) times daily. 11/07/22   Rai, Ripudeep K, MD  fluticasone-salmeterol (ADVAIR DISKUS) 250-50 MCG/ACT AEPB Inhale 1 puff into the lungs in the morning and at bedtime. 11/07/22   Rai, Delene Ruffini, MD  guaiFENesin (MUCINEX) 600 MG 12 hr tablet Take 1 tablet (600 mg total) by mouth 2 (two) times daily. 12/22/22   Albertine Grates, MD  isosorbide mononitrate (  IMDUR) 60 MG 24 hr tablet Take 1 tablet (60 mg total) by mouth daily. 12/22/22   Albertine Grates, MD  losartan (COZAAR) 100 MG tablet Take 1 tablet (100 mg total) by mouth daily. 12/22/22   Albertine Grates, MD  tiotropium (SPIRIVA HANDIHALER) 18 MCG inhalation capsule Place 1 capsule (18 mcg total) into inhaler and inhale daily. 11/07/22  03/09/23  Cathren Harsh, MD                                                                                                                                    Past Surgical History No past surgical history on file. Family History Family History  Problem Relation Age of Onset   Diabetes Mellitus II Mother    Hypertension Mother     Social History Social History   Tobacco Use   Smoking status: Every Day   Smokeless tobacco: Never  Substance Use Topics   Alcohol use: No   Drug use: No   Allergies Patient has no known allergies.  Review of Systems Review of Systems  Constitutional:  Positive for fatigue. Negative for chills and fever.  HENT:  Negative for facial swelling and trouble swallowing.   Eyes:  Negative for photophobia and visual disturbance.  Respiratory:  Positive for cough, chest tightness and shortness of breath.   Cardiovascular:  Negative for chest pain and palpitations.  Gastrointestinal:  Negative for abdominal pain, nausea and vomiting.  Endocrine: Negative for polydipsia and polyuria.  Genitourinary:  Negative for difficulty urinating and hematuria.  Musculoskeletal:  Negative for gait problem and joint swelling.  Skin:  Negative for pallor and rash.  Neurological:  Negative for syncope and headaches.  Psychiatric/Behavioral:  Negative for agitation and confusion.     Physical Exam Vital Signs  I have reviewed the triage vital signs BP 124/77   Pulse 65   Temp 97.9 F (36.6 C)   Resp 18   Ht 6\' 8"  (2.032 m)   Wt 77.1 kg   SpO2 90%   BMI 18.68 kg/m  Physical Exam Vitals and nursing note reviewed.  Constitutional:      General: He is not in acute distress.    Appearance: He is well-developed.  HENT:     Head: Normocephalic and atraumatic.     Jaw: No trismus.     Comments: Poor dentition     Right Ear: External ear normal.     Left Ear: External ear normal.     Mouth/Throat:     Mouth: Mucous membranes are moist.  Eyes:     General: No  scleral icterus. Cardiovascular:     Rate and Rhythm: Normal rate and regular rhythm.     Pulses: Normal pulses.     Heart sounds: Normal heart sounds.  Pulmonary:     Effort: Pulmonary effort is normal. No respiratory distress.     Breath sounds: Decreased breath sounds and wheezing present.  Comments: Diminished bilateral, trace wheezing bilateral Abdominal:     General: Abdomen is flat.     Palpations: Abdomen is soft.     Tenderness: There is no abdominal tenderness.  Musculoskeletal:     Right lower leg: No edema.     Left lower leg: No edema.  Skin:    General: Skin is warm and dry.     Capillary Refill: Capillary refill takes less than 2 seconds.  Neurological:     Mental Status: He is alert and oriented to person, place, and time.  Psychiatric:        Mood and Affect: Mood normal.        Behavior: Behavior normal.     ED Results and Treatments Labs (all labs ordered are listed, but only abnormal results are displayed) Labs Reviewed  BASIC METABOLIC PANEL - Abnormal; Notable for the following components:      Result Value   Potassium 3.4 (*)    Glucose, Bld 102 (*)    Calcium 8.6 (*)    All other components within normal limits  CBC WITH DIFFERENTIAL/PLATELET - Abnormal; Notable for the following components:   Monocytes Absolute 1.1 (*)    All other components within normal limits  BLOOD GAS, VENOUS - Abnormal; Notable for the following components:   pO2, Ven 56 (*)    Bicarbonate 29.9 (*)    Acid-Base Excess 4.8 (*)    All other components within normal limits  BRAIN NATRIURETIC PEPTIDE                                                                                                                          Radiology DG Chest Port 1 View  Result Date: 04/05/2023 CLINICAL DATA:  Dyspnea EXAM: PORTABLE CHEST 1 VIEW COMPARISON:  03/16/2023 FINDINGS: The lungs are hyperinflated in keeping with changes of underlying COPD. The lungs are clear. No pneumothorax or  pleural effusion. Cardiac size within normal limits. Pulmonary vascularity is normal. No acute bone abnormality. IMPRESSION: 1. No active disease. COPD. Electronically Signed   By: Helyn Numbers M.D.   On: 04/05/2023 00:50    Pertinent labs & imaging results that were available during my care of the patient were reviewed by me and considered in my medical decision making (see MDM for details).  Medications Ordered in ED Medications  ipratropium-albuterol (DUONEB) 0.5-2.5 (3) MG/3ML nebulizer solution 3 mL (3 mLs Nebulization Given 04/05/23 0050)  Procedures Procedures  (including critical care time)  Medical Decision Making / ED Course    Medical Decision Making:    ESKER DEVER is a 62 y.o. male with past medical history as below, significant for COPD, hypertension, GAD, tobacco use, cocaine use, diastolic heart failure who presents to the ED with complaint of diff breathing. . The complaint involves an extensive differential diagnosis and also carries with it a high risk of complications and morbidity.  Serious etiology was considered. Ddx includes but is not limited to: In my evaluation of this patient's dyspnea my DDx includes, but is not limited to, pneumonia, pulmonary embolism, pneumothorax, pulmonary edema, metabolic acidosis, asthma, COPD, cardiac cause, anemia, anxiety, etc.    Complete initial physical exam performed, notably the patient  was no acute distress, resting comfortably on room air, no conversational dyspnea.  Pulse ox 90-91%..    Given DuoNeb and Solu-Medrol prior to arrival by EMS Reviewed and confirmed nursing documentation for past medical history, family history, social history.  Vital signs reviewed.    Clinical Course as of 04/05/23 0622  Thu Apr 05, 2023  1610 Feeling better on recheck, ambulatory with pulse ox 90% on RA. No  conversational dyspnea. When asleep pulse ox will drop to 88-89%, when awake pulse ox 89-92%, when talking will increase to 94-95% on ambient air [SG]  0521 Tolerating PO w/o difficulty [SG]  0521 No ongoing hypoxia, feeling better after tx here in ED. Concern for copd exacerbation, does not appear to require hospitalization  [SG]    Clinical Course User Index [SG] Tanda Rockers A, DO    Dyspnea most suggestive of COPD exacerbation. No respiratory distress, wob improved with duonebs and steroids. Feeling better at this time, stable for discharge following re-assessment.  The patient improved significantly and was discharged in stable condition. Detailed discussions were had with the patient regarding current findings, and need for close f/u with PCP or on call doctor. The patient has been instructed to return immediately if the symptoms worsen in any way for re-evaluation. Patient verbalized understanding and is in agreement with current care plan. All questions answered prior to discharge.        Additional history obtained: -Additional history obtained from na -External records from outside source obtained and reviewed including: Chart review including previous notes, labs, imaging, consultation notes including prior ED visits, medications, prior labs and imaging Multiple ED visits secondary to difficulty breathing, was recently admitted last month with similar   Lab Tests: -I ordered, reviewed, and interpreted labs.   The pertinent results include:   Labs Reviewed  BASIC METABOLIC PANEL - Abnormal; Notable for the following components:      Result Value   Potassium 3.4 (*)    Glucose, Bld 102 (*)    Calcium 8.6 (*)    All other components within normal limits  CBC WITH DIFFERENTIAL/PLATELET - Abnormal; Notable for the following components:   Monocytes Absolute 1.1 (*)    All other components within normal limits  BLOOD GAS, VENOUS - Abnormal; Notable for the following  components:   pO2, Ven 56 (*)    Bicarbonate 29.9 (*)    Acid-Base Excess 4.8 (*)    All other components within normal limits  BRAIN NATRIURETIC PEPTIDE    Notable for stable  EKG   EKG Interpretation  Date/Time:  Thursday Apr 05 2023 00:26:38 EDT Ventricular Rate:  76 PR Interval:  145 QRS Duration: 95 QT Interval:  413 QTC Calculation: 465 R  Axis:   97 Text Interpretation: Sinus rhythm RSR' in V1 or V2, probably normal variant Consider left ventricular hypertrophy ST elevation, consider anterolateral injury similar to prior no stemi Confirmed by Tanda Rockers ([id number]) on 04/05/2023 12:53:23 AM         Imaging Studies ordered: I ordered imaging studies including cxr I independently visualized the following imaging with scope of interpretation limited to determining acute life threatening conditions related to emergency care; findings noted above, significant for COPD  I independently visualized and interpreted imaging. I agree with the radiologist interpretation   Medicines ordered and prescription drug management: Meds ordered this encounter  Medications   ipratropium-albuterol (DUONEB) 0.5-2.5 (3) MG/3ML nebulizer solution 3 mL   DISCONTD: methylPREDNISolone sodium succinate (SOLU-MEDROL) 125 mg/2 mL injection 125 mg    IV methylprednisolone will be converted to either a q12h or q24h frequency with the same total daily dose (TDD).  Ordered Dose: 1 to 125 mg TDD; convert to: TDD q24h.  Ordered Dose: 126 to 250 mg TDD; convert to: TDD div q12h.  Ordered Dose: >250 mg TDD; DAW.   azithromycin (ZITHROMAX) 250 MG tablet    Sig: Take 1 tablet (250 mg total) by mouth daily. Take first 2 tablets together, then 1 every day until finished.    Dispense:  6 tablet    Refill:  0   predniSONE (DELTASONE) 50 MG tablet    Sig: Take 1 tablet (50 mg total) by mouth daily for 5 days.    Dispense:  5 tablet    Refill:  0   albuterol (VENTOLIN HFA) 108 (90 Base) MCG/ACT inhaler    Sig:  Inhale 1-2 puffs into the lungs every 6 (six) hours as needed for wheezing or shortness of breath.    Dispense:  1 each    Refill:  1    -I have reviewed the patients home medicines and have made adjustments as needed   Consultations Obtained: na   Cardiac Monitoring: The patient was maintained on a cardiac monitor.  I personally viewed and interpreted the cardiac monitored which showed an underlying rhythm of: NSR Pulse ox reviewed, similar to prior  Social Determinants of Health:  Diagnosis or treatment significantly limited by social determinants of health: current smoker Counseled patient for approximately 3 minutes regarding smoking cessation. Discussed risks of smoking and how they applied and affected their visit here today. Patient not ready to quit at this time, however will follow up with their primary doctor when they are.   CPT code: 16109: intermediate counseling for smoking cessation     Reevaluation: After the interventions noted above, I reevaluated the patient and found that they have improved  Co morbidities that complicate the patient evaluation  Past Medical History:  Diagnosis Date   COPD (chronic obstructive pulmonary disease) (HCC)    Essential hypertension    GAD (generalized anxiety disorder)    Poor dentition 12/13/2018   Tobacco use disorder 12/13/2018      Dispostion: Disposition decision including need for hospitalization was considered, and patient discharged from emergency department.    Final Clinical Impression(s) / ED Diagnoses Final diagnoses:  COPD exacerbation (HCC)     This chart was dictated using voice recognition software.  Despite best efforts to proofread,  errors can occur which can change the documentation meaning.    Sloan Leiter, DO 04/05/23 [phone]

## 2023-04-20 ENCOUNTER — Encounter: Payer: Self-pay | Admitting: Pulmonary Disease

## 2023-05-06 ENCOUNTER — Encounter (HOSPITAL_COMMUNITY): Payer: Self-pay

## 2023-05-06 ENCOUNTER — Inpatient Hospital Stay (HOSPITAL_COMMUNITY)
Admission: EM | Admit: 2023-05-06 | Discharge: 2023-05-07 | DRG: 190 | Disposition: A | Discharge status: Home / Self Care | Payer: Medicaid Other | Attending: Internal Medicine | Admitting: Internal Medicine

## 2023-05-06 ENCOUNTER — Other Ambulatory Visit: Payer: Self-pay

## 2023-05-06 ENCOUNTER — Emergency Department (HOSPITAL_COMMUNITY): Payer: Medicaid Other

## 2023-05-06 DIAGNOSIS — Z1152 Encounter for screening for COVID-19: Secondary | ICD-10-CM | POA: Diagnosis not present

## 2023-05-06 DIAGNOSIS — Z7951 Long term (current) use of inhaled steroids: Secondary | ICD-10-CM

## 2023-05-06 DIAGNOSIS — Z833 Family history of diabetes mellitus: Secondary | ICD-10-CM

## 2023-05-06 DIAGNOSIS — I10 Essential (primary) hypertension: Secondary | ICD-10-CM | POA: Diagnosis present

## 2023-05-06 DIAGNOSIS — Z79899 Other long term (current) drug therapy: Secondary | ICD-10-CM

## 2023-05-06 DIAGNOSIS — F17200 Nicotine dependence, unspecified, uncomplicated: Secondary | ICD-10-CM | POA: Diagnosis present

## 2023-05-06 DIAGNOSIS — E876 Hypokalemia: Secondary | ICD-10-CM | POA: Diagnosis present

## 2023-05-06 DIAGNOSIS — J9621 Acute and chronic respiratory failure with hypoxia: Secondary | ICD-10-CM | POA: Diagnosis present

## 2023-05-06 DIAGNOSIS — Z8249 Family history of ischemic heart disease and other diseases of the circulatory system: Secondary | ICD-10-CM | POA: Diagnosis not present

## 2023-05-06 DIAGNOSIS — I110 Hypertensive heart disease with heart failure: Secondary | ICD-10-CM | POA: Diagnosis present

## 2023-05-06 DIAGNOSIS — K089 Disorder of teeth and supporting structures, unspecified: Secondary | ICD-10-CM

## 2023-05-06 DIAGNOSIS — I5032 Chronic diastolic (congestive) heart failure: Secondary | ICD-10-CM | POA: Diagnosis present

## 2023-05-06 DIAGNOSIS — J441 Chronic obstructive pulmonary disease with (acute) exacerbation: Principal | ICD-10-CM | POA: Diagnosis present

## 2023-05-06 LAB — CBC
HCT: 42.3 % (ref 39.0–52.0)
Hemoglobin: 13.8 g/dL (ref 13.0–17.0)
MCH: 27.7 pg (ref 26.0–34.0)
MCHC: 32.6 g/dL (ref 30.0–36.0)
MCV: 84.8 fL (ref 80.0–100.0)
Platelets: 351 10*3/uL (ref 150–400)
RBC: 4.99 MIL/uL (ref 4.22–5.81)
RDW: 13.4 % (ref 11.5–15.5)
WBC: 10.3 10*3/uL (ref 4.0–10.5)
nRBC: 0 % (ref 0.0–0.2)

## 2023-05-06 LAB — BASIC METABOLIC PANEL
Anion gap: 6 (ref 5–15)
BUN: 16 mg/dL (ref 6–20)
CO2: 32 mmol/L (ref 22–32)
Calcium: 8.7 mg/dL — ABNORMAL LOW (ref 8.9–10.3)
Chloride: 101 mmol/L (ref 98–111)
Creatinine, Ser: 0.67 mg/dL (ref 0.61–1.24)
GFR, Estimated: >60 mL/min (ref >60)
Glucose, Bld: 156 mg/dL — ABNORMAL HIGH (ref 70–99)
Potassium: 3.3 mmol/L — ABNORMAL LOW (ref 3.5–5.1)
Sodium: 139 mmol/L (ref 135–145)

## 2023-05-06 LAB — CBC WITH DIFFERENTIAL/PLATELET
Abs Immature Granulocytes: 0.05 10*3/uL (ref 0.00–0.07)
Basophils Absolute: 0.1 10*3/uL (ref 0.0–0.1)
Basophils Relative: 0 %
Eosinophils Absolute: 0.9 10*3/uL — ABNORMAL HIGH (ref 0.0–0.5)
Eosinophils Relative: 7 %
HCT: 42.3 % (ref 39.0–52.0)
Hemoglobin: 14 g/dL (ref 13.0–17.0)
Immature Granulocytes: 0 %
Lymphocytes Relative: 11 %
Lymphs Abs: 1.5 10*3/uL (ref 0.7–4.0)
MCH: 28.7 pg (ref 26.0–34.0)
MCHC: 33.1 g/dL (ref 30.0–36.0)
MCV: 86.7 fL (ref 80.0–100.0)
Monocytes Absolute: 1.1 10*3/uL — ABNORMAL HIGH (ref 0.1–1.0)
Monocytes Relative: 8 %
Neutro Abs: 10.1 10*3/uL — ABNORMAL HIGH (ref 1.7–7.7)
Neutrophils Relative %: 74 %
Platelets: 320 10*3/uL (ref 150–400)
RBC: 4.88 MIL/uL (ref 4.22–5.81)
RDW: 13.4 % (ref 11.5–15.5)
WBC: 13.7 10*3/uL — ABNORMAL HIGH (ref 4.0–10.5)
nRBC: 0 % (ref 0.0–0.2)

## 2023-05-06 LAB — CREATININE, SERUM
Creatinine, Ser: 0.98 mg/dL (ref 0.61–1.24)
GFR, Estimated: >60 mL/min (ref >60)

## 2023-05-06 LAB — RESP PANEL BY RT-PCR (RSV, FLU A&B, COVID)  RVPGX2
Influenza A by PCR: NEGATIVE
Influenza B by PCR: NEGATIVE
Resp Syncytial Virus by PCR: NEGATIVE
SARS Coronavirus 2 by RT PCR: NEGATIVE

## 2023-05-06 MED ORDER — PREDNISONE 20 MG PO TABS: 40.0000 mg | ORAL_TABLET | Freq: Every day | ORAL | Status: DC

## 2023-05-06 MED ORDER — ALBUTEROL SULFATE (2.5 MG/3ML) 0.083% IN NEBU: 2.5000 mg | INHALATION_SOLUTION | RESPIRATORY_TRACT | Status: DC | PRN

## 2023-05-06 MED ORDER — BUSPIRONE HCL 5 MG PO TABS
5.0000 mg | ORAL_TABLET | Freq: Two times a day (BID) | ORAL | Status: DC
  Administered 2023-05-06 – 2023-05-07 (×2): 5 mg via ORAL
  Filled 2023-05-06 (×3): qty 1

## 2023-05-06 MED ORDER — MOMETASONE FURO-FORMOTEROL FUM 200-5 MCG/ACT IN AERO
2.0000 | INHALATION_SPRAY | Freq: Two times a day (BID) | RESPIRATORY_TRACT | Status: DC
  Administered 2023-05-06 – 2023-05-07 (×2): 2 via RESPIRATORY_TRACT
  Filled 2023-05-06: qty 8.8

## 2023-05-06 MED ORDER — MAGNESIUM SULFATE 2 GM/50ML IV SOLN
2.0000 g | Freq: Once | INTRAVENOUS | Status: AC
  Administered 2023-05-06: 2 g via INTRAVENOUS
  Filled 2023-05-06: qty 50

## 2023-05-06 MED ORDER — METHYLPREDNISOLONE SODIUM SUCC 125 MG IJ SOLR
125.0000 mg | Freq: Once | INTRAMUSCULAR | Status: AC
  Administered 2023-05-06: 125 mg via INTRAVENOUS
  Filled 2023-05-06: qty 2

## 2023-05-06 MED ORDER — ALBUTEROL SULFATE (2.5 MG/3ML) 0.083% IN NEBU
2.5000 mg | INHALATION_SOLUTION | Freq: Once | RESPIRATORY_TRACT | Status: AC
  Administered 2023-05-06: 2.5 mg via RESPIRATORY_TRACT
  Filled 2023-05-06: qty 3

## 2023-05-06 MED ORDER — ISOSORBIDE MONONITRATE ER 60 MG PO TB24
60.0000 mg | ORAL_TABLET | Freq: Every day | ORAL | Status: DC
  Administered 2023-05-06 – 2023-05-07 (×2): 60 mg via ORAL
  Filled 2023-05-06 (×2): qty 1

## 2023-05-06 MED ORDER — LOSARTAN POTASSIUM 50 MG PO TABS
100.0000 mg | ORAL_TABLET | Freq: Every day | ORAL | Status: DC
  Administered 2023-05-06 – 2023-05-07 (×2): 100 mg via ORAL
  Filled 2023-05-06: qty 4
  Filled 2023-05-06: qty 2

## 2023-05-06 MED ORDER — SODIUM CHLORIDE 0.9 % IV SOLN: INTRAVENOUS | Status: DC

## 2023-05-06 MED ORDER — METHYLPREDNISOLONE SODIUM SUCC 125 MG IJ SOLR: 125.0000 mg | Freq: Two times a day (BID) | INTRAMUSCULAR | Status: DC

## 2023-05-06 MED ORDER — PREDNISONE 10 MG PO TABS: ORAL_TABLET | ORAL | 0 refills | Status: DC

## 2023-05-06 MED ORDER — ENOXAPARIN SODIUM 40 MG/0.4ML IJ SOSY
40.0000 mg | PREFILLED_SYRINGE | INTRAMUSCULAR | Status: DC
  Administered 2023-05-06: 40 mg via SUBCUTANEOUS
  Filled 2023-05-06 (×2): qty 0.4

## 2023-05-06 MED ORDER — METHYLPREDNISOLONE SODIUM SUCC 125 MG IJ SOLR
125.0000 mg | Freq: Two times a day (BID) | INTRAMUSCULAR | Status: DC
  Administered 2023-05-07: 125 mg via INTRAVENOUS
  Filled 2023-05-06: qty 2

## 2023-05-06 MED ORDER — DOXYCYCLINE HYCLATE 100 MG PO TABS
100.0000 mg | ORAL_TABLET | Freq: Two times a day (BID) | ORAL | Status: DC
  Administered 2023-05-06 – 2023-05-07 (×2): 100 mg via ORAL
  Filled 2023-05-06 (×3): qty 1

## 2023-05-06 MED ORDER — ALBUTEROL (5 MG/ML) CONTINUOUS INHALATION SOLN: 10.0000 mg/h | INHALATION_SOLUTION | RESPIRATORY_TRACT | Status: DC

## 2023-05-06 MED ORDER — IPRATROPIUM-ALBUTEROL 0.5-2.5 (3) MG/3ML IN SOLN
3.0000 mL | Freq: Four times a day (QID) | RESPIRATORY_TRACT | Status: DC
  Administered 2023-05-06 – 2023-05-07 (×3): 3 mL via RESPIRATORY_TRACT
  Filled 2023-05-06 (×3): qty 3

## 2023-05-06 MED ORDER — ALBUTEROL SULFATE (2.5 MG/3ML) 0.083% IN NEBU
INHALATION_SOLUTION | RESPIRATORY_TRACT | Status: AC
  Administered 2023-05-06: 2.5 mg via RESPIRATORY_TRACT
  Filled 2023-05-06: qty 6

## 2023-05-06 NOTE — H&P (Signed)
History and Physical    Patient: Franklin Chavez YNW:[id number] DOB: 1961/11/05 DOA: 05/06/2023 DOS: the patient was seen and examined on 05/06/2023 PCP: Pcp, No  Patient coming from: Home  Chief Complaint:  Chief Complaint  Patient presents with   Shortness of Breath   HPI: Franklin Chavez is a 62 y.o. male with medical history significant of COPD, essential hypertension, tobacco abuse, who is currently in jail and.  Patient has breathing treatments usually within the next inhaler also at the jail.  He is however here with worsening shortness of breath, cough and wheezing.  Patient seen and evaluated in the ER.  He has received treatment in the ER including multiple rounds of DuoNeb.  He continues to have significant shortness of breath and wheezing.  Patient is also requiring up to 3 to 4 L of oxygen at the moment.  Is being admitted with acute hypoxic respiratory failure secondary to COPD exacerbation.  He has failed home regimen.  Review of Systems: As mentioned in the history of present illness. All other systems reviewed and are negative. Past Medical History:  Diagnosis Date   COPD (chronic obstructive pulmonary disease) (HCC)    Essential hypertension    GAD (generalized anxiety disorder)    Poor dentition 12/13/2018   Tobacco use disorder 12/13/2018   History reviewed. No pertinent surgical history. Social History:  reports that he has been smoking. He has never used smokeless tobacco. He reports that he does not drink alcohol and does not use drugs.  No Known Allergies  Family History  Problem Relation Age of Onset   Diabetes Mellitus II Mother    Hypertension Mother     Prior to Admission medications   Medication Sig Start Date End Date Taking? Authorizing Provider  predniSONE (DELTASONE) 10 MG tablet Take 6 tablets (60 mg total) by mouth daily with breakfast for 4 days, THEN 4 tablets (40 mg total) daily with breakfast for 4 days, THEN 2 tablets (20 mg total) daily with  breakfast for 4 days. 05/06/23 05/18/23 Yes Trifan, Kermit Balo, MD  albuterol (VENTOLIN HFA) 108 (90 Base) MCG/ACT inhaler Inhale 1-2 puffs into the lungs every 6 (six) hours as needed for wheezing or shortness of breath. 03/10/23   Marinda Elk, MD  albuterol (VENTOLIN HFA) 108 (90 Base) MCG/ACT inhaler Inhale 1-2 puffs into the lungs every 6 (six) hours as needed for wheezing or shortness of breath. 03/16/23   Mannie Stabile, PA-C  albuterol (VENTOLIN HFA) 108 (90 Base) MCG/ACT inhaler Inhale 1-2 puffs into the lungs every 6 (six) hours as needed for wheezing or shortness of breath. 04/05/23   Sloan Leiter, DO  amLODipine (NORVASC) 10 MG tablet Take 10 mg by mouth daily.    [provider]  azithromycin (ZITHROMAX) 250 MG tablet Take 1 tablet (250 mg total) by mouth daily. Take first 2 tablets together, then 1 every day until finished. 04/05/23   Sloan Leiter, DO  busPIRone (BUSPAR) 5 MG tablet Take 1 tablet (5 mg total) by mouth 2 (two) times daily. 11/07/22   Rai, Ripudeep K, MD  fluticasone-salmeterol (ADVAIR DISKUS) 250-50 MCG/ACT AEPB Inhale 1 puff into the lungs in the morning and at bedtime. 11/07/22   Rai, Delene Ruffini, MD  guaiFENesin (MUCINEX) 600 MG 12 hr tablet Take 1 tablet (600 mg total) by mouth 2 (two) times daily. 12/22/22   Albertine Grates, MD  isosorbide mononitrate (IMDUR) 60 MG 24 hr tablet Take 1 tablet (60 mg  total) by mouth daily. 12/22/22   Albertine Grates, MD  losartan (COZAAR) 100 MG tablet Take 1 tablet (100 mg total) by mouth daily. 12/22/22   Albertine Grates, MD  tiotropium (SPIRIVA HANDIHALER) 18 MCG inhalation capsule Place 1 capsule (18 mcg total) into inhaler and inhale daily. 11/07/22 03/09/23  Cathren Harsh, MD    Physical Exam: Vitals:   05/06/23 1545 05/06/23 1554 05/06/23 1700 05/06/23 1900  BP: 108/71  120/68 (!) 141/108  Pulse: 89  81 93  Resp: (!) 32  (!) 28 (!) 25  Temp:   97.9 F (36.6 C)   TempSrc:   Oral   SpO2: (!) 86% 93% 90% 93%  Weight:      Height:        Constitutional: NAD, calm, comfortable Eyes: PERRL, lids and conjunctivae normal ENMT: Mucous membranes are moist. Posterior pharynx clear of any exudate or lesions.Normal dentition.  Neck: normal, supple, no masses, no thyromegaly Respiratory: Decreased air entry bilaterally with marked expiratory wheezing, no crackles. Normal respiratory effort. No accessory muscle use.  Cardiovascular: Regular rate and rhythm, no murmurs / rubs / gallops. No extremity edema. 2+ pedal pulses. No carotid bruits.  Abdomen: no tenderness, no masses palpated. No hepatosplenomegaly. Bowel sounds positive.  Musculoskeletal: Good range of motion, no joint swelling or tenderness, Skin: no rashes, lesions, ulcers. No induration Neurologic: CN 2-12 grossly intact. Sensation intact, DTR normal. Strength 5/5 in all 4.  Psychiatric: Normal judgment and insight. Alert and oriented x 3. Normal mood  Data Reviewed:  Oxygen saturation 86% on room air, the rest of the vital signs within normal.  Potassium 3.3, glucose 156.  White count 13.7 acute acute viral screen is negative.  Chest x-ray is negative.  Assessment and Plan:  #1 acute on chronic hypoxic respiratory failure with hypoxia: Secondary to COPD exacerbation.  Patient is currently on 3 to 4 L oxygen.  Initiate treatment for COPD exacerbation and attempt to titrate off oxygen.  #2 acute exacerbation of COPD: Continue with IV steroids, nebulizer, antibiotics.  #3 essential hypertension: Continue amlodipine and Imdur  #4 tobacco abuse: Patient has not been smoking because he is in jail.  He has not smoked in a while.    Advance Care Planning:   Code Status: Full Code   Consults: None  Family Communication: No family   Severity of Illness: The appropriate patient status for this patient is INPATIENT. Inpatient status is judged to be reasonable and necessary in order to provide the required intensity of service to ensure the patient's safety. The patient's  presenting symptoms, physical exam findings, and initial radiographic and laboratory data in the context of their chronic comorbidities is felt to place them at high risk for further clinical deterioration. Furthermore, it is not anticipated that the patient will be medically stable for discharge from the hospital within 2 midnights of admission.   * I certify that at the point of admission it is my clinical judgment that the patient will require inpatient hospital care spanning beyond 2 midnights from the point of admission due to high intensity of service, high risk for further deterioration and high frequency of surveillance required.*  AuthorLonia Blood, MD 05/06/2023 7:55 PM  For on call review [email or web address].

## 2023-05-06 NOTE — Discharge Instructions (Addendum)
Franklin Chavez was treated for COPD flareup in the emergency department.  He was given IV steroids, IV magnesium, and nebulizer treatments.  He had significant improvement of his breathing after treatment.  I recommend that he continue with his breathing treatments, every 4-6 hours, for the next 2 days, then go back to his regular treatment regimen.  I also recommended increasing his prednisone, beginning tomorrow morning, on a longer taper which was prescribed.  When he completes the steroid taper, he should go back to his daily maintenance steroid medication.  Thank you, Franklin Chavez

## 2023-05-06 NOTE — ED Triage Notes (Signed)
Patient arrived from [place] in custody with reports of increased Shortness of breath that started last night.  Patient was 84% on 4L upon EMS arrival but was given 2x duo nebs with improvement to 99% on RA.  Patient has hx of COPD and emphysema.  Patient not on O2 at baseline.  Patient alert and tachypneic on arrival

## 2023-05-06 NOTE — ED Provider Notes (Signed)
[place] EMERGENCY DEPARTMENT AT [facility] Provider Note   CSN: [id number] Arrival date & time: 05/06/23  1446     History  Chief Complaint  Patient presents with   Shortness of Breath    Franklin Chavez is a 62 y.o. male with history of COPD presenting in police custody with concern for shortness of breath.  Patient reports his breathing out worse yesterday.  HPI     Home Medications Prior to Admission medications   Medication Sig Start Date End Date Taking? Authorizing Provider  predniSONE (DELTASONE) 10 MG tablet Take 6 tablets (60 mg total) by mouth daily with breakfast for 4 days, THEN 4 tablets (40 mg total) daily with breakfast for 4 days, THEN 2 tablets (20 mg total) daily with breakfast for 4 days. 05/06/23 05/18/23 Yes Bell[place]ia Ferch, Kermit Balo, MD  albuterol (VENTOLIN HFA) 108 (90 Base) MCG/ACT inhaler Inhale 1-2 puffs into the lungs every 6 (six) hours as needed for wheezing or shortness of breath. 03/10/23   Marinda Elk, MD  albuterol (VENTOLIN HFA) 108 (90 Base) MCG/ACT inhaler Inhale 1-2 puffs into the lungs every 6 (six) hours as needed for wheezing or shortness of breath. 03/16/23   Mannie Stabile, PA-C  albuterol (VENTOLIN HFA) 108 (90 Base) MCG/ACT inhaler Inhale 1-2 puffs into the lungs every 6 (six) hours as needed for wheezing or shortness of breath. 04/05/23   Sloan Leiter, DO  amLODipine (NORVASC) 10 MG tablet Take 10 mg by mouth daily.    [provider]  azithromycin (ZITHROMAX) 250 MG tablet Take 1 tablet (250 mg total) by mouth daily. Take first 2 tablets together, then 1 every day until finished. 04/05/23   Sloan Leiter, DO  busPIRone (BUSPAR) 5 MG tablet Take 1 tablet (5 mg total) by mouth 2 (two) times daily. 11/07/22   Rai, Ripudeep K, MD  fluticasone-salmeterol (ADVAIR DISKUS) 250-50 MCG/ACT AEPB Inhale 1 puff into the lungs in the morning and at bedtime. 11/07/22   Rai, Delene Ruffini, MD  guaiFENesin (MUCINEX) 600 MG 12 hr tablet  Take 1 tablet (600 mg total) by mouth 2 (two) times daily. 12/22/22   Albertine Grates, MD  isosorbide mononitrate (IMDUR) 60 MG 24 hr tablet Take 1 tablet (60 mg total) by mouth daily. 12/22/22   Albertine Grates, MD  losartan (COZAAR) 100 MG tablet Take 1 tablet (100 mg total) by mouth daily. 12/22/22   Albertine Grates, MD  tiotropium (SPIRIVA HANDIHALER) 18 MCG inhalation capsule Place 1 capsule (18 mcg total) into inhaler and inhale daily. 11/07/22 03/09/23  Cathren Harsh, MD      Allergies    Patient has no known allergies.    Review of Systems   Review of Systems  Physical Exam Updated Vital Signs BP (!) 141/108   Pulse 93   Temp 97.9 F (36.6 C) (Oral)   Resp (!) 25   Ht 6' (1.829 m)   Wt 77.1 kg   SpO2 93%   BMI 23.06 kg/m  Physical Exam Constitutional:      General: He is not in acute distress. HENT:     Head: Normocephalic and atraumatic.  Eyes:     Conjunctiva/sclera: Conjunctivae normal.     Pupils: Pupils are equal, round, and reactive to light.  Cardiovascular:     Rate and Rhythm: Normal rate and regular rhythm.  Pulmonary:     Effort: Pulmonary effort is normal. No respiratory distress.     Comments: Diffuse expiratory  wheezing bilaterally Abdominal:     General: There is no distension.     Tenderness: There is no abdominal tenderness.  Skin:    General: Skin is warm and dry.  Neurological:     General: No focal deficit present.     Mental Status: He is alert. Mental status is at baseline.  Psychiatric:        Mood and Affect: Mood normal.        Behavior: Behavior normal.     ED Results / Procedures / Treatments   Labs (all labs ordered are listed, but only abnormal results are displayed) Labs Reviewed  BASIC METABOLIC PANEL - Abnormal; Notable for the following components:      Result Value   Potassium 3.3 (*)    Glucose, Bld 156 (*)    Calcium 8.7 (*)    All other components within normal limits  CBC WITH DIFFERENTIAL/PLATELET - Abnormal; Notable for the following  components:   WBC 13.7 (*)    Neutro Abs 10.1 (*)    Monocytes Absolute 1.1 (*)    Eosinophils Absolute 0.9 (*)    All other components within normal limits  RESP PANEL BY RT-PCR (RSV, FLU A&B, COVID)  [id number]    EKG None  Radiology DG Chest Port 1 View  Result Date: 05/06/2023 CLINICAL DATA:  Shortness of breath.  Hypoxia. EXAM: PORTABLE CHEST 1 VIEW COMPARISON:  04/05/2023 FINDINGS: The heart size and mediastinal contours are within normal limits. Both lungs are clear. The visualized skeletal structures are unremarkable. IMPRESSION: Negative one view chest x-ray Electronically Signed   By: Marin Roberts M.D.   On: 05/06/2023 16:21    Procedures Procedures    Medications Ordered in ED Medications  magnesium sulfate IVPB 2 g 50 mL (0 g Intravenous Stopped 05/06/23 1907)  methylPREDNISolone sodium succinate (SOLU-MEDROL) 125 mg/2 mL injection 125 mg (125 mg Intravenous Given 05/06/23 1606)  albuterol (PROVENTIL) (2.5 MG/3ML) 0.083% nebulizer solution 2.5 mg (2.5 mg Nebulization Given 05/06/23 1641)    ED Course/ Medical Decision Making/ A&P Clinical Course as of 05/06/23 1940  Sun May 06, 2023  1853 Significant improvement of the patient's breathing and my reassessment.  He has access to every 6 breathing treatments at the present, per patient and card report.  His oxygen saturation is stable and near 90% on room air which I suspect is his baseline level.  He is feeling much better and okay for discharge, will need to continue to 6 treatments as well as an increase in his steroid medication regimen. [MT]  1939 At time of attempted discharge patient noted to have significant desaturation with O2 sat down to 84%, will try to get out of the bed.  He is placed back on oxygen.  Given his new oxygen requirement, we will admit him. [MT]    Clinical Course User Index [MT] Makayela Secrest, Kermit Balo, MD                             Medical Decision Making Amount and/or Complexity of Data  Reviewed Labs: ordered. Radiology: ordered.  Risk Prescription drug management. Decision regarding hospitalization.   This patient presents to the ED with concern for shortness of breath, wheezing. This involves an extensive number of treatment options, and is a complaint that carries with it a high risk of complications and morbidity.  The differential diagnosis includes exacerbation versus pneumonia versus pleural effusion versus pneumothorax versus other  Co-morbidities that complicate the patient evaluation: History of longstanding COPD at high risk of exacerbation   I ordered and personally interpreted labs.  The pertinent results include: Mild leukocytosis in the setting of chronic steroid use.  Mild hypokalemia.  COVID and flu are negative  I ordered imaging studies including x-ray of the chest I independently visualized and interpreted imaging which showed no focal infiltrate I agree with the radiologist interpretation  The patient was maintained on a cardiac monitor.  I personally viewed and interpreted the cardiac monitored which showed an underlying rhythm of: Sinus rhythm I ordered medication including IV steroids, IV magnesium, nebulizers for COPD exacerbation  I have reviewed the patients home medicines and have made adjustments as needed  Test Considered: Doubt acute PE clinically  After the interventions noted above, I reevaluated the patient and found that they have: stayed the same  Wheezing improved, patient remains hypoxic with a new oxygen requirement.  Therefore he will need to be admitted.   Dispostion:  After consideration of the diagnostic results and the patients response to treatment, I feel that the patent would benefit from medical admission         Final Clinical Impression(s) / ED Diagnoses Final diagnoses:  COPD exacerbation (HCC)    Rx / DC Orders ED Discharge Orders          Ordered    predniSONE (DELTASONE) 10 MG tablet  Q  breakfast        05/06/23 1855              Terald Sleeper, MD 05/06/23 1940

## 2023-05-06 NOTE — ED Notes (Signed)
Pt placed on 4L [place] due to low O2

## 2023-05-07 DIAGNOSIS — J9621 Acute and chronic respiratory failure with hypoxia: Secondary | ICD-10-CM | POA: Diagnosis not present

## 2023-05-07 DIAGNOSIS — E876 Hypokalemia: Secondary | ICD-10-CM | POA: Diagnosis not present

## 2023-05-07 DIAGNOSIS — J441 Chronic obstructive pulmonary disease with (acute) exacerbation: Secondary | ICD-10-CM | POA: Diagnosis not present

## 2023-05-07 LAB — CBC
HCT: 36.1 % — ABNORMAL LOW (ref 39.0–52.0)
Hemoglobin: 12.2 g/dL — ABNORMAL LOW (ref 13.0–17.0)
MCH: 28.8 pg (ref 26.0–34.0)
MCHC: 33.8 g/dL (ref 30.0–36.0)
MCV: 85.3 fL (ref 80.0–100.0)
Platelets: 299 10*3/uL (ref 150–400)
RBC: 4.23 MIL/uL (ref 4.22–5.81)
RDW: 13.6 % (ref 11.5–15.5)
WBC: 9.8 10*3/uL (ref 4.0–10.5)
nRBC: 0 % (ref 0.0–0.2)

## 2023-05-07 LAB — COMPREHENSIVE METABOLIC PANEL
ALT: 14 U/L (ref 0–44)
AST: 20 U/L (ref 15–41)
Albumin: 3.4 g/dL — ABNORMAL LOW (ref 3.5–5.0)
Alkaline Phosphatase: 33 U/L — ABNORMAL LOW (ref 38–126)
Anion gap: 10 (ref 5–15)
BUN: 14 mg/dL (ref 6–20)
CO2: 25 mmol/L (ref 22–32)
Calcium: 8.5 mg/dL — ABNORMAL LOW (ref 8.9–10.3)
Chloride: 100 mmol/L (ref 98–111)
Creatinine, Ser: 0.85 mg/dL (ref 0.61–1.24)
GFR, Estimated: >60 mL/min (ref >60)
Glucose, Bld: 178 mg/dL — ABNORMAL HIGH (ref 70–99)
Potassium: 4 mmol/L (ref 3.5–5.1)
Sodium: 135 mmol/L (ref 135–145)
Total Bilirubin: 0.6 mg/dL (ref 0.3–1.2)
Total Protein: 6.2 g/dL — ABNORMAL LOW (ref 6.5–8.1)

## 2023-05-07 LAB — HIV ANTIBODY (ROUTINE TESTING W REFLEX): HIV Screen 4th Generation wRfx: NONREACTIVE

## 2023-05-07 MED ORDER — ACETAMINOPHEN 325 MG PO TABS
650.0000 mg | ORAL_TABLET | Freq: Four times a day (QID) | ORAL | Status: DC | PRN
  Administered 2023-05-07: 650 mg via ORAL
  Filled 2023-05-07: qty 2

## 2023-05-07 MED ORDER — PREDNISONE 10 MG PO TABS: ORAL_TABLET | ORAL | 0 refills | Status: DC

## 2023-05-07 MED ORDER — DOXYCYCLINE HYCLATE 100 MG PO TABS: 100.0000 mg | ORAL_TABLET | Freq: Two times a day (BID) | ORAL | 0 refills | Status: AC

## 2023-05-07 MED ORDER — GUAIFENESIN-DM 100-10 MG/5ML PO SYRP: 5.0000 mL | ORAL_SOLUTION | ORAL | Status: DC | PRN

## 2023-05-07 MED ORDER — IPRATROPIUM-ALBUTEROL 0.5-2.5 (3) MG/3ML IN SOLN: 3.0000 mL | Freq: Two times a day (BID) | RESPIRATORY_TRACT | Status: DC

## 2023-05-07 NOTE — Progress Notes (Signed)
Patient discharged back to jail.  AVS given to guard and report called to medical.  Updated on meds and recommendations for follow up and bloodwork.  Patient in NAD at this time.

## 2023-05-07 NOTE — Progress Notes (Signed)
[facility] PROGRESS NOTE    Progress Note  Franklin Chavez  ZOX:[id number] DOB: 02-09-1961 DOA: 05/06/2023 PCP: Oneita Hurt, No     Brief Narrative:   Franklin Chavez is an 62 y.o. male past medical history significant for COPD, essential hypertension who is currently in jail comes in with worsening shortness of breath and wheezing received multiple inhaler treatments in the ER with no improvement had to put put on 4 L of oxygen  Assessment/Plan:   Acute on chronic respiratory failure with hypoxemia secondary to COPD with acute exacerbation: Continue oxygen try to wean to room air. Continue incentive spirometry, out of bed to chair. Try to wean to room air. Change steroids to orals. Continue steroids antibiotics and inhalers.  Hypokalemia: Likely due to inhalers repleted orally now improved  Poor dentition Noted  Essential HTN (hypertension) Continue amlodipine and Imdur  Chronic diastolic CHF (congestive heart failure) (HCC) Continue Imdur, and losartan. Blood pressure is controlled.   DVT prophylaxis: lovenox Family Communication:none Status is: Inpatient Remains inpatient appropriate because: Acute respiratory failure with hypoxia    Code Status:     Code Status Orders  (From admission, onward)           Start     Ordered   05/06/23 1954  Full code  Continuous       Question:  By:  Answer:  Consent: discussion documented in EHR   05/06/23 1955           Code Status History     Date Active Date Inactive Code Status Order ID Comments User Context   03/08/2023 2128 03/10/2023 1641 Full Code [id number]  Rometta Emery, MD ED   01/27/2023 2345 02/01/2023 1730 Full Code [id number]  Anselm Jungling, DO ED   01/09/2023 1441 01/11/2023 1604 Full Code [id number]  Teddy Spike, DO ED   12/28/2022 2035 12/30/2022 1925 Full Code [id number]  Gery Pray, MD ED   12/19/2022 2108 12/22/2022 2216 Full Code [id number]  Howerter, Chaney Born, DO ED   10/30/2022 1610 11/07/2022 1955  Full Code [id number]  Almon Hercules, MD ED   10/23/2022 0901 10/26/2022 1904 Full Code [id number]  Bobette Mo, MD ED   12/10/2018 1716 12/14/2018 1735 Full Code [id number]  Dominica Severin, MD ED         IV Access:   Peripheral IV   Procedures and diagnostic studies:   DG Chest Port 1 View  Result Date: 05/06/2023 CLINICAL DATA:  Shortness of breath.  Hypoxia. EXAM: PORTABLE CHEST 1 VIEW COMPARISON:  04/05/2023 FINDINGS: The heart size and mediastinal contours are within normal limits. Both lungs are clear. The visualized skeletal structures are unremarkable. IMPRESSION: Negative one view chest x-ray Electronically Signed   By: Marin Roberts M.D.   On: 05/06/2023 16:21     Medical Consultants:   None.   Subjective:    Franklin Chavez is better.  Objective:    Vitals:   05/06/23 2237 05/07/23 0237 05/07/23 0250 05/07/23 0516  BP: 126/67 129/81  132/81  Pulse: 100 79  89  Resp: (!) 22 (!) 22  (!) 21  Temp: 98.8 F (37.1 C) 98.5 F (36.9 C)  98.5 F (36.9 C)  TempSrc: Oral Oral  Oral  SpO2: 91% 100% 96% 93%  Weight: 71.4 kg     Height: 6' (1.829 m)      SpO2: 93 % O2 Flow Rate (L/min): 2 L/min   Intake/Output Summary (Last 24  hours) at 05/07/2023 0645 Last data filed at 05/07/2023 0400 Gross per 24 hour  Intake 1313.38 ml  Output 350 ml  Net 963.38 ml   Filed Weights   05/06/23 1508 05/06/23 2237  Weight: 77.1 kg 71.4 kg    Exam: General exam: In no acute distress. Respiratory system: Good air movement and clear to auscultation. Cardiovascular system: S1 & S2 heard, RRR. No JVD. Gastrointestinal system: Abdomen is nondistended, soft and nontender.  Extremities: No pedal edema. Skin: No rashes, lesions or ulcers Psychiatry: Judgement and insight appear normal. Mood & affect appropriate.    Data Reviewed:    Labs: Basic Metabolic Panel: Recent Labs  Lab 05/06/23 1613 05/06/23 2116 05/07/23 0443  NA 139  --  135   K 3.3*  --  4.0  CL 101  --  100  CO2 32  --  25  GLUCOSE 156*  --  178*  BUN 16  --  14  CREATININE 0.67 0.98 0.85  CALCIUM 8.7*  --  8.5*   GFR Estimated Creatinine Clearance: 93.3 mL/min (by C-G formula based on SCr of 0.85 mg/dL). Liver Function Tests: Recent Labs  Lab 05/07/23 0443  AST 20  ALT 14  ALKPHOS 33*  BILITOT 0.6  PROT 6.2*  ALBUMIN 3.4*   No results for input(s): "LIPASE", "AMYLASE" in the last 168 hours. No results for input(s): "AMMONIA" in the last 168 hours. Coagulation profile No results for input(s): "INR", "PROTIME" in the last 168 hours. COVID-19 Labs  No results for input(s): "DDIMER", "FERRITIN", "LDH", "CRP" in the last 72 hours.  Lab Results  Component Value Date   SARSCOV2NAA NEGATIVE 05/06/2023   SARSCOV2NAA NEGATIVE 03/16/2023   SARSCOV2NAA NEGATIVE 02/23/2023   SARSCOV2NAA NEGATIVE 02/19/2023    CBC: Recent Labs  Lab 05/06/23 1613 05/06/23 2116 05/07/23 0443  WBC 13.7* 10.3 9.8  NEUTROABS 10.1*  --   --   HGB 14.0 13.8 12.2*  HCT 42.3 42.3 36.1*  MCV 86.7 84.8 85.3  PLT 320 351 299   Cardiac Enzymes: No results for input(s): "CKTOTAL", "CKMB", "CKMBINDEX", "TROPONINI" in the last 168 hours. BNP (last 3 results) No results for input(s): "PROBNP" in the last 8760 hours. CBG: No results for input(s): "GLUCAP" in the last 168 hours. D-Dimer: No results for input(s): "DDIMER" in the last 72 hours. Hgb A1c: No results for input(s): "HGBA1C" in the last 72 hours. Lipid Profile: No results for input(s): "CHOL", "HDL", "LDLCALC", "TRIG", "CHOLHDL", "LDLDIRECT" in the last 72 hours. Thyroid function studies: No results for input(s): "TSH", "T4TOTAL", "T3FREE", "THYROIDAB" in the last 72 hours.  Invalid input(s): "FREET3" Anemia work up: No results for input(s): "VITAMINB12", "FOLATE", "FERRITIN", "TIBC", "IRON", "RETICCTPCT" in the last 72 hours. Sepsis Labs: Recent Labs  Lab 05/06/23 1613 05/06/23 2116 05/07/23 0443   WBC 13.7* 10.3 9.8   Microbiology Recent Results (from the past 240 hour(s))  Resp panel by RT-PCR (RSV, Flu A&B, Covid) Anterior Nasal Swab     Status: None   Collection Time: 05/06/23  4:18 PM   Specimen: Anterior Nasal Swab  Result Value Ref Range Status   SARS Coronavirus 2 by RT PCR NEGATIVE NEGATIVE Final    Comment: (NOTE) SARS-CoV-2 target nucleic acids are NOT DETECTED.  The SARS-CoV-2 RNA is generally detectable in upper respiratory specimens during the acute phase of infection. The lowest concentration of SARS-CoV-2 viral copies this assay can detect is 138 copies/mL. A negative result does not preclude SARS-Cov-2 infection and should not be used  as the sole basis for treatment or other patient management decisions. A negative result may occur with  improper specimen collection/handling, submission of specimen other than nasopharyngeal swab, presence of viral mutation(s) within the areas targeted by this assay, and inadequate number of viral copies(<138 copies/mL). A negative result must be combined with clinical observations, patient history, and epidemiological information. The expected result is Negative.  Fact Sheet for Patients:  BloggerCourse.com  Fact Sheet for Healthcare Providers:  SeriousBroker.it  This test is no t yet approved or cleared by the [place] FDA and  has been authorized for detection and/or diagnosis of SARS-CoV-2 by FDA under an Emergency Use Authorization (EUA). This EUA will remain  in effect (meaning this test can be used) for the duration of the COVID-19 declaration under Section 564(b)(1) of the Act, 21 U.S.C.section 360bbb-3(b)(1), unless the authorization is terminated  or revoked sooner.       Influenza A by PCR NEGATIVE NEGATIVE Final   Influenza B by PCR NEGATIVE NEGATIVE Final    Comment: (NOTE) The Xpert Xpress SARS-CoV-2/FLU/RSV plus assay is intended as an aid in the  diagnosis of influenza from Nasopharyngeal swab specimens and should not be used as a sole basis for treatment. Nasal washings and aspirates are unacceptable for Xpert Xpress SARS-CoV-2/FLU/RSV testing.  Fact Sheet for Patients: BloggerCourse.com  Fact Sheet for Healthcare Providers: SeriousBroker.it  This test is not yet approved or cleared by the [place] FDA and has been authorized for detection and/or diagnosis of SARS-CoV-2 by FDA under an Emergency Use Authorization (EUA). This EUA will remain in effect (meaning this test can be used) for the duration of the COVID-19 declaration under Section 564(b)(1) of the Act, 21 U.S.C. section 360bbb-3(b)(1), unless the authorization is terminated or revoked.     Resp Syncytial Virus by PCR NEGATIVE NEGATIVE Final    Comment: (NOTE) Fact Sheet for Patients: BloggerCourse.com  Fact Sheet for Healthcare Providers: SeriousBroker.it  This test is not yet approved or cleared by the [place] FDA and has been authorized for detection and/or diagnosis of SARS-CoV-2 by FDA under an Emergency Use Authorization (EUA). This EUA will remain in effect (meaning this test can be used) for the duration of the COVID-19 declaration under Section 564(b)(1) of the Act, 21 U.S.C. section 360bbb-3(b)(1), unless the authorization is terminated or revoked.  Performed at [facility], [address] [place], [place] [address]      Medications:    busPIRone  5 mg Oral BID   doxycycline  100 mg Oral Q12H   enoxaparin (LOVENOX) injection  40 mg Subcutaneous Q24H   ipratropium-albuterol  3 mL Nebulization Q6H   isosorbide mononitrate  60 mg Oral Daily   losartan  100 mg Oral Daily   methylPREDNISolone (SOLU-MEDROL) injection  125 mg Intravenous Q12H   Followed by   Melene Muller ON 05/08/2023] predniSONE  40 mg Oral Q breakfast    mometasone-formoterol  2 puff Inhalation BID   Continuous Infusions:  sodium chloride 75 mL/hr at 05/06/23 2246      LOS: 1 day   Marinda Elk  [facility]  05/07/2023, 6:45 AM

## 2023-05-07 NOTE — Discharge Summary (Signed)
Physician Discharge Summary  Franklin Chavez ZOX:[id number] DOB: 10-26-1961 DOA: 05/06/2023  PCP: Oneita Hurt, No  Admit date: 05/06/2023 Discharge date: 05/07/2023  Admitted From: Incarceration Disposition:  Jail  Recommendations for Outpatient Follow-up:  Follow up with PCP in 1-2 weeks Please obtain BMP/CBC in one week   Home Health:No Equipment/Devices:none  Discharge Condition:Stable CODE STATUS:Full Diet recommendation: Heart Healthy   Brief/Interim Summary: 62 y.o. male past medical history significant for COPD, essential hypertension who is currently in jail comes in with worsening shortness of breath and wheezing received multiple inhaler treatments in the ER with no improvement had to put put on 4 L of oxygen   Discharge Diagnoses:  Principal Problem:   Acute on chronic respiratory failure with hypoxemia (HCC) Active Problems:   COPD with acute exacerbation (HCC)   Hypokalemia   Poor dentition   HTN (hypertension)   Chronic diastolic CHF (congestive heart failure) (HCC)  Acute respiratory failure with hypoxia secondary to COPD exacerbation: Will start on oxygen on admission IV steroids inhalers and antibiotics. By the next day we were able to wean to room air. He will continue steroids for 5 additional days taper as an outpatient. Will continue antibiotics for 5 days.  Hypokalemia: Likely due to inhalers repeated now improved.  Poor dentition: Noted.  Essential hypertension: Continue amlodipine and Imdur.  Chronic diastolic heart failure: Continue Imdur and losartan blood pressure is stable.  Discharge Instructions  Discharge Instructions     Diet - low sodium heart healthy   Complete by: As directed    Increase activity slowly   Complete by: As directed       Allergies as of 05/07/2023   No Known Allergies      Medication List     STOP taking these medications    azithromycin 250 MG tablet Commonly known as: ZITHROMAX       TAKE these  medications    albuterol 108 (90 Base) MCG/ACT inhaler Commonly known as: VENTOLIN HFA Inhale 1-2 puffs into the lungs every 6 (six) hours as needed for wheezing or shortness of breath.   albuterol 108 (90 Base) MCG/ACT inhaler Commonly known as: VENTOLIN HFA Inhale 1-2 puffs into the lungs every 6 (six) hours as needed for wheezing or shortness of breath.   amLODipine 10 MG tablet Commonly known as: NORVASC Take 10 mg by mouth daily.   busPIRone 5 MG tablet Commonly known as: BUSPAR Take 1 tablet (5 mg total) by mouth 2 (two) times daily.   doxycycline 100 MG tablet Commonly known as: VIBRA-TABS Take 1 tablet (100 mg total) by mouth every 12 (twelve) hours for 7 days.   fluticasone-salmeterol 250-50 MCG/ACT Aepb Commonly known as: Advair Diskus Inhale 1 puff into the lungs in the morning and at bedtime.   guaiFENesin 600 MG 12 hr tablet Commonly known as: MUCINEX Take 1 tablet (600 mg total) by mouth 2 (two) times daily.   ipratropium-albuterol 0.5-2.5 (3) MG/3ML Soln Commonly known as: DUONEB Take 3 mLs by nebulization every 6 (six) hours as needed.   isosorbide mononitrate 60 MG 24 hr tablet Commonly known as: IMDUR Take 1 tablet (60 mg total) by mouth daily.   losartan 100 MG tablet Commonly known as: COZAAR Take 1 tablet (100 mg total) by mouth daily.   predniSONE 10 MG tablet Commonly known as: DELTASONE Takes 6 tablets for 1 days, then 5 tablets for 1 days, then 4 tablets for 1 days, then 3 tablets for 1 days, then 2 tabs for  1 days, then 1 tab for 1 days, and then stop. What changed:  medication strength how much to take how to take this when to take this additional instructions   tiotropium 18 MCG inhalation capsule Commonly known as: Spiriva HandiHaler Place 1 capsule (18 mcg total) into inhaler and inhale daily.   Trelegy Ellipta 100-62.5-25 MCG/ACT Aepb Generic drug: Fluticasone-Umeclidin-Vilant Inhale 1 puff into the lungs daily.         Follow-up Information     Go to  [facility] Emergency Department at [facility].   Specialty: Emergency Medicine Why: If symptoms worsen Contact information: [id number] [place] [address] [phone]               No Known Allergies  Consultations: None   Procedures/Studies: DG Chest Port 1 View  Result Date: 05/06/2023 CLINICAL DATA:  Shortness of breath.  Hypoxia. EXAM: PORTABLE CHEST 1 VIEW COMPARISON:  04/05/2023 FINDINGS: The heart size and mediastinal contours are within normal limits. Both lungs are clear. The visualized skeletal structures are unremarkable. IMPRESSION: Negative one view chest x-ray Electronically Signed   By: Marin Roberts M.D.   On: 05/06/2023 16:21   (Echo, Carotid, EGD, Colonoscopy, ERCP)    Subjective: No complaints breathing is better.  Discharge Exam: Vitals:   05/07/23 0717 05/07/23 0821  BP:    Pulse:    Resp:    Temp:    SpO2: 91% 95%   Vitals:   05/07/23 0250 05/07/23 0516 05/07/23 0717 05/07/23 0821  BP:  132/81    Pulse:  89    Resp:  (!) 21    Temp:  98.5 F (36.9 C)    TempSrc:  Oral    SpO2: 96% 93% 91% 95%  Weight:      Height:        General: Pt is alert, awake, not in acute distress Cardiovascular: RRR, S1/S2 +, no rubs, no gallops Respiratory: CTA bilaterally, no wheezing, no rhonchi Abdominal: Soft, NT, ND, bowel sounds + Extremities: no edema, no cyanosis    The results of significant diagnostics from this hospitalization (including imaging, microbiology, ancillary and laboratory) are listed below for reference.     Microbiology: Recent Results (from the past 240 hour(s))  Resp panel by RT-PCR (RSV, Flu A&B, Covid) Anterior Nasal Swab     Status: None   Collection Time: 05/06/23  4:18 PM   Specimen: Anterior Nasal Swab  Result Value Ref Range Status   SARS Coronavirus 2 by RT PCR NEGATIVE NEGATIVE Final    Comment: (NOTE) SARS-CoV-2 target  nucleic acids are NOT DETECTED.  The SARS-CoV-2 RNA is generally detectable in upper respiratory specimens during the acute phase of infection. The lowest concentration of SARS-CoV-2 viral copies this assay can detect is 138 copies/mL. A negative result does not preclude SARS-Cov-2 infection and should not be used as the sole basis for treatment or other patient management decisions. A negative result may occur with  improper specimen collection/handling, submission of specimen other than nasopharyngeal swab, presence of viral mutation(s) within the areas targeted by this assay, and inadequate number of viral copies(<138 copies/mL). A negative result must be combined with clinical observations, patient history, and epidemiological information. The expected result is Negative.  Fact Sheet for Patients:  BloggerCourse.com  Fact Sheet for Healthcare Providers:  SeriousBroker.it  This test is no t yet approved or cleared by the [place] FDA and  has been authorized for detection and/or diagnosis of  SARS-CoV-2 by FDA under an Emergency Use Authorization (EUA). This EUA will remain  in effect (meaning this test can be used) for the duration of the COVID-19 declaration under Section 564(b)(1) of the Act, 21 U.S.C.section 360bbb-3(b)(1), unless the authorization is terminated  or revoked sooner.       Influenza A by PCR NEGATIVE NEGATIVE Final   Influenza B by PCR NEGATIVE NEGATIVE Final    Comment: (NOTE) The Xpert Xpress SARS-CoV-2/FLU/RSV plus assay is intended as an aid in the diagnosis of influenza from Nasopharyngeal swab specimens and should not be used as a sole basis for treatment. Nasal washings and aspirates are unacceptable for Xpert Xpress SARS-CoV-2/FLU/RSV testing.  Fact Sheet for Patients: BloggerCourse.com  Fact Sheet for Healthcare  Providers: SeriousBroker.it  This test is not yet approved or cleared by the [place] FDA and has been authorized for detection and/or diagnosis of SARS-CoV-2 by FDA under an Emergency Use Authorization (EUA). This EUA will remain in effect (meaning this test can be used) for the duration of the COVID-19 declaration under Section 564(b)(1) of the Act, 21 U.S.C. section 360bbb-3(b)(1), unless the authorization is terminated or revoked.     Resp Syncytial Virus by PCR NEGATIVE NEGATIVE Final    Comment: (NOTE) Fact Sheet for Patients: BloggerCourse.com  Fact Sheet for Healthcare Providers: SeriousBroker.it  This test is not yet approved or cleared by the [place] FDA and has been authorized for detection and/or diagnosis of SARS-CoV-2 by FDA under an Emergency Use Authorization (EUA). This EUA will remain in effect (meaning this test can be used) for the duration of the COVID-19 declaration under Section 564(b)(1) of the Act, 21 U.S.C. section 360bbb-3(b)(1), unless the authorization is terminated or revoked.  Performed at [facility], [address], [place], [place] [address]      Labs: BNP (last 3 results) Recent Labs    01/27/23 2056 02/19/23 2143 04/05/23 0048  BNP 19.5 14.9 52.5   Basic Metabolic Panel: Recent Labs  Lab 05/06/23 1613 05/06/23 2116 05/07/23 0443  NA 139  --  135  K 3.3*  --  4.0  CL 101  --  100  CO2 32  --  25  GLUCOSE 156*  --  178*  BUN 16  --  14  CREATININE 0.67 0.98 0.85  CALCIUM 8.7*  --  8.5*   Liver Function Tests: Recent Labs  Lab 05/07/23 0443  AST 20  ALT 14  ALKPHOS 33*  BILITOT 0.6  PROT 6.2*  ALBUMIN 3.4*   No results for input(s): "LIPASE", "AMYLASE" in the last 168 hours. No results for input(s): "AMMONIA" in the last 168 hours. CBC: Recent Labs  Lab 05/06/23 1613 05/06/23 2116 05/07/23 0443  WBC 13.7*  10.3 9.8  NEUTROABS 10.1*  --   --   HGB 14.0 13.8 12.2*  HCT 42.3 42.3 36.1*  MCV 86.7 84.8 85.3  PLT 320 351 299   Cardiac Enzymes: No results for input(s): "CKTOTAL", "CKMB", "CKMBINDEX", "TROPONINI" in the last 168 hours. BNP: Invalid input(s): "POCBNP" CBG: No results for input(s): "GLUCAP" in the last 168 hours. D-Dimer No results for input(s): "DDIMER" in the last 72 hours. Hgb A1c No results for input(s): "HGBA1C" in the last 72 hours. Lipid Profile No results for input(s): "CHOL", "HDL", "LDLCALC", "TRIG", "CHOLHDL", "LDLDIRECT" in the last 72 hours. Thyroid function studies No results for input(s): "TSH", "T4TOTAL", "T3FREE", "THYROIDAB" in the last 72 hours.  Invalid input(s): "FREET3" Anemia work up No results for  input(s): "VITAMINB12", "FOLATE", "FERRITIN", "TIBC", "IRON", "RETICCTPCT" in the last 72 hours. Urinalysis    Component Value Date/Time   COLORURINE YELLOW 12/20/2022 0404   APPEARANCEUR HAZY (A) 12/20/2022 0404   LABSPEC 1.015 12/20/2022 0404   PHURINE 5.0 12/20/2022 0404   GLUCOSEU >=500 (A) 12/20/2022 0404   HGBUR NEGATIVE 12/20/2022 0404   BILIRUBINUR NEGATIVE 12/20/2022 0404   KETONESUR NEGATIVE 12/20/2022 0404   PROTEINUR NEGATIVE 12/20/2022 0404   NITRITE NEGATIVE 12/20/2022 0404   LEUKOCYTESUR NEGATIVE 12/20/2022 0404   Sepsis Labs Recent Labs  Lab 05/06/23 1613 05/06/23 2116 05/07/23 0443  WBC 13.7* 10.3 9.8   Microbiology Recent Results (from the past 240 hour(s))  Resp panel by RT-PCR (RSV, Flu A&B, Covid) Anterior Nasal Swab     Status: None   Collection Time: 05/06/23  4:18 PM   Specimen: Anterior Nasal Swab  Result Value Ref Range Status   SARS Coronavirus 2 by RT PCR NEGATIVE NEGATIVE Final    Comment: (NOTE) SARS-CoV-2 target nucleic acids are NOT DETECTED.  The SARS-CoV-2 RNA is generally detectable in upper respiratory specimens during the acute phase of infection. The lowest concentration of SARS-CoV-2 viral copies  this assay can detect is 138 copies/mL. A negative result does not preclude SARS-Cov-2 infection and should not be used as the sole basis for treatment or other patient management decisions. A negative result may occur with  improper specimen collection/handling, submission of specimen other than nasopharyngeal swab, presence of viral mutation(s) within the areas targeted by this assay, and inadequate number of viral copies(<138 copies/mL). A negative result must be combined with clinical observations, patient history, and epidemiological information. The expected result is Negative.  Fact Sheet for Patients:  BloggerCourse.com  Fact Sheet for Healthcare Providers:  SeriousBroker.it  This test is no t yet approved or cleared by the [place] FDA and  has been authorized for detection and/or diagnosis of SARS-CoV-2 by FDA under an Emergency Use Authorization (EUA). This EUA will remain  in effect (meaning this test can be used) for the duration of the COVID-19 declaration under Section 564(b)(1) of the Act, 21 U.S.C.section 360bbb-3(b)(1), unless the authorization is terminated  or revoked sooner.       Influenza A by PCR NEGATIVE NEGATIVE Final   Influenza B by PCR NEGATIVE NEGATIVE Final    Comment: (NOTE) The Xpert Xpress SARS-CoV-2/FLU/RSV plus assay is intended as an aid in the diagnosis of influenza from Nasopharyngeal swab specimens and should not be used as a sole basis for treatment. Nasal washings and aspirates are unacceptable for Xpert Xpress SARS-CoV-2/FLU/RSV testing.  Fact Sheet for Patients: BloggerCourse.com  Fact Sheet for Healthcare Providers: SeriousBroker.it  This test is not yet approved or cleared by the [place] FDA and has been authorized for detection and/or diagnosis of SARS-CoV-2 by FDA under an Emergency Use Authorization (EUA). This EUA  will remain in effect (meaning this test can be used) for the duration of the COVID-19 declaration under Section 564(b)(1) of the Act, 21 U.S.C. section 360bbb-3(b)(1), unless the authorization is terminated or revoked.     Resp Syncytial Virus by PCR NEGATIVE NEGATIVE Final    Comment: (NOTE) Fact Sheet for Patients: BloggerCourse.com  Fact Sheet for Healthcare Providers: SeriousBroker.it  This test is not yet approved or cleared by the [place] FDA and has been authorized for detection and/or diagnosis of SARS-CoV-2 by FDA under an Emergency Use Authorization (EUA). This EUA will remain in effect (meaning this test can be used) for the  duration of the COVID-19 declaration under Section 564(b)(1) of the Act, 21 U.S.C. section 360bbb-3(b)(1), unless the authorization is terminated or revoked.  Performed at [facility], [address], [place], [place] [address]      SIGNED:   Marinda Elk, MD  [facility] 05/07/2023, 9:47 AM Pager   If 7PM-7AM, please contact night-coverage [email or web address] Password [email or web address]

## 2023-05-07 NOTE — Progress Notes (Signed)
PT Cancellation Note  Patient Details Name: Franklin Chavez MRN: [id number] DOB: 02/10/1961   Cancelled Treatment:    Reason Eval/Treat Not Completed:  Pt set to d/c. Will sign off.    Faye Ramsay, PT Acute Rehabilitation  Office: [phone]

## 2023-05-07 NOTE — Progress Notes (Signed)
OT Cancellation Note  Patient Details Name: Franklin Chavez MRN: [id number] DOB: 06/18/1961   Cancelled Treatment:    Reason Eval/Treat Not Completed: Other (comment) Patient in room with nurse completing d/c at this time. Patient to transition back to jail.  Rosalio Loud, MS Acute Rehabilitation Department Office# [phone]  05/07/2023, 10:47 AM

## 2023-05-26 ENCOUNTER — Emergency Department (HOSPITAL_COMMUNITY)

## 2023-05-26 ENCOUNTER — Other Ambulatory Visit: Payer: Self-pay

## 2023-05-26 ENCOUNTER — Emergency Department (HOSPITAL_COMMUNITY)
Admission: EM | Admit: 2023-05-26 | Discharge: 2023-05-26 | Disposition: A | Discharge status: Home / Self Care | Attending: Emergency Medicine | Admitting: Emergency Medicine

## 2023-05-26 DIAGNOSIS — J441 Chronic obstructive pulmonary disease with (acute) exacerbation: Secondary | ICD-10-CM

## 2023-05-26 DIAGNOSIS — R0602 Shortness of breath: Secondary | ICD-10-CM | POA: Diagnosis present

## 2023-05-26 LAB — BASIC METABOLIC PANEL
Anion gap: 9 (ref 5–15)
BUN: 8 mg/dL (ref 6–20)
CO2: 26 mmol/L (ref 22–32)
Calcium: 9 mg/dL (ref 8.9–10.3)
Chloride: 103 mmol/L (ref 98–111)
Creatinine, Ser: 0.65 mg/dL (ref 0.61–1.24)
GFR, Estimated: >60 mL/min (ref >60)
Glucose, Bld: 119 mg/dL — ABNORMAL HIGH (ref 70–99)
Potassium: 3.9 mmol/L (ref 3.5–5.1)
Sodium: 138 mmol/L (ref 135–145)

## 2023-05-26 LAB — CBC WITH DIFFERENTIAL/PLATELET
Abs Immature Granulocytes: 0.03 10*3/uL (ref 0.00–0.07)
Basophils Absolute: 0.1 10*3/uL (ref 0.0–0.1)
Basophils Relative: 1 %
Eosinophils Absolute: 1 10*3/uL — ABNORMAL HIGH (ref 0.0–0.5)
Eosinophils Relative: 9 %
HCT: 44.8 % (ref 39.0–52.0)
Hemoglobin: 14.9 g/dL (ref 13.0–17.0)
Immature Granulocytes: 0 %
Lymphocytes Relative: 8 %
Lymphs Abs: 0.9 10*3/uL (ref 0.7–4.0)
MCH: 27.9 pg (ref 26.0–34.0)
MCHC: 33.3 g/dL (ref 30.0–36.0)
MCV: 83.7 fL (ref 80.0–100.0)
Monocytes Absolute: 0.4 10*3/uL (ref 0.1–1.0)
Monocytes Relative: 4 %
Neutro Abs: 8.4 10*3/uL — ABNORMAL HIGH (ref 1.7–7.7)
Neutrophils Relative %: 78 %
Platelets: 357 10*3/uL (ref 150–400)
RBC: 5.35 MIL/uL (ref 4.22–5.81)
RDW: 13.4 % (ref 11.5–15.5)
WBC: 10.7 10*3/uL — ABNORMAL HIGH (ref 4.0–10.5)
nRBC: 0 % (ref 0.0–0.2)

## 2023-05-26 LAB — BLOOD GAS, VENOUS
Acid-Base Excess: 6 mmol/L — ABNORMAL HIGH (ref 0.0–2.0)
Bicarbonate: 32.7 mmol/L — ABNORMAL HIGH (ref 20.0–28.0)
O2 Saturation: 92.4 %
Patient temperature: 36.1
pCO2, Ven: 52 mmHg (ref 44–60)
pH, Ven: 7.4 (ref 7.25–7.43)
pO2, Ven: 57 mmHg — ABNORMAL HIGH (ref 32–45)

## 2023-05-26 LAB — TROPONIN I (HIGH SENSITIVITY): Troponin I (High Sensitivity): 8 ng/L (ref <18)

## 2023-05-26 MED ORDER — ALBUTEROL SULFATE HFA 108 (90 BASE) MCG/ACT IN AERS
4.0000 | INHALATION_SPRAY | Freq: Once | RESPIRATORY_TRACT | Status: AC
  Administered 2023-05-26: 4 via RESPIRATORY_TRACT
  Filled 2023-05-26: qty 6.7

## 2023-05-26 MED ORDER — PREDNISONE 50 MG PO TABS: 50.0000 mg | ORAL_TABLET | Freq: Every day | ORAL | 0 refills | Status: DC

## 2023-05-26 MED ORDER — PREDNISONE 50 MG PO TABS: 50.0000 mg | ORAL_TABLET | Freq: Every day | ORAL | 0 refills | Status: AC

## 2023-05-26 MED ORDER — IPRATROPIUM-ALBUTEROL 0.5-2.5 (3) MG/3ML IN SOLN: 3.0000 mL | Freq: Once | RESPIRATORY_TRACT | Status: DC

## 2023-05-26 NOTE — ED Notes (Signed)
MD aware of pts c/o. Pt updated on plan of care

## 2023-05-26 NOTE — ED Triage Notes (Signed)
[facility]. EMS gave  him a duined, 125mg  of solumedrol, and 2g of magnesium sulfate.  BP 180/100 HR 101  SPO2 97-100% on 3L

## 2023-05-26 NOTE — ED Provider Notes (Signed)
[place] EMERGENCY DEPARTMENT AT [facility] Provider Note   CSN: [id number] Arrival date & time: 05/26/23  1842     History Chief Complaint  Patient presents with   Shortness of Breath    HPI Franklin Chavez is a 62 y.o. male presenting for acute on chronic shortness of breath.  Longstanding COPD, coming from jail with an exacerbation.  Frequent exacerbations.  Seen 2 weeks ago for similar.  States he did not want to get as sick as he was last time.  Denies fevers chills nausea vomiting syncope or chest pain.  Does endorse shortness of breath increased work of breathing..   Patient's recorded medical, surgical, social, medication list and allergies were reviewed in the Snapshot window as part of the initial history.   Review of Systems   Review of Systems  Constitutional:  Negative for chills and fever.  HENT:  Negative for ear pain and sore throat.   Eyes:  Negative for pain and visual disturbance.  Respiratory:  Positive for cough and shortness of breath. Negative for choking.   Cardiovascular:  Negative for chest pain and palpitations.  Gastrointestinal:  Negative for abdominal pain and vomiting.  Genitourinary:  Negative for dysuria and hematuria.  Musculoskeletal:  Negative for arthralgias and back pain.  Skin:  Negative for color change and rash.  Neurological:  Negative for seizures and syncope.  All other systems reviewed and are negative.   Physical Exam Updated Vital Signs BP (!) 165/98   Pulse (!) 105   Temp 98.4 F (36.9 C) (Oral)   Resp (!) 29   SpO2 91%  Physical Exam Vitals and nursing note reviewed.  Constitutional:      General: He is not in acute distress.    Appearance: He is well-developed.  HENT:     Head: Normocephalic and atraumatic.  Eyes:     Conjunctiva/sclera: Conjunctivae normal.  Cardiovascular:     Rate and Rhythm: Normal rate and regular rhythm.     Heart sounds: No murmur heard. Pulmonary:     Effort: Pulmonary  effort is normal. No respiratory distress.     Breath sounds: Wheezing present.  Abdominal:     Palpations: Abdomen is soft.     Tenderness: There is no abdominal tenderness.  Musculoskeletal:        General: No swelling.     Cervical back: Neck supple.  Skin:    General: Skin is warm and dry.     Capillary Refill: Capillary refill takes less than 2 seconds.  Neurological:     Mental Status: He is alert.  Psychiatric:        Mood and Affect: Mood normal.      ED Course/ Medical Decision Making/ A&P    Procedures Procedures   Medications Ordered in ED Medications  albuterol (VENTOLIN HFA) 108 (90 Base) MCG/ACT inhaler 4 puff (4 puffs Inhalation Given 05/26/23 2048)   Medical Decision Making:   BRAELYN BORDONARO is a 62 y.o. male with a history of COPD, who presented to the ED today with acute on chronic SOB. They are endorsing worsening of their baseline dyspnea over the past 72 hours. Their baseline is a 0L O2 requirement. At their baseline they are able to get around the neighborhood and they are not able to at this time.   On my initial exam, the pt was SOB and tachypneic. Audible wheezing and grossly decreased breath sounds appreciated.  They are endorsing increased sputum production.  Reviewed and confirmed nursing documentation for past medical history, family history, social history.    Initial Assessment:   With the patient's presentation of SOB in the above setting, most likely diagnosis is COPD Exacerbation. Other diagnoses were considered including (but not limited to) CAP, PE, ACS, viral infection, PTX. These are considered less likely due to history of present illness and physical exam findings.   This is most consistent with an acute life/limb threatening illness complicated by underlying chronic conditions.  Initial Plan:  On this evaluation, patient received extensive therapy from EMS prior to arrival.  Received 3 times DuoNebs, Solu-Medrol 125, magnesium 2 g  and is already had substantial improvement on my initial exam. Still some residual wheezing.  Will add on albuterol 4 puffs every 4 hours and reinitiate patient on prednisone p.o. Evaluation for ACS with EKG and delta troponin  Evaluation for infectious versus intrathoracic abnormality with chest x-ray  Evaluation for volume overload with BNP  Screening labs including CBC and Metabolic panel to evaluate for infectious or metabolic etiology of disease.  Patient's Wells score is low and patient does not warrant further objective evaluation for PE based on consistency of presentation of alternative diagnosis.  Objective evaluation as below reviewed   Initial Study Results:   Laboratory  All laboratory results reviewed without evidence of clinically relevant pathology.     EKG EKG was reviewed independently. Rate, rhythm, axis, intervals all examined and without medically relevant abnormality. ST segments without concerns for elevations.    Radiology:  All images reviewed independently. Agree with radiology report at this time.   DG Chest Portable 1 View  Result Date: 05/26/2023 CLINICAL DATA:  Shortness of breath EXAM: PORTABLE CHEST 1 VIEW COMPARISON:  Chest radiograph dated 05/06/2023 FINDINGS: Hyperexpanded lungs. Symmetric bilateral lucency. No focal consolidations. No pleural effusion or pneumothorax. The heart size and mediastinal contours are within normal limits. No acute osseous abnormality. IMPRESSION: 1. No active cardiopulmonary disease. 2. Findings in keeping with COPD. Electronically Signed   By: Agustin Cree M.D.   On: 05/26/2023 19:55   DG Chest Port 1 View  Result Date: 05/06/2023 CLINICAL DATA:  Shortness of breath.  Hypoxia. EXAM: PORTABLE CHEST 1 VIEW COMPARISON:  04/05/2023 FINDINGS: The heart size and mediastinal contours are within normal limits. Both lungs are clear. The visualized skeletal structures are unremarkable. IMPRESSION: Negative one view chest x-ray Electronically  Signed   By: Marin Roberts M.D.   On: 05/06/2023 16:21    Final Assessment and Plan:   After initiation of medical therapies, patient is grossly improved and no longer in acute distress.    Disposition:  I have considered need for hospitalization, however, considering all of the above, I believe this patient is stable for discharge at this time.  Patient/family educated about specific return precautions for given chief complaint and symptoms.  Patient/family educated about follow-up with PCP.     Patient/family expressed understanding of return precautions and need for follow-up. Patient spoken to regarding all imaging and laboratory results and appropriate follow up for these results. All education provided in verbal form with additional information in written form. Time was allowed for answering of patient questions. Patient discharged.    Emergency Department Medication Summary:   Medications  albuterol (VENTOLIN HFA) 108 (90 Base) MCG/ACT inhaler 4 puff (4 puffs Inhalation Given 05/26/23 2048)      Clinical Impression:  1. COPD exacerbation [facility])      Discharge   Final Clinical Impression(s) / ED  Diagnoses Final diagnoses:  COPD exacerbation (HCC)    Rx / DC Orders ED Discharge Orders          Ordered    predniSONE (DELTASONE) 50 MG tablet  Daily,   Status:  Discontinued        05/26/23 2257    predniSONE (DELTASONE) 50 MG tablet  Daily,   Status:  Discontinued        05/26/23 2302    predniSONE (DELTASONE) 50 MG tablet  Daily,   Status:  Discontinued        05/26/23 2303    predniSONE (DELTASONE) 50 MG tablet  Daily        05/26/23 2305              Glyn Ade, MD 05/26/23 2329

## 2023-05-26 NOTE — ED Notes (Signed)
Discharge papers reviewed with pt, pt ambulatory from ED in custody to return back to the jail

## 2023-05-26 NOTE — ED Notes (Signed)
Pt having labored breathing, MD placed pt on RA sating between 88-91%. Pt states "he is still struggling", MD aware. Ordered meds given at this time

## 2023-05-26 NOTE — Discharge Instructions (Addendum)
You are seen today for COPD exacerbation.  Your lab work is grossly reassuring.  Please continue your albuterol every 4 hours and prednisone for the next 5 days and follow-up with your primary care provider in the outpatient setting.

## 2023-08-20 ENCOUNTER — Other Ambulatory Visit (HOSPITAL_COMMUNITY): Payer: Self-pay | Admitting: Internal Medicine

## 2023-08-20 DIAGNOSIS — I2609 Other pulmonary embolism with acute cor pulmonale: Secondary | ICD-10-CM

## 2023-08-23 ENCOUNTER — Encounter: Payer: Self-pay | Admitting: Pulmonary Disease

## 2023-08-23 ENCOUNTER — Ambulatory Visit (INDEPENDENT_AMBULATORY_CARE_PROVIDER_SITE_OTHER): Admitting: Pulmonary Disease

## 2023-08-23 VITALS — BP 148/80 | HR 62 | Ht 75.0 in | Wt 173.2 lb

## 2023-08-23 DIAGNOSIS — J441 Chronic obstructive pulmonary disease with (acute) exacerbation: Secondary | ICD-10-CM | POA: Diagnosis not present

## 2023-08-23 DIAGNOSIS — F17200 Nicotine dependence, unspecified, uncomplicated: Secondary | ICD-10-CM | POA: Diagnosis not present

## 2023-08-23 LAB — CBC WITH DIFFERENTIAL/PLATELET
Basophils Absolute: 0 10*3/uL (ref 0.0–0.1)
Basophils Relative: 0.5 % (ref 0.0–3.0)
Eosinophils Absolute: 0.6 10*3/uL (ref 0.0–0.7)
Eosinophils Relative: 7.5 % — ABNORMAL HIGH (ref 0.0–5.0)
HCT: 44.5 % (ref 39.0–52.0)
Hemoglobin: 14.5 g/dL (ref 13.0–17.0)
Lymphocytes Relative: 22.6 % (ref 12.0–46.0)
Lymphs Abs: 1.9 10*3/uL (ref 0.7–4.0)
MCHC: 32.7 g/dL (ref 30.0–36.0)
MCV: 85.5 fl (ref 78.0–100.0)
Monocytes Absolute: 0.9 10*3/uL (ref 0.1–1.0)
Monocytes Relative: 11 % (ref 3.0–12.0)
Neutro Abs: 4.9 10*3/uL (ref 1.4–7.7)
Neutrophils Relative %: 58.4 % (ref 43.0–77.0)
Platelets: 281 10*3/uL (ref 150.0–400.0)
RBC: 5.2 Mil/uL (ref 4.22–5.81)
RDW: 15 % (ref 11.5–15.5)
WBC: 8.4 10*3/uL (ref 4.0–10.5)

## 2023-08-23 MED ORDER — TRELEGY ELLIPTA 100-62.5-25 MCG/ACT IN AEPB: 1.0000 | INHALATION_SPRAY | Freq: Every day | RESPIRATORY_TRACT | 11 refills | Status: DC

## 2023-08-23 MED ORDER — TRELEGY ELLIPTA 200-62.5-25 MCG/ACT IN AEPB: 1.0000 | INHALATION_SPRAY | Freq: Every day | RESPIRATORY_TRACT | 11 refills | Status: DC

## 2023-08-23 NOTE — Patient Instructions (Signed)
VISIT SUMMARY:  Dear Mr. Siew, during your recent visit, we discussed your ongoing breathing problems due to Chronic Obstructive Pulmonary Disease (COPD). Despite using multiple inhalers and prednisone, your symptoms have not stabilized. We have made some changes to your treatment plan to better manage your symptoms.  YOUR PLAN:  -CHRONIC OBSTRUCTIVE PULMONARY DISEASE (COPD): COPD is a lung disease that makes it hard to breathe and is often caused by long-term exposure to lung irritants, like smoking. We will continue your current medications, but replace your Tudorza inhaler with a Trelegy inhaler once daily for better symptom control. We will also order lung function tests in three months to confirm your diagnosis and assess the severity of your condition. Additionally, we will order annual CT scans of your chest for screening and blood work to rule out a genetic component given your family history of COPD.  -HYPERTENSION: Hypertension, or high blood pressure, was noted during our conversation. You should continue your current management as per your primary care provider.  -ANXIETY: Anxiety was also noted during our conversation. You should continue your current management as per your primary care provider.  INSTRUCTIONS:  Please follow up in three months for lung function tests and to assess your response to the new inhaler. Continue taking your current medications as prescribed, but replace your Tudorza inhaler with a Trelegy inhaler once daily.

## 2023-08-23 NOTE — Progress Notes (Signed)
Franklin Chavez    [id number]    02/04/1961  Primary Care Physician:Pcp, No  Referring Physician: Shanna Cisco, NP [address] [place],  [place] [address]  Chief complaint: Assessment for COPD  HPI: 62 y.o. who  has a past medical history of COPD (chronic obstructive pulmonary disease) (HCC), Essential hypertension, GAD (generalized anxiety disorder), Poor dentition (12/13/2018), and Tobacco use disorder (12/13/2018).   Discussed the use of AI scribe software for clinical note transcription with the patient, who gave verbal consent to proceed.  Franklin Chavez, a patient with a recent diagnosis of COPD, presents with ongoing breathing problems. Despite using an inhaler and prednisone 10mg  daily, his symptoms fluctuate and have not stabilized. He reports using multiple inhalers, including albuterol and Tudorza, both in the morning and evening. He also uses a nebulizer as needed. Despite these interventions, he has had multiple ED visits, often following a taper off of prednisone.  The patient was diagnosed with COPD in November of 2023 and has been on and off prednisone since then. Prior to this diagnosis, he had no known lung issues and had not been on any inhalers. He reports a 40-year history of smoking half a pack a day, but quit in March of 2019. He also reports a family history of COPD in a sister, who is on oxygen therapy.  Currently, he reports shortness of breath, cough, occasional wheezing, and mucus production of a yellow to light white grayish color. Despite these symptoms, he reports feeling "pretty good" and has not had any recent anxiety or COPD attacks.      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  Feather pillows or comforters Smoking history: Over 20-pack-year smoker.  Quit in March 2024 Travel history: No significant travel history Relevant family history: No significant family history of lung  disease   Outpatient Encounter Medications as of 08/23/2023  Medication Sig   albuterol (VENTOLIN HFA) 108 (90 Base) MCG/ACT inhaler Inhale 1-2 puffs into the lungs every 6 (six) hours as needed for wheezing or shortness of breath.   albuterol (VENTOLIN HFA) 108 (90 Base) MCG/ACT inhaler Inhale 1-2 puffs into the lungs every 6 (six) hours as needed for wheezing or shortness of breath.   amLODipine (NORVASC) 10 MG tablet Take 10 mg by mouth daily.   busPIRone (BUSPAR) 5 MG tablet Take 1 tablet (5 mg total) by mouth 2 (two) times daily.   fluticasone-salmeterol (ADVAIR DISKUS) 250-50 MCG/ACT AEPB Inhale 1 puff into the lungs in the morning and at bedtime.   guaiFENesin (MUCINEX) 600 MG 12 hr tablet Take 1 tablet (600 mg total) by mouth 2 (two) times daily.   ipratropium-albuterol (DUONEB) 0.5-2.5 (3) MG/3ML SOLN Take 3 mLs by nebulization every 6 (six) hours as needed.   TRELEGY ELLIPTA 100-62.5-25 MCG/ACT AEPB Inhale 1 puff into the lungs daily.   isosorbide mononitrate (IMDUR) 60 MG 24 hr tablet Take 1 tablet (60 mg total) by mouth daily. (Patient not taking: Reported on 05/06/2023)   losartan (COZAAR) 100 MG tablet Take 1 tablet (100 mg total) by mouth daily. (Patient not taking: Reported on 05/06/2023)   tiotropium (SPIRIVA HANDIHALER) 18 MCG inhalation capsule Place 1 capsule (18 mcg total) into inhaler and inhale daily.   No facility-administered encounter medications on file as of 08/23/2023.    Allergies as of 08/23/2023   (No Known Allergies)    Past Medical History:  Diagnosis Date  COPD (chronic obstructive pulmonary disease) (HCC)    Essential hypertension    GAD (generalized anxiety disorder)    Poor dentition 12/13/2018   Tobacco use disorder 12/13/2018    No past surgical history on file.  Family History  Problem Relation Age of Onset   Diabetes Mellitus II Mother    Hypertension Mother     Social History   Socioeconomic History   Marital status: Married    Spouse  name: Not on file   Number of children: Not on file   Years of education: Not on file   Highest education level: Not on file  Occupational History   Not on file  Tobacco Use   Smoking status: Every Day   Smokeless tobacco: Never  Substance and Sexual Activity   Alcohol use: No   Drug use: No   Sexual activity: Not on file  Other Topics Concern   Not on file  Social History Narrative   Not on file   Social Determinants of Health   Financial Resource Strain: Not on file  Food Insecurity: No Food Insecurity (05/07/2023)   Hunger Vital Sign    Worried About Running Out of Food in the Last Year: Never true    Ran Out of Food in the Last Year: Never true  Transportation Needs: No Transportation Needs (05/07/2023)   PRAPARE - Administrator, Civil Service (Medical): No    Lack of Transportation (Non-Medical): No  Physical Activity: Not on file  Stress: Not on file  Social Connections: Not on file  Intimate Partner Violence: Not At Risk (05/07/2023)   Humiliation, Afraid, Rape, and Kick questionnaire    Fear of Current or Ex-Partner: No    Emotionally Abused: No    Physically Abused: No    Sexually Abused: No    Review of systems: Review of Systems  Constitutional: Negative for fever and chills.  HENT: Negative.   Eyes: Negative for blurred vision.  Respiratory: as per HPI  Cardiovascular: Negative for chest pain and palpitations.  Gastrointestinal: Negative for vomiting, diarrhea, blood per rectum. Genitourinary: Negative for dysuria, urgency, frequency and hematuria.  Musculoskeletal: Negative for myalgias, back pain and joint pain.  Skin: Negative for itching and rash.  Neurological: Negative for dizziness, tremors, focal weakness, seizures and loss of consciousness.  Endo/Heme/Allergies: Negative for environmental allergies.  Psychiatric/Behavioral: Negative for depression, suicidal ideas and hallucinations.  All other systems reviewed and are  negative.  Physical Exam: Blood pressure (!) 148/80, pulse 62, height 6\' 3"  (1.905 m), weight 173 lb 3.2 oz (78.6 kg), SpO2 95%. Gen:      No acute distress HEENT:  EOMI, sclera anicteric Neck:     No masses; no thyromegaly Lungs:    Clear to auscultation bilaterally; normal respiratory effort CV:         Regular rate and rhythm; no murmurs Abd:      + bowel sounds; soft, non-tender; no palpable masses, no distension Ext:    No edema; adequate peripheral perfusion Skin:      Warm and dry; no rash Neuro: alert and oriented x 3 Psych: normal mood and affect  Data Reviewed: Imaging: CTA 10/29/2022-advanced paraseptal, centrilobular emphysema.  Lungs are clear.  I have reviewed the images personally.  PFTs:  Labs: CBC with differential 05/26/2023-WBC 10.7, eos 9%, absolute eosinophil count cell count 963   Assessment and Plan  Chronic Obstructive Pulmonary Disease (COPD) Diagnosed in November 2023. Multiple ED visits, often following cessation of prednisone.  Currently on prednisone 10mg  daily, Tudorza inhaler AM/PM, and albuterol nebulizer as needed. Reports improved symptoms with current regimen. Noted history of smoking for 40 years, quit in March 2019. Family history of COPD in sister. -Continue current medications. -Replace Tudorza inhaler with Trelegy inhaler once daily for better symptom control. -Order lung function tests in three months to confirm diagnosis and assess severity. -Order annual CT scans of the chest for screening. -Order blood work to rule out genetic component given family history of COPD.  Hypertension Noted in conversation but no details provided. -Continue current management as per primary care provider.  Anxiety Noted in conversation but no details provided. -Continue current management as per primary care provider.  Follow-up in three months for lung function tests and to assess response to new inhaler.      Recommendations: Check CBC with  differential, IgE, for antitrypsin levels and phenotype Change Tudorza to Trelegy Schedule PFTs, low-dose screening CT chest referral  Chilton Greathouse MD [facility] 08/23/2023, 8:55 AM  CC: Cullop, Adele Barthel, NP

## 2023-08-30 LAB — ALPHA-1 ANTITRYPSIN PHENOTYPE: ALPHA-1-ANTITRYPSIN (AAT) PHENOTYPE: 121 mg/dL (ref 83–199)

## 2023-08-30 LAB — IGE: IgE (Immunoglobulin E), Serum: 119 kU/L — ABNORMAL HIGH (ref <=114)

## 2023-09-03 ENCOUNTER — Ambulatory Visit (HOSPITAL_COMMUNITY): Attending: Cardiology

## 2023-09-03 DIAGNOSIS — I2609 Other pulmonary embolism with acute cor pulmonale: Secondary | ICD-10-CM | POA: Diagnosis present

## 2023-09-03 LAB — ECHOCARDIOGRAM COMPLETE
Area-P 1/2: 2.5 cm2
S' Lateral: 2.7 cm

## 2023-10-26 ENCOUNTER — Other Ambulatory Visit (HOSPITAL_COMMUNITY): Payer: Self-pay

## 2023-12-17 ENCOUNTER — Ambulatory Visit (INDEPENDENT_AMBULATORY_CARE_PROVIDER_SITE_OTHER): Payer: MEDICAID | Admitting: Pulmonary Disease

## 2023-12-17 DIAGNOSIS — J441 Chronic obstructive pulmonary disease with (acute) exacerbation: Secondary | ICD-10-CM | POA: Diagnosis not present

## 2023-12-17 LAB — PULMONARY FUNCTION TEST
DL/VA % pred: 64 %
DL/VA: 2.72 ml/min/mmHg/L
DLCO cor % pred: 43 %
DLCO cor: 12.23 ml/min/mmHg
DLCO unc % pred: 43 %
DLCO unc: 12.23 ml/min/mmHg
FEF 25-75 Post: 0.57 L/s
FEF 25-75 Pre: 0.47 L/s
FEF2575-%Change-Post: 21 %
FEF2575-%Pred-Post: 18 %
FEF2575-%Pred-Pre: 15 %
FEV1-%Change-Post: 16 %
FEV1-%Pred-Post: 35 %
FEV1-%Pred-Pre: 30 %
FEV1-Post: 1.3 L
FEV1-Pre: 1.11 L
FEV1FVC-%Change-Post: 6 %
FEV1FVC-%Pred-Pre: 54 %
FEV6-%Change-Post: 6 %
FEV6-%Pred-Post: 57 %
FEV6-%Pred-Pre: 54 %
FEV6-Post: 2.7 L
FEV6-Pre: 2.54 L
FEV6FVC-%Change-Post: -2 %
FEV6FVC-%Pred-Post: 96 %
FEV6FVC-%Pred-Pre: 99 %
FVC-%Change-Post: 9 %
FVC-%Pred-Post: 59 %
FVC-%Pred-Pre: 54 %
FVC-Post: 2.93 L
FVC-Pre: 2.68 L
Post FEV1/FVC ratio: 44 %
Post FEV6/FVC ratio: 92 %
Pre FEV1/FVC ratio: 41 %
Pre FEV6/FVC Ratio: 95 %
RV % pred: 214 %
RV: 4.98 L
TLC % pred: 114 %
TLC: 8.26 L

## 2023-12-17 NOTE — Patient Instructions (Signed)
 Full PFT Performed Today

## 2023-12-17 NOTE — Progress Notes (Signed)
 Full PFT Performed Today

## 2023-12-18 ENCOUNTER — Ambulatory Visit: Admitting: Pulmonary Disease

## 2023-12-18 ENCOUNTER — Encounter

## 2023-12-19 ENCOUNTER — Ambulatory Visit (INDEPENDENT_AMBULATORY_CARE_PROVIDER_SITE_OTHER): Payer: MEDICAID | Admitting: Pulmonary Disease

## 2023-12-19 ENCOUNTER — Encounter: Payer: Self-pay | Admitting: Pulmonary Disease

## 2023-12-19 VITALS — BP 140/88 | HR 63 | Temp 98.1°F | Ht 71.0 in | Wt 179.0 lb

## 2023-12-19 DIAGNOSIS — F17200 Nicotine dependence, unspecified, uncomplicated: Secondary | ICD-10-CM

## 2023-12-19 DIAGNOSIS — J4489 Other specified chronic obstructive pulmonary disease: Secondary | ICD-10-CM | POA: Diagnosis not present

## 2023-12-19 DIAGNOSIS — J439 Emphysema, unspecified: Secondary | ICD-10-CM | POA: Diagnosis not present

## 2023-12-19 MED ORDER — TRELEGY ELLIPTA 200-62.5-25 MCG/ACT IN AEPB: 1.0000 | INHALATION_SPRAY | Freq: Every day | RESPIRATORY_TRACT | 3 refills | Status: AC

## 2023-12-19 MED ORDER — TRELEGY ELLIPTA 200-62.5-25 MCG/ACT IN AEPB: 1.0000 | INHALATION_SPRAY | Freq: Every day | RESPIRATORY_TRACT | Status: AC

## 2023-12-19 NOTE — Patient Instructions (Signed)
 VISIT SUMMARY:  During today's visit, we discussed your worsening respiratory symptoms due to not having your usual inhaler, Trelegy. We also reviewed your smoking history and the need for regular lung cancer screenings.  YOUR PLAN:  -SEVERE COPD: Chronic Obstructive Pulmonary Disease (COPD) is a long-term lung condition that makes it hard to breathe. You have experienced worsening symptoms because you have not had your Trelegy inhaler. We have written a new prescription for Trelegy to help manage your symptoms.  -LUNG CANCER SCREENING: Given your significant smoking history, it is important to have annual CT scans to screen for lung cancer. We have ordered a CT scan for you and discussed the importance of this screening.  -SMOKING CESSATION: You have successfully stopped smoking for almost a year, which is excellent. We encourage you to continue staying smoke-free as it greatly benefits your lung health.  INSTRUCTIONS:  Please ensure you pick up your Trelegy prescription and start using it as directed. Schedule your annual CT scan for lung cancer screening as soon as possible. Continue to avoid smoking and maintain your smoke-free status.

## 2023-12-19 NOTE — Progress Notes (Signed)
 Franklin Chavez    [id number]    June 25, 1961  Primary Care Physician:Pcp, No  Referring Physician: No referring provider defined for this encounter.  Chief complaint: Follow-up for COPD  HPI: 62 y.o. who  has a past medical history of COPD (chronic obstructive pulmonary disease) (HCC), Essential hypertension, GAD (generalized anxiety disorder), Poor dentition (12/13/2018), and Tobacco use disorder (12/13/2018).   Discussed the use of AI scribe software for clinical note transcription with the patient, who gave verbal consent to proceed.  Mr. Bruney, a patient with a recent diagnosis of COPD, presents with ongoing breathing problems. Despite using an inhaler and prednisone  10mg  daily, his symptoms fluctuate and have not stabilized. He reports using multiple inhalers, including albuterol  and Tudorza, both in the morning and evening. He also uses a nebulizer as needed. Despite these interventions, he has had multiple ED visits, often following a taper off of prednisone .  The patient was diagnosed with COPD in November of 2023 and has been on and off prednisone  since then. Prior to this diagnosis, he had no known lung issues and had not been on any inhalers. He reports a 40-year history of smoking half a pack a day, but quit in March of 2019. He also reports a family history of COPD in a sister, who is on oxygen therapy.  Currently, he reports shortness of breath, cough, occasional wheezing, and mucus production of a yellow to light white grayish color. Despite these symptoms, he reports feeling "pretty good" and has not had any recent anxiety or COPD attacks.      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  Feather pillows or comforters Smoking history: Over 20-pack-year smoker.  Quit in March 2024 Travel history: No significant travel history Relevant family history: No significant family history of lung disease  Interim  history:  Discussed the use of AI scribe software for clinical note transcription with the patient, who gave verbal consent to proceed.  The patient, with a history of severe COPD, presents with worsening respiratory symptoms due to a lack of his usual inhaler, Trelegy. He reports that since he has been without it, he has had 'every problem in the world.' He has been requiring breathing treatments and has been struggling with his breathing. When he was taking Trelegy, he reports that he had 'no problem' and only needed to use it once a day.  The patient also has a significant smoking history, but reports that he has not smoked in almost a year. He has not been receiving annual CT scans as recommended given his smoking history. He reports having had a chest scan in 2023 and multiple chest x-rays last year.   Outpatient Encounter Medications as of 12/19/2023  Medication Sig   albuterol  (VENTOLIN  HFA) 108 (90 Base) MCG/ACT inhaler Inhale 1-2 puffs into the lungs every 6 (six) hours as needed for wheezing or shortness of breath.   amLODipine  (NORVASC ) 10 MG tablet Take 10 mg by mouth daily.   busPIRone  (BUSPAR ) 5 MG tablet Take 1 tablet (5 mg total) by mouth 2 (two) times daily.   guaiFENesin  (MUCINEX ) 600 MG 12 hr tablet Take 1 tablet (600 mg total) by mouth 2 (two) times daily.   ipratropium-albuterol  (DUONEB) 0.5-2.5 (3) MG/3ML SOLN Take 3 mLs by nebulization every 6 (six) hours as needed.   isosorbide  mononitrate (IMDUR ) 60 MG 24 hr tablet Take 1 tablet (60 mg total) by mouth daily.  losartan  (COZAAR ) 100 MG tablet Take 1 tablet (100 mg total) by mouth daily.   TRELEGY ELLIPTA  100-62.5-25 MCG/ACT AEPB Inhale 1 puff into the lungs daily.   Fluticasone -Umeclidin-Vilant (TRELEGY ELLIPTA ) 200-62.5-25 MCG/ACT AEPB Inhale 1 puff into the lungs daily. (Patient not taking: Reported on 12/19/2023)   [DISCONTINUED] albuterol  (VENTOLIN  HFA) 108 (90 Base) MCG/ACT inhaler Inhale 1-2 puffs into the lungs every  6 (six) hours as needed for wheezing or shortness of breath.   [DISCONTINUED] fluticasone -salmeterol (ADVAIR  DISKUS) 250-50 MCG/ACT AEPB Inhale 1 puff into the lungs in the morning and at bedtime.   [DISCONTINUED] tiotropium (SPIRIVA  HANDIHALER) 18 MCG inhalation capsule Place 1 capsule (18 mcg total) into inhaler and inhale daily.   No facility-administered encounter medications on file as of 12/19/2023.    Physical Exam: Blood pressure (!) 140/88, pulse 63, temperature 98.1 F (36.7 C), temperature source Oral, height 5\' 11"  (1.803 m), weight 179 lb (81.2 kg), SpO2 97%. Gen:      No acute distress HEENT:  EOMI, sclera anicteric Neck:     No masses; no thyromegaly Lungs:    Clear to auscultation bilaterally; normal respiratory effort CV:         Regular rate and rhythm; no murmurs Abd:      + bowel sounds; soft, non-tender; no palpable masses, no distension Ext:    No edema; adequate peripheral perfusion Skin:      Warm and dry; no rash Neuro: alert and oriented x 3 Psych: normal mood and affect   Data Reviewed: Imaging: CTA 10/29/2022-advanced paraseptal, centrilobular emphysema.  Lungs are clear.  I have reviewed the images personally. Chest x-ray 05/26/2023-hyperinflation I reviewed the images personally  PFTs: 12/17/2023 FVC 2.93 (59%], FEV1 1.30 [35%], F/F44, TLC 8.26 [114%], DLCO 12.23 [43%] Severe obstruction, diffusion defect  Labs: CBC with differential 05/26/2023-WBC 10.7, eos 9%, absolute eosinophil count cell count 963 CBC with differential 08/23/2023-WBC 8.4, eos 7.5%, absolute eosinophil count 630 IgE 08/23/2019 4-119 Alpha-1 antitrypsin 08/23/2023-121, PI MM  Assessment and Plan Chronic Obstructive Pulmonary Disease (COPD) Diagnosed in November 2023. Multiple ED visits, often following cessation of prednisone .  Noted history of smoking for 40 years, quit in March 2019. Family history of COPD in sister. Patient reports significant improvement in symptoms with  Trelegy, but has been off medication due to prescription issues. Lung sounds clear on examination.  -Write prescription for Trelegy and ensure patient receives it. -Order annual CT scans of the chest for screening.  Lung Cancer Screening Patient has significant smoking history but has not been receiving annual CT scans for lung cancer screening. -Order annual CT scan for lung cancer screening. -Explain the importance of this screening to the patient.  Hypertension Noted in conversation but no details provided. -Continue current management as per primary care provider.  Anxiety Noted in conversation but no details provided. -Continue current management as per primary care provider.  Smoking Cessation Patient reports cessation of smoking for almost a year. -Encourage continued abstinence from smoking.   Recommendations: Reorder Trelegy Lung cancer screening CT referral  Phyllis Breeze MD [facility] 12/19/2023, 10:51 AM  CC: No ref. provider found

## 2024-03-10 ENCOUNTER — Encounter: Admitting: Adult Health

## 2024-04-08 ENCOUNTER — Other Ambulatory Visit: Payer: Self-pay

## 2024-04-08 ENCOUNTER — Encounter: Payer: Self-pay | Admitting: Adult Health

## 2024-04-08 ENCOUNTER — Ambulatory Visit (INDEPENDENT_AMBULATORY_CARE_PROVIDER_SITE_OTHER): Payer: MEDICAID | Admitting: Adult Health

## 2024-04-08 DIAGNOSIS — Z87891 Personal history of nicotine dependence: Secondary | ICD-10-CM | POA: Diagnosis not present

## 2024-04-08 DIAGNOSIS — Z122 Encounter for screening for malignant neoplasm of respiratory organs: Secondary | ICD-10-CM

## 2024-04-08 NOTE — Progress Notes (Signed)
  Virtual Visit via Telephone Note  I connected with Franklin Chavez , 04/08/24 9:38 AM by a telemedicine application and verified that I am speaking with the correct person using two identifiers.  Location: Patient: home Provider: home   I discussed the limitations of evaluation and management by telemedicine and the availability of in person appointments. The patient expressed understanding and agreed to proceed.   Shared Decision Making Visit Lung Cancer Screening Program [phone])   Eligibility: 63 y.o. Pack Years Smoking History Calculation = 45 pack years  (# packs/per year x # years smoked) Recent History of coughing up blood  no Unexplained weight loss? no ( >Than 15 pounds within the last 6 months ) Prior History Lung / other cancer no (Diagnosis within the last 5 years already requiring surveillance chest CT Scans). Smoking Status Former Smoker Former Smokers: Years since quit: 1 year  Quit Date: 2024  Visit Components: Discussion included one or more decision making aids. YES Discussion included risk/benefits of screening. YES Discussion included potential follow up diagnostic testing for abnormal scans. YES Discussion included meaning and risk of over diagnosis. YES Discussion included meaning and risk of False Positives. YES Discussion included meaning of total radiation exposure. YES  Counseling Included: Importance of adherence to annual lung cancer LDCT screening. YES Impact of comorbidities on ability to participate in the program. YES Ability and willingness to under diagnostic treatment. YES  Smoking Cessation Counseling: Former Smokers:  Discussed the importance of maintaining cigarette abstinence. yes Diagnosis Code: Personal History of Nicotine  Dependence. U04.540 Information about tobacco cessation classes and interventions provided to patient. Yes Patient provided with "ticket" for LDCT Scan. yes Written Order for Lung Cancer Screening with LDCT  placed in Epic. Yes (CT Chest Lung Cancer Screening Low Dose W/O CM) [id number]  Z12.2-Screening of respiratory organs Z87.891-Personal history of nicotine  dependence   [name] 04/08/24

## 2024-04-08 NOTE — Patient Instructions (Signed)

## 2024-05-19 ENCOUNTER — Ambulatory Visit (HOSPITAL_COMMUNITY)
Admission: RE | Admit: 2024-05-19 | Discharge: 2024-05-19 | Disposition: A | Discharge status: Home / Self Care | Source: Ambulatory Visit | Attending: Internal Medicine | Admitting: Internal Medicine

## 2024-05-19 DIAGNOSIS — Z122 Encounter for screening for malignant neoplasm of respiratory organs: Secondary | ICD-10-CM | POA: Insufficient documentation

## 2024-05-19 DIAGNOSIS — Z87891 Personal history of nicotine dependence: Secondary | ICD-10-CM | POA: Diagnosis present

## 2024-05-30 ENCOUNTER — Other Ambulatory Visit: Payer: Self-pay | Admitting: Acute Care

## 2024-05-30 DIAGNOSIS — Z122 Encounter for screening for malignant neoplasm of respiratory organs: Secondary | ICD-10-CM

## 2024-05-30 DIAGNOSIS — Z87891 Personal history of nicotine dependence: Secondary | ICD-10-CM

## 2024-08-11 ENCOUNTER — Telehealth: Payer: Self-pay | Admitting: Pulmonary Disease

## 2024-08-11 NOTE — Telephone Encounter (Signed)
 Copied from CRM #[id number]. Topic: Appointments - Scheduling Inquiry for Clinic >> Aug 11, 2024 11:58 AM Corean SAUNDERS wrote: Reason for CRM: [place] from the [facility] is requesting on confirmation on when patient needs to have CT done as it has already been ordered as well as when he needs to come in for a follow up.   Please call [place] at [phone]

## 2024-08-12 NOTE — Telephone Encounter (Signed)
 Returned call and spoke with Franklin Chavez. Advised CT was completed 05/2024 and next annual scan will be due 05/21/2025. Advised patient can follow up as needed or annually as no specific f/u was mentioned in OV note.

## 2024-08-12 NOTE — Telephone Encounter (Signed)
 Routing to [facility] group. Thanks!

## 2024-08-22 ENCOUNTER — Emergency Department (HOSPITAL_COMMUNITY)

## 2024-08-22 ENCOUNTER — Emergency Department (HOSPITAL_COMMUNITY)
Admission: EM | Admit: 2024-08-22 | Discharge: 2024-08-22 | Disposition: A | Discharge status: Home / Self Care | Attending: Emergency Medicine | Admitting: Emergency Medicine

## 2024-08-22 ENCOUNTER — Other Ambulatory Visit: Payer: Self-pay

## 2024-08-22 DIAGNOSIS — I10 Essential (primary) hypertension: Secondary | ICD-10-CM | POA: Insufficient documentation

## 2024-08-22 DIAGNOSIS — R079 Chest pain, unspecified: Secondary | ICD-10-CM | POA: Insufficient documentation

## 2024-08-22 DIAGNOSIS — J449 Chronic obstructive pulmonary disease, unspecified: Secondary | ICD-10-CM | POA: Insufficient documentation

## 2024-08-22 DIAGNOSIS — Z79899 Other long term (current) drug therapy: Secondary | ICD-10-CM | POA: Diagnosis not present

## 2024-08-22 DIAGNOSIS — Z7951 Long term (current) use of inhaled steroids: Secondary | ICD-10-CM | POA: Insufficient documentation

## 2024-08-22 LAB — HEPATIC FUNCTION PANEL
ALT: 18 U/L (ref 0–44)
AST: 16 U/L (ref 15–41)
Albumin: 4 g/dL (ref 3.5–5.0)
Alkaline Phosphatase: 38 U/L (ref 38–126)
Bilirubin, Direct: 0.1 mg/dL (ref 0.0–0.2)
Indirect Bilirubin: 0.6 mg/dL (ref 0.3–0.9)
Total Bilirubin: 0.7 mg/dL (ref 0.0–1.2)
Total Protein: 7.3 g/dL (ref 6.5–8.1)

## 2024-08-22 LAB — CBC
HCT: 45 % (ref 39.0–52.0)
Hemoglobin: 15.1 g/dL (ref 13.0–17.0)
MCH: 28.5 pg (ref 26.0–34.0)
MCHC: 33.6 g/dL (ref 30.0–36.0)
MCV: 84.9 fL (ref 80.0–100.0)
Platelets: 242 K/uL (ref 150–400)
RBC: 5.3 MIL/uL (ref 4.22–5.81)
RDW: 13.8 % (ref 11.5–15.5)
WBC: 11.1 K/uL — ABNORMAL HIGH (ref 4.0–10.5)
nRBC: 0 % (ref 0.0–0.2)

## 2024-08-22 LAB — BASIC METABOLIC PANEL WITH GFR
Anion gap: 10 (ref 5–15)
BUN: 7 mg/dL — ABNORMAL LOW (ref 8–23)
CO2: 30 mmol/L (ref 22–32)
Calcium: 9.2 mg/dL (ref 8.9–10.3)
Chloride: 100 mmol/L (ref 98–111)
Creatinine, Ser: 0.91 mg/dL (ref 0.61–1.24)
GFR, Estimated: >60 mL/min (ref >60)
Glucose, Bld: 96 mg/dL (ref 70–99)
Potassium: 3.7 mmol/L (ref 3.5–5.1)
Sodium: 140 mmol/L (ref 135–145)

## 2024-08-22 LAB — TROPONIN I (HIGH SENSITIVITY)
Troponin I (High Sensitivity): 10 ng/L (ref <18)
Troponin I (High Sensitivity): 10 ng/L (ref <18)

## 2024-08-22 MED ORDER — IOHEXOL 350 MG/ML SOLN
100.0000 mL | Freq: Once | INTRAVENOUS | Status: AC | PRN
  Administered 2024-08-22: 100 mL via INTRAVENOUS

## 2024-08-22 NOTE — ED Triage Notes (Addendum)
 [facility]. Chest wall pain intermittent for 3 days. Hurts when he takes deep breath. Denies N/V/D. C/o 6/10 pain. 3 nitroglycerin 324 aspirin and albuterol  trx at [facility] with some relief.

## 2024-08-22 NOTE — ED Provider Notes (Addendum)
 [facility] AT [facility] Provider Note   CSN: [id number] Arrival date & time: 08/22/24  1226     Patient presents with: Chest Pain   Franklin Chavez is a 63 y.o. male with history of COPD, hypertension, aneurysm who presents with complaints of chest pain that started this morning while he was watching TV.  Pain is constant but worse with exertion.  It does not radiate is centrally located.  Does not extend to his back.  Does endorse associated shortness of breath.  No dizziness or nausea or vomiting.  He has no cardiac history.  He does report history of aneurysm in his chest.    Chest Pain     Past Medical History:  Diagnosis Date   COPD (chronic obstructive pulmonary disease) (HCC)    Essential hypertension    GAD (generalized anxiety disorder)    Poor dentition 12/13/2018   Tobacco use disorder 12/13/2018   No past surgical history on file.   Prior to Admission medications   Medication Sig Start Date End Date Taking? Authorizing Provider  albuterol  (VENTOLIN  HFA) 108 (90 Base) MCG/ACT inhaler Inhale 1-2 puffs into the lungs every 6 (six) hours as needed for wheezing or shortness of breath. 04/05/23   Elnor Jayson LABOR, DO  amLODipine  (NORVASC ) 10 MG tablet Take 10 mg by mouth daily.    [provider]  busPIRone  (BUSPAR ) 5 MG tablet Take 1 tablet (5 mg total) by mouth 2 (two) times daily. 11/07/22   Rai, Nydia POUR, MD  Fluticasone -Umeclidin-Vilant (TRELEGY ELLIPTA ) 200-62.5-25 MCG/ACT AEPB Inhale 1 puff into the lungs daily. 12/19/23 12/18/24  Mannam, Praveen, MD  Fluticasone -Umeclidin-Vilant (TRELEGY ELLIPTA ) 200-62.5-25 MCG/ACT AEPB Inhale 1 puff into the lungs daily. 12/19/23   Mannam, Praveen, MD  guaiFENesin  (MUCINEX ) 600 MG 12 hr tablet Take 1 tablet (600 mg total) by mouth 2 (two) times daily. 12/22/22   Jerri Keys, MD  ipratropium-albuterol  (DUONEB) 0.5-2.5 (3) MG/3ML SOLN Take 3 mLs by nebulization every 6 (six) hours as needed.  03/21/23   [provider]  isosorbide  mononitrate (IMDUR ) 60 MG 24 hr tablet Take 1 tablet (60 mg total) by mouth daily. 12/22/22   Jerri Keys, MD  losartan  (COZAAR ) 100 MG tablet Take 1 tablet (100 mg total) by mouth daily. 12/22/22   Jerri Keys, MD    Allergies: Patient has no known allergies.    Review of Systems  Cardiovascular:  Positive for chest pain.    Updated Vital Signs BP (!) 159/109   Pulse 65   Temp 98.2 F (36.8 C) (Oral)   Resp 16   Ht 5' 11 (1.803 m)   Wt 93 kg   SpO2 98%   BMI 28.59 kg/m   Physical Exam Vitals and nursing note reviewed.  Constitutional:      General: He is not in acute distress.    Appearance: He is well-developed.  HENT:     Head: Normocephalic and atraumatic.  Eyes:     Conjunctiva/sclera: Conjunctivae normal.  Cardiovascular:     Rate and Rhythm: Normal rate and regular rhythm.     Heart sounds: No murmur heard. Pulmonary:     Effort: Pulmonary effort is normal. No respiratory distress.     Breath sounds: Normal breath sounds.  Abdominal:     Palpations: Abdomen is soft.     Tenderness: There is no abdominal tenderness.  Musculoskeletal:        General: No swelling.     Cervical back:  Neck supple.  Skin:    General: Skin is warm and dry.     Capillary Refill: Capillary refill takes less than 2 seconds.  Neurological:     Mental Status: He is alert.  Psychiatric:        Mood and Affect: Mood normal.     (all labs ordered are listed, but only abnormal results are displayed) Labs Reviewed  BASIC METABOLIC PANEL WITH GFR - Abnormal; Notable for the following components:      Result Value   BUN 7 (*)    All other components within normal limits  CBC - Abnormal; Notable for the following components:   WBC 11.1 (*)    All other components within normal limits  HEPATIC FUNCTION PANEL  TROPONIN I (HIGH SENSITIVITY)  TROPONIN I (HIGH SENSITIVITY)    EKG: None  Radiology: CT Angio Chest/Abd/Pel for Dissection W  and/or Wo Contrast Result Date: 08/22/2024 CLINICAL DATA:  Chest pain, known 4.1 cm ascending thoracic aortic aneurysm EXAM: CT ANGIOGRAPHY CHEST, ABDOMEN AND PELVIS TECHNIQUE: Initial noncontrast chest CT was performed. Multidetector CT imaging through the chest, abdomen and pelvis was performed using the standard protocol during bolus administration of intravenous contrast. Multiplanar reconstructed images and MIPs were obtained and reviewed to evaluate the vascular anatomy. RADIATION DOSE REDUCTION: This exam was performed according to the departmental dose-optimization program which includes automated exposure control, adjustment of the mA and/or kV according to patient size and/or use of iterative reconstruction technique. CONTRAST:  OMNIPAQUE  IOHEXOL  350 MG/ML SOLN COMPARISON:  05/19/2024 FINDINGS: CTA CHEST FINDINGS Cardiovascular: Stable 4.1 cm ascending thoracic aorta aneurysm. No evidence of thoracic aortic dissection. Great vessels are widely patent. There is technically adequate opacification of the pulmonary vasculature, with no filling defects or pulmonary emboli identified. Dilated main pulmonary artery measuring 3.8 cm, consistent with pulmonary arterial hypertension. The heart is unremarkable without pericardial effusion. Stable coronary artery atherosclerosis. Mediastinum/Nodes: No enlarged mediastinal, hilar, or axillary lymph nodes. Thyroid gland, trachea, and esophagus demonstrate no significant findings. Lungs/Pleura: Stable upper lobe predominant emphysema. No acute airspace disease, effusion, or pneumothorax. The central airways are patent. Minimal retained secretions within the trachea. Musculoskeletal: No acute or destructive bony abnormalities. Reconstructed images demonstrate no additional findings. Review of the MIP images confirms the above findings. CTA ABDOMEN AND PELVIS FINDINGS VASCULAR Aorta: Normal caliber aorta without aneurysm, dissection, vasculitis or significant  stenosis. Mild atherosclerosis. Celiac: Patent without evidence of aneurysm, dissection, vasculitis or significant stenosis. SMA: Patent without evidence of aneurysm, dissection, vasculitis or significant stenosis. Renals: Both renal arteries are patent without evidence of aneurysm, dissection, vasculitis, fibromuscular dysplasia or significant stenosis. Mild asymmetric atheromatous plaque within the proximal right renal artery, with less than 50% stenosis. IMA: Patent without evidence of aneurysm, dissection, vasculitis or significant stenosis. Inflow: Patent without evidence of aneurysm, dissection, vasculitis or significant stenosis. Mild atherosclerosis. Veins: No obvious venous abnormality within the limitations of this arterial phase study. Review of the MIP images confirms the above findings. NON-VASCULAR Hepatobiliary: No focal liver abnormality is seen. No gallstones, gallbladder wall thickening, or biliary dilatation. Pancreas: Unremarkable. No pancreatic ductal dilatation or surrounding inflammatory changes. Spleen: Normal in size without focal abnormality. Adrenals/Urinary Tract: Adrenal glands are unremarkable. Kidneys are normal, without renal calculi, focal lesion, or hydronephrosis. Bladder is unremarkable. Stomach/Bowel: No bowel obstruction or ileus. Normal appendix right lower quadrant. No bowel wall thickening or inflammatory change. Lymphatic: No pathologic adenopathy within the abdomen or pelvis. Reproductive: Prostate is unremarkable. Other: No free fluid or  free intraperitoneal gas. Small fat containing umbilical hernia. Musculoskeletal: No acute or destructive bony abnormalities. Reconstructed images demonstrate no additional findings. Review of the MIP images confirms the above findings. IMPRESSION: Vascular: 1. Stable 4.1 cm ascending thoracic aortic aneurysm. Recommend annual imaging followup by CTA or MRA. This recommendation follows 2010 ACCF/AHA/AATS/ACR/ASA/SCA/SCAI/SIR/STS/SVM  Guidelines for the Diagnosis and Management of Patients with Thoracic Aortic Disease. Circulation. 2010; 121: Z733-z630. Aortic aneurysm NOS (ICD10-I71.9) 2. No evidence of aortic dissection. 3. No evidence of pulmonary embolus. 4. Enlarged main pulmonary artery, consistent with pulmonary arterial hypertension. 5. Aortic Atherosclerosis (ICD10-I70.0). Coronary artery atherosclerosis. Nonvascular: 1. No acute intrathoracic, intra-abdominal, or intrapelvic process. 2.  Emphysema (ICD10-J43.9). Electronically Signed   By: Ozell Daring M.D.   On: 08/22/2024 15:19   DG Chest 2 View Result Date: 08/22/2024 CLINICAL DATA:  Chest pain. EXAM: CHEST - 2 VIEW COMPARISON:  05/26/2023. FINDINGS: Bilateral lung fields are clear. Bilateral costophrenic angles are clear. Normal cardio-mediastinal silhouette. No acute osseous abnormalities. The soft tissues are within normal limits. IMPRESSION: No active cardiopulmonary disease. Electronically Signed   By: Ree Molt M.D.   On: 08/22/2024 13:58     Procedures   Medications Ordered in the ED  iohexol  (OMNIPAQUE ) 350 MG/ML injection 100 mL (100 mLs Intravenous Contrast Given 08/22/24 1457)    Clinical Course as of 08/22/24 1543  Fri Aug 22, 2024  1330 Patient with history of COPD, hypertension, aneurysm evaluated for complaints of chest pain that started this morning.  Is associated with shortness of breath.  Is exertional.  Patient is hypertensive 159/109.  His lung sounds are clear.  Abdomen is nontender.  He is otherwise resting comfortably.  Will initiate cardiac workup. [JT]  1358 Basic metabolic panel(!) Without significant abnormality [JT]  1358 Troponin I (High Sensitivity) Without elevation [JT]  1400 Hepatic function panel Unremarkable [JT]  1514 62 y.o. with chest pain starting at 9am. History of aortic aneurysm. Cardiac workup unremarkable. Follow up CTA [AF]  1515 CT Angio Chest/Abd/Pel for Dissection W and/or Wo Contrast [AF]  1542 CT Angio  Chest/Abd/Pel for Dissection W and/or Wo Contrast Stable without evidence of PE or dissection.  Overall workup is reassuring.  Patient is amenable to discharge with strict return precautions.  Patient agreement with plan. [JT]    Clinical Course User Index [AF] Dionisio Blunt, MD [JT] Donnajean Lynwood DEL, PA-C                                 Medical Decision Making Amount and/or Complexity of Data Reviewed Labs: ordered. Decision-making details documented in ED Course. Radiology: ordered. Decision-making details documented in ED Course.  Risk Prescription drug management.   This patient presents to the ED with chief complaint(s) of chest pain .  The complaint involves an extensive differential diagnosis and also carries with it a high risk of complications and morbidity.   Pertinent past medical history as listed in HPI  The differential diagnosis includes  Considered ACS however patient is without any EKG changes or troponin elevation.  CT is without evidence of PE or dissection.  Lung sounds are clear.  No infection symptoms.  Do not suspect pneumonia. Additional history obtained: Records reviewed Care Everywhere/External Records  Disposition:   Patient will be discharged home. The patient has been appropriately medically screened and/or stabilized in the ED. I have low suspicion for any other emergent medical condition which would require further screening, evaluation  or treatment in the ED or require inpatient management. At time of discharge the patient is hemodynamically stable and in no acute distress. I have discussed work-up results and diagnosis with patient and answered all questions. Patient is agreeable with discharge plan. We discussed strict return precautions for returning to the emergency department and they verbalized understanding.     Social Determinants of Health:   none  This note was dictated with voice recognition software.  Despite best efforts at  proofreading, errors may have occurred which can change the documentation meaning.       Final diagnoses:  Chest pain, unspecified type    ED Discharge Orders     None          Donnajean Lynwood DEL, PA-C 08/22/24 1516    Donnajean Lynwood DEL, PA-C 08/22/24 1543    Pamella Ozell LABOR, DO 08/25/24 1047

## 2024-08-22 NOTE — Discharge Instructions (Signed)
 You were evaluated in the emergency room for chest pain.  Your lab work and imaging did not show any significant abnormality.  Please follow-up with your primary care doctor for further evaluation.  If you experience any new or worsening symptoms please return to emergency room.
